# Patient Record
Sex: Female | Born: 1945 | Race: White | Hispanic: No | Smoking: Former smoker
Health system: Southern US, Community
[De-identification: ages and names within clinical notes are randomized; demographics above are authoritative.]

## PROBLEM LIST (undated history)

## (undated) DIAGNOSIS — R7303 Prediabetes: Secondary | ICD-10-CM

## (undated) DIAGNOSIS — E669 Obesity, unspecified: Secondary | ICD-10-CM

## (undated) DIAGNOSIS — I10 Essential (primary) hypertension: Secondary | ICD-10-CM

## (undated) DIAGNOSIS — Z860100 Personal history of colon polyps, unspecified: Secondary | ICD-10-CM

## (undated) DIAGNOSIS — T7840XA Allergy, unspecified, initial encounter: Secondary | ICD-10-CM

## (undated) DIAGNOSIS — M797 Fibromyalgia: Secondary | ICD-10-CM

## (undated) DIAGNOSIS — G894 Chronic pain syndrome: Secondary | ICD-10-CM

## (undated) DIAGNOSIS — E039 Hypothyroidism, unspecified: Secondary | ICD-10-CM

## (undated) DIAGNOSIS — N390 Urinary tract infection, site not specified: Secondary | ICD-10-CM

## (undated) DIAGNOSIS — M1990 Unspecified osteoarthritis, unspecified site: Secondary | ICD-10-CM

## (undated) HISTORY — DX: Obesity, unspecified: E66.9

## (undated) HISTORY — DX: Hypothyroidism, unspecified: E03.9

## (undated) HISTORY — DX: Essential (primary) hypertension: I10

## (undated) HISTORY — DX: Allergy, unspecified, initial encounter: T78.40XA

## (undated) HISTORY — DX: Personal history of colonic polyps: Z86.010

## (undated) HISTORY — DX: Unspecified osteoarthritis, unspecified site: M19.90

## (undated) HISTORY — DX: Urinary tract infection, site not specified: N39.0

## (undated) HISTORY — DX: Personal history of colon polyps, unspecified: Z86.0100

## (undated) HISTORY — DX: Chronic pain syndrome: G89.4

## (undated) HISTORY — DX: Fibromyalgia: M79.7

---
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
## 1980-03-03 HISTORY — PX: CHOLECYSTECTOMY: SHX55

## 1983-03-04 HISTORY — PX: TUBAL LIGATION: SHX77

## 1988-03-03 HISTORY — PX: ABDOMINAL HYSTERECTOMY: SHX81

## 1988-03-03 HISTORY — PX: DILATION AND CURETTAGE OF UTERUS: SHX78

## 1990-03-03 HISTORY — PX: KNEE ARTHROSCOPY: SHX127

## 1990-03-03 HISTORY — PX: FOOT SURGERY: SHX648

## 1997-10-16 ENCOUNTER — Ambulatory Visit (HOSPITAL_COMMUNITY): Admission: RE | Admit: 1997-10-16 | Discharge: 1997-10-16 | Payer: Self-pay | Admitting: Gastroenterology

## 1998-08-27 ENCOUNTER — Encounter: Payer: Self-pay | Admitting: Family Medicine

## 1998-08-27 ENCOUNTER — Ambulatory Visit (HOSPITAL_COMMUNITY): Admission: RE | Admit: 1998-08-27 | Discharge: 1998-08-27 | Payer: Self-pay | Admitting: Family Medicine

## 1999-06-19 ENCOUNTER — Encounter: Payer: Self-pay | Admitting: Sports Medicine

## 1999-06-19 ENCOUNTER — Encounter: Admission: RE | Admit: 1999-06-19 | Discharge: 1999-06-19 | Payer: Self-pay | Admitting: Sports Medicine

## 2000-02-06 ENCOUNTER — Inpatient Hospital Stay: Admission: EM | Admit: 2000-02-06 | Discharge: 2000-02-09 | Payer: Self-pay | Admitting: Family Medicine

## 2000-08-17 ENCOUNTER — Encounter: Admission: RE | Admit: 2000-08-17 | Discharge: 2000-08-17 | Payer: Self-pay | Admitting: Infectious Diseases

## 2000-09-23 ENCOUNTER — Encounter: Admission: RE | Admit: 2000-09-23 | Discharge: 2000-09-23 | Payer: Self-pay | Admitting: Infectious Diseases

## 2002-07-07 ENCOUNTER — Ambulatory Visit (HOSPITAL_COMMUNITY): Admission: RE | Admit: 2002-07-07 | Discharge: 2002-07-07 | Payer: Self-pay | Admitting: Cardiology

## 2006-01-08 ENCOUNTER — Ambulatory Visit (HOSPITAL_COMMUNITY): Admission: RE | Admit: 2006-01-08 | Discharge: 2006-01-08 | Payer: Self-pay | Admitting: Gastroenterology

## 2006-02-06 ENCOUNTER — Ambulatory Visit (HOSPITAL_BASED_OUTPATIENT_CLINIC_OR_DEPARTMENT_OTHER): Admission: RE | Admit: 2006-02-06 | Discharge: 2006-02-06 | Payer: Self-pay | Admitting: Urology

## 2009-01-15 ENCOUNTER — Encounter: Admission: RE | Admit: 2009-01-15 | Discharge: 2009-01-15 | Payer: Self-pay | Admitting: Endocrinology

## 2009-03-04 ENCOUNTER — Ambulatory Visit: Payer: Self-pay | Admitting: Family Medicine

## 2009-03-04 LAB — CONVERTED CEMR LAB
Bilirubin Urine: NEGATIVE
Blood in Urine, dipstick: NEGATIVE
Glucose, Urine, Semiquant: NEGATIVE
Ketones, urine, test strip: NEGATIVE
Nitrite: NEGATIVE
Protein, U semiquant: NEGATIVE
Specific Gravity, Urine: 1.015
Urobilinogen, UA: 0.2
pH: 5.5

## 2009-04-30 ENCOUNTER — Encounter: Payer: Self-pay | Admitting: Internal Medicine

## 2009-05-15 ENCOUNTER — Ambulatory Visit: Payer: Self-pay | Admitting: Internal Medicine

## 2009-05-15 DIAGNOSIS — Z860100 Personal history of colon polyps, unspecified: Secondary | ICD-10-CM | POA: Insufficient documentation

## 2009-05-15 DIAGNOSIS — E039 Hypothyroidism, unspecified: Secondary | ICD-10-CM | POA: Insufficient documentation

## 2009-05-15 DIAGNOSIS — J309 Allergic rhinitis, unspecified: Secondary | ICD-10-CM | POA: Insufficient documentation

## 2009-05-15 DIAGNOSIS — I10 Essential (primary) hypertension: Secondary | ICD-10-CM | POA: Insufficient documentation

## 2009-05-15 DIAGNOSIS — R300 Dysuria: Secondary | ICD-10-CM | POA: Insufficient documentation

## 2009-05-15 LAB — CONVERTED CEMR LAB
Bilirubin Urine: NEGATIVE
Glucose, Urine, Semiquant: NEGATIVE
Ketones, urine, test strip: NEGATIVE
Nitrite: NEGATIVE
Specific Gravity, Urine: 1.025
Urobilinogen, UA: 0.2
pH: 5

## 2009-05-28 ENCOUNTER — Telehealth: Payer: Self-pay | Admitting: Internal Medicine

## 2010-03-24 ENCOUNTER — Encounter: Payer: Self-pay | Admitting: Family Medicine

## 2010-04-04 NOTE — Assessment & Plan Note (Signed)
Summary: Frequent, burning urination x 8 dys rm2   Vital Signs:  Patient Profile:   65 Years Old Female CC:      Frequent, burning  urination x 8 dys  Height:     67 inches Weight:      251 pounds O2 Sat:      100 % O2 treatment:    Room Air Temp:     97 degrees F oral Pulse rate:   76 / minute Pulse rhythm:   regular Resp:     16 per minute BP sitting:   144 / 87  (right arm) Cuff size:   regular  Vitals Entered By: Areta Haber CMA (March 04, 2009 3:56 PM)                  Prior Medication List:  No prior medications documented  Current Allergies: No known allergies History of Present Illness Chief Complaint: Frequent, burning  urination x 8 dys  History of Present Illness: Was seen by her PCP just before Christmas. She reports no significant improvement. Only w. taking Azo does the symptoms get better at all. She has used cipro before but usually needs a full 7-10 days of treatment when shee does that.  Current Problems: URINARY TRACT INFECTION (ICD-599.0)   Current Meds OXYCONTIN 40 MG XR12H-TAB (OXYCODONE HCL) 2 tab by mouth once daily ARMOUR THYROID 90 MG TABS (THYROID) 1 tab by mouth once daily MICARDIS 20 MG TABS (TELMISARTAN) 1 tab by mouth once daily CIPRO 500 MG TABS (CIPROFLOXACIN HCL) 1 by mouth 2 times daily PYRIDIUM 100 MG TABS (PHENAZOPYRIDINE HCL) 1 po 3x aday  REVIEW OF SYSTEMS Constitutional Symptoms      Denies fever, chills, night sweats, weight loss, weight gain, and fatigue.  Eyes       Denies change in vision, eye pain, eye discharge, glasses, contact lenses, and eye surgery. Ear/Nose/Throat/Mouth       Denies hearing loss/aids, change in hearing, ear pain, ear discharge, dizziness, frequent runny nose, frequent nose bleeds, sinus problems, sore throat, hoarseness, and tooth pain or bleeding.  Respiratory       Denies dry cough, productive cough, wheezing, shortness of breath, asthma, bronchitis, and emphysema/COPD.  Cardiovascular       Denies murmurs, chest pain, and tires easily with exhertion.    Gastrointestinal       Denies stomach pain, nausea/vomiting, diarrhea, constipation, blood in bowel movements, and indigestion. Genitourniary       Complains of painful urination.      Denies kidney stones and loss of urinary control.      Comments: Frequent x 8 dys Neurological       Denies paralysis, seizures, and fainting/blackouts. Musculoskeletal       Denies muscle pain, joint pain, joint stiffness, decreased range of motion, redness, swelling, muscle weakness, and gout.  Skin       Denies bruising, unusual mles/lumps or sores, and hair/skin or nail changes.  Psych       Denies mood changes, temper/anger issues, anxiety/stress, speech problems, depression, and sleep problems. Other Comments: Pt has not followed up with PCP.   Past History:  Family History: Last updated: 03/04/2009 Family History Hypertension Family History Thyroid disease  Social History: Last updated: 03/04/2009 Married Never Smoked Alcohol use-no Drug use-no Regular exercise-no  Risk Factors: Exercise: no (03/04/2009)  Risk Factors: Smoking Status: never (03/04/2009)  Past Medical History: Fibromyalgis Arthritis  Past Surgical History: Caesarean section Cholecystectomy Tonsillectomy Foot,  knee  Appendectomy Hysterectomy  Family History: Reviewed history and no changes required. Family History Hypertension Family History Thyroid disease  Social History: Reviewed history and no changes required. Married Never Smoked Alcohol use-no Drug use-no Regular exercise-no Smoking Status:  never Drug Use:  no Does Patient Exercise:  no Physical Exam General appearance: well developed, well nourished, no acute distress Abdomen: just mild tenderness over the bladder Skin: no obvious rashes or lesions MSE: oriented to time, place, and person Assessment New Problems: URINARY TRACT INFECTION  (ICD-599.0)  UTI  Plan New Medications/Changes: PYRIDIUM 100 MG TABS (PHENAZOPYRIDINE HCL) 1 po 3x aday  #15 x 0, 03/04/2009, Hassan Rowan MD CIPRO 500 MG TABS (CIPROFLOXACIN HCL) 1 by mouth 2 times daily  #14 x 0, 03/04/2009, Hassan Rowan MD  New Orders: New Patient Level III [phone] UA Dipstick w/o Micro (manual) [81002] T-Culture, Urine [[id number]] Planning Comments:   AS BELOW  Follow Up: Follow up in 2-3 days if no improvement, Follow up on an as needed basis, Follow up with Primary Physician  The patient and/or caregiver has been counseled thoroughly with regard to medications prescribed including dosage, schedule, interactions, rationale for use, and possible side effects and they verbalize understanding.  Diagnoses and expected course of recovery discussed and will return if not improved as expected or if the condition worsens. Patient and/or caregiver verbalized understanding.  Prescriptions: PYRIDIUM 100 MG TABS (PHENAZOPYRIDINE HCL) 1 po 3x aday  #15 x 0   Entered and Authorized by:   Hassan Rowan MD   Signed by:   Hassan Rowan MD on 03/04/2009   Method used:   Print then Give to Patient   RxID:   [id number] CIPRO 500 MG TABS (CIPROFLOXACIN HCL) 1 by mouth 2 times daily  #14 x 0   Entered and Authorized by:   Hassan Rowan MD   Signed by:   Hassan Rowan MD on 03/04/2009   Method used:   Print then Give to Patient   RxID:   [id number]   Patient Instructions: 1)  Please schedule a follow-up appointment as needed. 2)  Please schedule an appointment with your primary doctor in :7-10 DAYS 3)  Take your antibiotic as prescribed until ALL of it is gone, but stop if you develop a rash or swelling and contact our office as soon as possible.  Laboratory Results   Urine Tests  Date/Time Received: March 04, 2009 4:37 PM  Date/Time Reported: March 04, 2009 4:37 PM   Routine Urinalysis   Color: yellow Appearance: Clear Glucose: negative   (Normal Range:  Negative) Bilirubin: negative   (Normal Range: Negative) Ketone: negative   (Normal Range: Negative) Spec. Gravity: 1.015   (Normal Range: 1.003-1.035) Blood: negative   (Normal Range: Negative) pH: 5.5   (Normal Range: 5.0-8.0) Protein: negative   (Normal Range: Negative) Urobilinogen: 0.2   (Normal Range: 0-1) Nitrite: negative   (Normal Range: Negative) Leukocyte Esterace: small   (Normal Range: Negative)

## 2010-04-04 NOTE — Assessment & Plan Note (Signed)
Summary: to be est/njr   Vital Signs:  Patient profile:   65 year old female Height:      66.5 inches Weight:      250 pounds BMI:     39.89 Temp:     97.9 degrees F oral BP sitting:   110 / 78  (left arm) Cuff size:   large  Vitals Entered By: Duard Brady LPN (May 15, 2009 9:38 AM) CC: new pt to establish-c/o bruning with urination   Hx UTI   . last pap 2010 Norn , last mammo 2009 [facility].  Is Patient Diabetic? No   CC:  new pt to establish-c/o bruning with urination   Hx UTI   . last pap 2010 Norn  and last mammo 2009 [facility]. Marland Kitchen  History of Present Illness: 65 year old patient who is seen today to establish with our practice.  She is followed by pain management for fibromyalgia and osteoarthritis.  She has a history of treated hypertension, hypothyroidism, obesity, osteoporosis.  Preventive Screening-Counseling & Management  Alcohol-Tobacco     Smoking Status: never  Allergies (verified): No Known Drug Allergies  Past History:  Past Medical History: Fibromyalgis/chronic pain syndrome ([place]) Arthritis ([place]) Colonic polyps, hx of Hypertension obesity chronic UTI -urology Allergic rhinitis Hypothyroidism  Past Surgical History: Caesarean section Cholecystectomy 83 Tonsillectomy 60 Foot,  knee 89 Appendectomy 66 Hysterectomy 89 colonoscopy 2007  Family History: Reviewed history from 03/04/2009 and no changes required. Family History Hypertension Family History Thyroid disease  mother died age 53, pancreatic cancer father died age 39 hematologic malignancy  Two sisters unremarkable health except for arthritis  Social History: Reviewed history from 03/04/2009 and no changes required. Married Never Smoked-DC tobacco, age 65 Alcohol use-no Drug use-no Regular exercise-no 6 children, 21 grandchildren  Review of Systems       The patient complains of weight gain and difficulty walking.  The patient denies anorexia, fever, weight loss, vision loss,  decreased hearing, hoarseness, chest pain, syncope, dyspnea on exertion, peripheral edema, prolonged cough, headaches, hemoptysis, abdominal pain, melena, hematochezia, severe indigestion/heartburn, hematuria, incontinence, genital sores, muscle weakness, suspicious skin lesions, transient blindness, depression, unusual weight change, abnormal bleeding, enlarged lymph nodes, angioedema, and breast masses.    Physical Exam  General:  overweight-appearing.  140/80 Head:  Normocephalic and atraumatic without obvious abnormalities. No apparent alopecia or balding. Eyes:  No corneal or conjunctival inflammation noted. EOMI. Perrla. Funduscopic exam benign, without hemorrhages, exudates or papilledema. Vision grossly normal. Ears:  External ear exam shows no significant lesions or deformities.  Otoscopic examination reveals clear canals, tympanic membranes are intact bilaterally without bulging, retraction, inflammation or discharge. Hearing is grossly normal bilaterally. Nose:  External nasal examination shows no deformity or inflammation. Nasal mucosa are pink and moist without lesions or exudates. Mouth:  Oral mucosa and oropharynx without lesions or exudates.  Teeth in good repair. Neck:  No deformities, masses, or tenderness noted. Chest Brandis:  No deformities, masses, or tenderness noted. Lungs:  Normal respiratory effort, chest expands symmetrically. Lungs are clear to auscultation, no crackles or wheezes. Heart:  Normal rate and regular rhythm. S1 and S2 normal without gallop, murmur, click, rub or other extra sounds. Abdomen:  Bowel sounds positive,abdomen soft and non-tender without masses, organomegaly or hernias noted. Msk:  No deformity or scoliosis noted of thoracic or lumbar spine.   Pulses:  R and L carotid,radial,femoral,dorsalis pedis and posterior tibial pulses are full and equal bilaterally Extremities:  No clubbing, cyanosis, edema, or deformity noted with normal  full range of motion  of all joints.   Neurologic:  alert & oriented X3, strength normal in all extremities, sensation intact to pinprick, and gait normal.   Skin:  Intact without suspicious lesions or rashes Cervical Nodes:  No lymphadenopathy noted Axillary Nodes:  No palpable lymphadenopathy Inguinal Nodes:  No significant adenopathy Psych:  Cognition and judgment appear intact. Alert and cooperative with normal attention span and concentration. No apparent delusions, illusions, hallucinations   Impression & Recommendations:  Problem # 1:  HYPOTHYROIDISM (ICD-244.9)  Her updated medication list for this problem includes:    Armour Thyroid 90 Mg Tabs (Thyroid) .Marland Kitchen... 1 tab by mouth once daily  Problem # 2:  HYPERTENSION (ICD-401.9)  The following medications were removed from the medication list:    Micardis 20 Mg Tabs (Telmisartan) .Marland Kitchen... 1 tab by mouth once daily Her updated medication list for this problem includes:    Micardis 40 Mg Tabs (Telmisartan) .Marland Kitchen... 1/2 daily  Problem # 3:  COLONIC POLYPS, HX OF (ICD-V12.72)  Problem # 4:  DYSURIA (ICD-788.1)  Orders: UA Dipstick w/o Micro (manual) (16109)  Her updated medication list for this problem includes:    Ciprofloxacin Hcl 500 Mg Tabs (Ciprofloxacin hcl) ..... One twice daily  Complete Medication List: 1)  Oxycontin 40 Mg Xr12h-tab (Oxycodone hcl) .... 2 tab by mouth once daily 2)  Armour Thyroid 90 Mg Tabs (Thyroid) .Marland Kitchen.. 1 tab by mouth once daily 3)  Pyridium 100 Mg Tabs (Phenazopyridine hcl) .Marland Kitchen.. 1 po 3x aday 4)  Ciprofloxacin Hcl 500 Mg Tabs (Ciprofloxacin hcl) .... One twice daily 5)  Micardis 40 Mg Tabs (Telmisartan) .... 1/2 daily  Patient Instructions: 1)  Limit your Sodium (Salt). 2)  It is important that you exercise regularly at least 20 minutes 5 times a week. If you develop chest pain, have severe difficulty breathing, or feel very tired , stop exercising immediately and seek medical attention. 3)  You need to lose weight.  Consider a lower calorie diet and regular exercise.  4)  Check your Blood Pressure regularly. If it is above: 150/90 you should make an appointment. 5)  Please schedule a follow-up appointment in 4 months. Prescriptions: MICARDIS 40 MG TABS (TELMISARTAN) 1/2 daily  #90 x 6   Entered and Authorized by:   Gordy Savers  MD   Signed by:   Gordy Savers  MD on 05/15/2009   Method used:   Print then Give to Patient   RxID:   [id number] PYRIDIUM 100 MG TABS (PHENAZOPYRIDINE HCL) 1 po 3x aday  #15 x 0   Entered and Authorized by:   Gordy Savers  MD   Signed by:   Gordy Savers  MD on 05/15/2009   Method used:   Print then Give to Patient   RxID:   [id number] ARMOUR THYROID 90 MG TABS (THYROID) 1 tab by mouth once daily  #90 x 6   Entered and Authorized by:   Gordy Savers  MD   Signed by:   Gordy Savers  MD on 05/15/2009   Method used:   Print then Give to Patient   RxID:   [id number] MICARDIS 40 MG TABS (TELMISARTAN) 1/2 daily  #90 x 6   Entered and Authorized by:   Gordy Savers  MD   Signed by:   Gordy Savers  MD on 05/15/2009   Method used:   [facility] to        [facility]  [address] [phone]* (retail)       [address] [place], [place]  [address]       Ph: [phone] or [phone]       Fax: [phone]   RxID:   [id number] CIPROFLOXACIN HCL 500 MG TABS (CIPROFLOXACIN HCL) one twice daily  #14 x 0   Entered and Authorized by:   Gordy Savers  MD   Signed by:   Gordy Savers  MD on 05/15/2009   Method used:   Print then Give to Patient   RxID:   [id number] PYRIDIUM 100 MG TABS (PHENAZOPYRIDINE HCL) 1 po 3x aday  #15 x 0   Entered and Authorized by:   Gordy Savers  MD   Signed by:   Gordy Savers  MD on 05/15/2009   Method used:   [facility]ically to        [facility] [phone]* (retail)       [address] [place], [place]  [address]       Ph: [phone] or  [phone]       Fax: [phone]   RxID:   [id number] ARMOUR THYROID 90 MG TABS (THYROID) 1 tab by mouth once daily  #90 x 6   Entered and Authorized by:   Gordy Savers  MD   Signed by:   Gordy Savers  MD on 05/15/2009   Method used:   [facility]ically to        [facility] [phone]* (retail)       [address] [place], [place]  [address]       Ph: [phone] or [phone]       Fax: [phone]   RxID:   [id number]   Laboratory Results   Urine Tests  Date/Time Received: May 15, 2009 9:44 AM Date/Time Reported: May 15, 2009 9:44 AM  Routine Urinalysis   Color: yellow Appearance: Cloudy Glucose: negative   (Normal Range: Negative) Bilirubin: negative   (Normal Range: Negative) Ketone: negative   (Normal Range: Negative) Spec. Gravity: 1.025   (Normal Range: 1.003-1.035) Blood: moderate   (Normal Range: Negative) pH: 5.0   (Normal Range: 5.0-8.0) Protein: trace   (Normal Range: Negative) Urobilinogen: 0.2   (Normal Range: 0-1) Nitrite: negative   (Normal Range: Negative) Leukocyte Esterace: moderate   (Normal Range: Negative)

## 2010-04-04 NOTE — Progress Notes (Signed)
Summary:  uti - cipro rx'd       Additional Follow-up for Phone Call Additional follow up Details #2::    called pt at home # - still with c/o uti , states she has appt with urology on 4/14 , in the past has had to be on abx tx for 2 weeks . I will infom Dr.kwaitkowski and see if he wants to call out something or does she need to be seen . Pt said she could not come in for an appt. KIK Follow-up by: Duard Brady LPN,  May 28, 2009 11:36 AM  Additional Follow-up for Phone Call Additional follow up Details #3:: Details for Additional Follow-up Action Taken: spoke with dr. Amador Cunas - ok'd refill on cipro - [facility] [place] - pt not at home - spoke with husband to let know rx filled. KIK Additional Follow-up by: Duard Brady LPN,  May 28, 2009 2:45 PM  Prescriptions: CIPROFLOXACIN HCL 500 MG TABS (CIPROFLOXACIN HCL) one twice daily  #14 x 1   Entered by:   Duard Brady LPN   Authorized by:   Gordy Savers  MD   Signed by:   Duard Brady LPN on 29/56/2130   Method used:   [facility] to        [facility] [phone]* (retail)       [address] [place], [place]  [address]       Ph: [phone] or [phone]       Fax: [phone]   RxID:   [id number]    [facility] Triage Call Report Triage Record Num: [id number] Operator: Coralee [place] Patient Name: Jajaira Ruis Call Date & Time: 05/25/2009 7:06:33PM Patient Phone: PCP: Patient Gender: Female PCP Fax : Patient DOB: 1945/10/15 [facility] Name: [facility] Reason for Call: Pt calling tonight about completing Cipro for UTI. About 2 days after finishing the antibiotic the symptoms returned. Pt is unable to supply any dates for time frame of being off Cipro. She is requesting to be put back on Cipro. UC is available. Pt advised to be seen at [facility]. Pt sts"You should have told me that you were'nt going to call in Cipro before I wasted my time talking to you". RN apologized  for her inconvenience and pt disconnected the call before further advise could be given. Protocol(s) Used: Urinary Symptoms - Female Recommended Outcome per Protocol: See Provider within 24 hours Reason for Outcome: Urinary tract symptoms Care Advice: Increase intake of fluids. Try to drink 8 oz. (.2 liter) every hour when awake, including unsweetened cranberry juice, unless on restricted fluids for other medical reasons. Take sips of fluid or eat ice chips if nauseated or vomiting.  ~ Systemic Inflammatory Response Syndrome (SIRS): Watch for signs of a generalized, whole body infection. Occurs within days of a localized infection, especially of the urinary, GI, respiratory or nervous systems; or after a traumatic injury or invasive procedure. - Call EMS 911 if symptoms have worsened, such as increasing confusion or unusual drowsiness; cold and clammy skin; no urine output; rapid respiration (>30/min.) or slow respiration (<10/min.); struggling to breathe. - Go to the ED immediately for early symptoms of rapid pulse >90/min. or rapid breathing >20/min. at rest; chills; oral temperature >100.4 F (38 C) or <96.8 F (36 C) when associated with conditions noted.  ~  ~ SYMPTOM / CONDITION MANAGEMENT 05/25/2009 7:18:05PM Page 1 of 1 [id number]

## 2010-07-19 NOTE — H&P (Signed)
NAME:  Sally [name], Sally [name] NO.:  [id number]   MEDICAL RECORD NO.:  [id number]                   PATIENT TYPE:  OIB   LOCATION:                                       FACILITY:  [facility]   PHYSICIAN:  Peter M. [place]azil[place], M.D.               DATE OF BIRTH:  21-Mar-1945   DATE OF ADMISSION:  07/07/2002  DATE OF DISCHARGE:                                HISTORY & PHYSICAL   HISTORY OF PRESENT ILLNESS:  Sally [name] is a 65 year old white female with  multiple medical problems.  Was recently evaluated for symptoms of chest  pain.  She states the pain has occurred off and on over the past year,  associated with symptoms of fluttering in her chest and it seems to be much  worse when she is under periods of stress.  She does have a history of  hypertension.  She recently was evaluated with stress Cardiolite study and  this was significantly abnormal.  The patient had a markedly reduced  exercise tolerance, developing severe dyspnea early in Stage I of exercise.  She also developed increasing ventricular ectopy with short bursts of  nonsustained ventricular tachycardia.  She had 1 mm of ST segment depression  inferolaterally.  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  She  had normal ejection fraction, 64%.  Because of her symptoms of chest pain,  remarkably abnormal Cardiolite study, she is now admitted for cardiac  catheterization.   PAST MEDICAL AND SURGICAL HISTORY:  Significant for fibromyalgia,  osteoarthritis, history of rheumatic fever, history of hypertension,  hypothyroidism, obesity.  History of C-section x2, has had six previous  childbirths.  She is status post hysterectomy, cholecystectomy, appendectomy  and history of previous T&A.   ALLERGIES:  No known allergies.   CURRENT MEDICATIONS:  Vicodin 10/500 mg six tablets daily, aspirin 81 mg per  day, multivitamin a day,  Altace 10 mg per day, Synthroid 0.175 mg daily.   SOCIAL HISTORY:  Patient runs a daycare.  She is married.  She has six  children.  She denies tobacco or alcohol use.   FAMILY HISTORY:  Father is 13 and alive and well.  Mother died at age 39  with pancreatic cancer, also had a history of congestive heart failure.  One  sister has a history of arrhythmias.  One sister has hypertension.   REVIEW OF SYSTEMS:  Remarkable for chronic arthralgias, arthritis.  Denies  any bowel or bladder complaints at this time.  No history of TIA or stroke.  No history of edema, orthopnea, PND.  Other review of systems are negative.   PHYSICAL EXAMINATION:  GENERAL:  Patient is an obese white female in no  distress.  Weight 262 pounds.  VITAL SIGNS:  Blood pressure 140/100, pulse 74 and regular.  HEENT:  Pupils  equal, round and reactive to light and accommodation.  Extraocular movements are full.  Oropharynx is clear.  NECK:  Supple without JVD or adenopathy, thyromegaly or bruits.  LUNGS:  Clear to auscultation and percussion.  CARDIAC:  Regular rate and rhythm without murmurs, rubs, gallops or clicks.  ABDOMEN:  Soft, nontender.  There is obesity without hepatosplenomegaly,  masses or bruits.  EXTREMITIES:  Without edema.  Pulses are 2+ and symmetric.  NEUROLOGIC:  Nonfocal.   LABORATORY DATA:  Chest x-ray shows no active disease.  ECG shows normal  sinus rhythm with frequent PVC's, nonspecific ST-T wave abnormality.   IMPRESSIONS:  1. Angina pectoris.  2. Significantly abnormal stress Cardiolite study.  3. Hypertension.  4. Obesity.  5. Hypothyroidism.  6. History of fibromyalgia.   PLAN:  The patient will be admitted for cardiac catheterization with further  therapy pending these results.                                                  Peter M. [place], M.D.    PMJ/MEDQ  D:  07/04/2002  T:  07/05/2002  Job:  [id number]   cc:   Miguel Aschoff, M.D.  [address]   [place]  [place]  [address]  Fax: [phone]

## 2010-07-19 NOTE — Op Note (Signed)
Sally [name], Sally [name]                  ACCOUNT NO.:  [id number]   MEDICAL RECORD NO.:  [id number]            PATIENT TYPE:   LOCATION:                                 FACILITY:   PHYSICIAN:  Mark C. Vernie Ammons, M.D.       DATE OF BIRTH:   DATE OF PROCEDURE:  02/06/2006  DATE OF DISCHARGE:                               OPERATIVE REPORT   PREOP DIAGNOSIS:  Rule out IC.   POSTOP DIAGNOSIS:  Rule out IC.   PROCEDURE:  Cystoscopy with hydrodistention and urethral dilatation.   SURGEON:  Mark C. Vernie Ammons, M.D.   ANESTHESIA:  General.   BLOOD LOSS:  None.   DRAINS:  None.   SPECIMENS:  None.   COMPLICATIONS:  None.   INDICATIONS:  The patient has a history of UTIs and underwent prior  cystoscopy with significant relief of her voiding symptoms about 3-1/2  years ago.  She noted that the procedure helped tremendously.  She  currently now has dysuria and nocturia with the dysuria being worsened  by ascitic beverages; and associated frequency, urgency, and slowly  urinary stream.  She is brought to the OR, today, for cystoscopy,  hydrodistention, and urethral dilatation after understanding the risks,  complications, and alternatives.   DESCRIPTION OF OPERATION:  After informed consent, the patient was  brought to the major OR, placed on the table and administered general  anesthesia; and moved into the dorsal lithotomy position.  Her genitalia  were sterilely prepped and draped.  Initially a 22-French cystoscope  with a 12-degree lens was inserted into the bladder; and the bladder was  fully inspected.  It was noted to be free of any tumor, stones, or  inflammatory lesions.  Ureteral orifices normal configuration and  position with clear efflux.   Hydrodistention was then undertaken at 80 cm water pressure.  I then  drained the bladder and noted the volume to be 600 mL.  The terminal  effluent was clear.  Reinspection revealed no petechial hemorrhages or  glomerulations.  I, therefore,  drained the bladder; and performed  urethral dilatation with female sounds to 26-French without difficulty.  She received 30 mg of Toradol IV; and was awakened and taken to recovery  room in stable satisfactory condition.  She tolerated the procedure well with no intraoperative complications.  She will be given a prescription for 28 Tylox should she have any  significant pain, and 38 Pyridium Plus.  I have instructed her to return  to the office; if she were continues to remain symptomatic; otherwise  she will return as needed.      Mark C. Vernie Ammons, M.D.  Electronically Signed     MCO/MEDQ  D:  02/06/2006  T:  02/06/2006  Job:  [id number]

## 2010-07-19 NOTE — Op Note (Signed)
NAME:  Sally [name], Sally [name]                  ACCOUNT NO.:  [id number]   MEDICAL RECORD NO.:  [id number]          PATIENT TYPE:  AMB   LOCATION:  ENDO                         FACILITY:  [facility]   PHYSICIAN:  John C. Madilyn Fireman, M.D.    DATE OF BIRTH:  October 10, 1945   DATE OF PROCEDURE:  01/08/2006  DATE OF DISCHARGE:                                 OPERATIVE REPORT   PROCEDURE:  Colonoscopy.   INDICATIONS FOR PROCEDURE:  History of colon polyps   PROCEDURE:  The patient was placed in the left lateral decubitus position  and placed on the pulse monitor with continuous low-flow oxygen delivered by  nasal cannula.  Sedation was provided by the anesthesia department and was  based on propofol, fentanyl and Versed and is documented elsewhere.  The  Olympus video colonoscope was inserted into the rectum and advanced to the  cecum, confirmed by intubation of terminal ileum was visualization of  ileocecal valve and appendiceal orifice.  Prep was excellent.  The colon was  very tortuous, especially in the more proximal areas.  The cecum, ascending,  transverse, descending, sigmoid and rectum otherwise all appeared normal  with no masses, polyps, diverticula or other mucosal abnormalities.  Scope  was then withdrawn and the patient returned to the recovery room in stable  condition.  She tolerated the procedure well.  There were no immediate  complications.   IMPRESSION:  1. Normal study.   PLAN:  Repeat study in 5 years will likely need propofol sedation again.           ______________________________  Everardo All. Madilyn Fireman, M.D.     JCH/MEDQ  D:  01/08/2006  T:  01/08/2006  Job:  [id number]   cc:   Miguel Aschoff, M.D.  Fax: [phone]

## 2010-07-19 NOTE — Discharge Summary (Signed)
[facility]  Patient:    FAVOR, KREH                         MRN: [id number] Adm. Date:  [id number] Disc. Date: [id number] Attending:  Anastasio Auerbach CC:         Miguel Aschoff, M.D.                           Discharge Summary  DATE OF BIRTH:                [birth year]  DISCHARGE DIAGNOSES:          1. Viral gastroenteritis.                                  a. Severe vomiting and diarrhea, resolved.                               2. Dehydration.                               3. Hypokalemia secondary to dehydration.                               4. Gastroesophageal reflux disease.                                  a. ?Aggravated by prednisone.                               5. Rheumatic fever diagnosed January 21, 2000.                                  a. Criteria:  Arthritis and fever with                                     elevated ASO titers and symptomatic strep                                     throat prior.                                  b. Monthly intramuscular penicillin given                                     November 20.                                  c. On prednisone taper.  6. Status post total abdominal hysterectomy.                               7. Status post tonsillectomy.                               8. Status post appendectomy.                               9. Cesarean section x 2.  ALLERGIES:                    DARVOCET, TYLENOL WITH CODEINE, NONSTEROIDALS (stomach pain).  DISCHARGE MEDICATIONS:        1. (New) Protonix 40 mg q.d.                               2. Continue prednisone taper as Dr. Tenny Craw had                                  prescribed (20 mg per day now).                               3. Multivitamin daily.                               4. Vicodin 5/500 one two to q.4h. p.r.n. pain                                  (maximum eight per day).  I gave her 30 with   no refills.                               5. Ultram 50 mg one to two q.6h. p.r.n. pain.  CONDITION ON DISCHARGE:       Stable.  Ambulating without difficulty.  DISPOSITION:                  Home with family.  RECOMMENDED ACTIVITY:         As tolerated.  No strenuous.  No driving while on narcotic pain medications.  RECOMMENDED DIET:             Low fat, lactose-free x 5 days.  SPECIAL INSTRUCTIONS:         The patient was instructed to resume clear liquid diet if she became nauseated again and contact Dr. Tenny Craw if she had vomiting or more diarrhea.  FOLLOW-UP:                    The patient is to keep her appointment with Dr. Tenny Craw on Tuesday, December 11.  A potassium should be rechecked at that visit and 2-D echo done here should be followed up.  A copy can be faxed from the hospital by calling [phone].  She should also have her right first finger checked.  On discharge she had some mild redness at the edge of the nail, possibly ingrown.  Recommended  soaks and topical Bacitracin.  CONSULTANTS:                  None.  PROCEDURE:                    EKG:  Normal sinus rhythm with occasional premature ventricular complex, PR interval 160 (normal), nonspecific ST changes.  HOSPITAL COURSE:              #1- VIRAL GASTROENTERITIS:  Mrs. Carboni is a 65 year old Caucasian female recently diagnosed with rheumatic fever.  She presents with a one day history of severe vomiting and diarrhea and was found to be orthostatic in the outpatient clinic and was referred for admission. Because of recent antibiotic use, a C-difficile was ordered which was negative.  She was hydrated aggressively and over the next 72 hours her diarrhea resolved as did her nausea.  She was discharged ambulating and tolerating a lactose-free bland diet.                                #2 - DEHYDRATION AND HYPOKALEMIA:  She was aggressively hydrated and potassium was replaced orally.  She tolerated this and on the day  prior to discharge her potassium was 3.5 with a BUN of 7 and a creatinine of 0.8.                                #3 - GASTROESOPHAGEAL REFLUX DISEASE:  Mrs. Turpen had symptoms of heartburn here.  I did add Protonix and I would continue this while she is on the prednisone.  Stool guaiac was not done but her hemoglobin was normal at 13.9 with an MCV of 85.                                #4 - RHEUMATIC FEVER:  Diagnosed on November 20 based on post strep arthralgia and fevers.  This admission she did have a sedimentation rate which was 26 and an ASO titer which was elevated at 734. She received intramuscular penicillin on November 20 and has been on a prednisone taper.  Dr. Tenny Craw is managing this.  EKG done here showed a normal PR interval with no significant changes from previous.  A 2-D echo was done. She has no murmur.  The results should be available by her follow-up appointment on Tuesday.  Dr. Tenny Craw can contact the [facility] echo laboratory to have these faxed. DD:  02/09/00 TD:  02/09/00 Job: [id number]

## 2010-07-19 NOTE — Cardiovascular Report (Signed)
   [name], [name]                            ACCOUNT NO.:  [id number]   MEDICAL RECORD NO.:  [id number]                   PATIENT TYPE:  OIB   LOCATION:  2899                                 FACILITY:  [facility]   PHYSICIAN:  Peter M. Swaziland, M.D.               DATE OF BIRTH:  [birth year]   DATE OF PROCEDURE:  07/07/2002  DATE OF DISCHARGE:  07/07/2002                              CARDIAC CATHETERIZATION   PROCEDURE:  Cardiac catheterization.   CARDIOLOGIST:  Peter M. Swaziland, M.D.   INDICATIONS:  The patient is a 65 year old white female who presents with  symptoms of chest pain.  Stress Cardiolite study has suggested both anterior  and inferior Pfannenstiel ischemia.   ACCESS:  Via the right femoral artery using the standard Seldinger  technique.   EQUIPMENT:  6 French 4 cm right and left Judkins catheter, 6 French pigtail  catheter, 6 French arterial sheath.   MEDICATIONS:  Local anesthesia with 1% Xylocaine, Versed 3 mg IV, fentanyl  25 mg IV.   CONTRAST:  Omnipaque 140 mL.   HEMODYNAMIC DATA:  Aortic pressure is 149/85 with a mean of 115.  Left  ventricular is 149 with and EDP of 11 mmHg.   ANGIOGRAPHIC DATA:  The left coronary artery arises and distributes  normally.   The left main coronary artery is normal.   The left anterior descending artery and its branches are normal.   The left circumflex coronary artery is normal.   The right coronary artery is a dominant vessel and is normal.   LEFT VENTRICULAR ANGIOGRAPHY:  The left ventricular angiography is performed  in the RAO view.  This demonstrates normal left ventricular size and  contractility with normal systolic function.  The ejection fraction is  estimated at 65%.  There is no mitral regurgitation or prolapse.  Aortic valve appears normal.    FINAL INTERPRETATION:  1. Normal coronary anatomy.  2. Normal left ventricular function.                                               Peter M. Swaziland, M.D.    PMJ/MEDQ  D:  07/07/2002  T:  07/08/2002  Job:  [id number]   cc:   C. Duane Lope, M.D.  [address]  [place]  [place] [address]  Fax: [phone]

## 2010-07-19 NOTE — H&P (Signed)
[facility]  Patient:    Sally [name], Sally [name]                         MRN: [id number] Adm. Date:  [id number] Attending:  Daisy Floro                         History and Physical  CHIEF COMPLAINT:  Vomiting.  HISTORY OF PRESENT ILLNESS:  Patient had Strep diagnosed and developed rheumatic fever.  She has been on prednisone for the past couple of weeks and started vomiting last night.  It was severe, could not stand up and vomiting came in spells where she would go for 30 minutes straight.  It occurred again today.  She had almost nothing down and by this evening, she was brought in for evaluation at the office, found to be orthostatic and recommended for admission.  Phenergan suppositories helped partially at home.  PAST MEDICAL HISTORY:  TAH.  Recently diagnosed with rheumatic fever, as mentioned above.  She got a shot of Bicillin and has been on prednisone, trying to reduce the severe joint inflammation she had.  Status post tonsillectomy, appendectomy and C-section x 2.  MEDICATIONS:  Prednisone taper, down to 20 mg a day now; it was a 15-day taper today.  Vicodin or Ultram p.r.n. for pain.  Multivitamin.  ALLERGIES:  DARVOCET-N, TYLENOL WITH CODEINE, NSAIDS that cause stomach pain. She thinks she can take Vioxx and the PREDNISONE gave her some side-effects of central nervous system activation when she first took it.  SOCIAL HISTORY:  Married 33 years.  Lives with her husband, son and daughter. No tobacco.  No alcohol.  No drugs.  She has six children ranging from 16 up to 25 years of age.  FAMILY HISTORY:  Mother died of uncertain causes but had what sounds like metastatic pancreatic cancer with PVTs, strokes, etc, caused by that prior to her demise.  Father has severe anemia that sounds like hemolytic anemia.  He gets transfusions for it now.  REVIEW OF SYSTEMS:  Patient has been fatigued for about six months.  She has had borderline blood  pressure.  She had a Pap about two years ago and is due for a mammogram.  Tetanus booster in 1996.  She says she hurts all over and says it is worse than usual, "times 10,000."  That was while she was sick but the pain has significantly resolved on the prednisone and she has had very little in the way of the large joint aching that had been present.  She never had any small joint problem with it.  Any old injury she had, sacroiliac or shoulder, really flared up with the inflammation.  She has a history of bladder infections but has not had one for about a year now.  She developed a rash on her lower abdomen after being on several different antibiotics that got better with OTC Lamisil.  Bowels had been fine until last night, she had slight diarrhea; digestive was normal until last night as well.  She developed a fever of 103 degrees today and had been running up to 101 or so during the early stages of the rheumatic fever but that had resolved too on the prednisone.  She developed pain in her low back today after the vomiting and her joints had been better and the fever also, as mentioned, until last night.  PHYSICAL EXAMINATION  VITAL SIGNS:  At our office today, the blood pressure was 90/60, pulse of 88, lying down.  Temperature was 97.8.  In seated position, her blood pressure dropped to 80/60, pulse went up to 100 and on standing, it went down to 76/60, pulse again of 100.  Time line is included on the chart of the treatment course.  The prednisone started, I believe, on January 27, 2000.  On exam here at [facility], temperature is 99.4, pulse 74, respirations 18, blood pressure 145/60.  O2 is 95% saturated.  GENERAL:  She is in no acute distress lying down, fairly pale, hurts to move.  HEENT:  [place]/AT.  TMs and OP are clear.  PERRL with sharp disks.  NECK:  No thyromegaly, adenopathy or mass.  Carotids 2+.  HEART:  RRR without murmur, rub or gallop.  LUNGS:  CTA.  ABDOMEN:   Diffusely tender, mainly in the periumbilical region.  No hepatosplenomegaly or mass.  EXTREMITIES:  No clubbing, cyanosis, or edema.  DP and radial pulses 2+.  BACK:  The back is tender over the low back musculature, both sides.  GU:  Deferred.  RECTAL:  Deferred.  BREASTS:  Deferred.  LABORATORY AND X-RAY FINDINGS:  Labs are pending from the hospitalization.  Recent ECG and chest x-ray were normal but no echocardiogram has been done.  IMPRESSION AND PLAN 1. Nausea, vomiting and orthostasis:  We will give her intravenous fluids,    clear liquids and observe progress.  Check a CMET. 2. Recent rheumatic fever became asymptomatic on prednisone.  Would like to    stop it, by patient request, and start her on Vioxx 25 mg one a day.  Check    echocardiogram tomorrow.  Recheck sedimentation rate, antistreptolysin-O    titer and complete blood count.  For the back pain which is probably just    due to the vomiting, we will use Demerol intravenously for current pain    control and possibly transition to something orally soon. DD:  02/06/00 TD:  02/07/00 Job: [id number]

## 2010-07-24 ENCOUNTER — Ambulatory Visit: Payer: Federal, State, Local not specified - PPO | Attending: Sports Medicine | Admitting: Physical Therapy

## 2010-07-24 DIAGNOSIS — M25519 Pain in unspecified shoulder: Secondary | ICD-10-CM | POA: Insufficient documentation

## 2010-07-24 DIAGNOSIS — IMO0001 Reserved for inherently not codable concepts without codable children: Secondary | ICD-10-CM | POA: Insufficient documentation

## 2010-07-24 DIAGNOSIS — M6281 Muscle weakness (generalized): Secondary | ICD-10-CM | POA: Insufficient documentation

## 2010-07-31 ENCOUNTER — Encounter: Payer: BC Managed Care – PPO | Admitting: Physical Therapy

## 2010-08-02 ENCOUNTER — Encounter: Payer: BC Managed Care – PPO | Admitting: Physical Therapy

## 2010-08-14 ENCOUNTER — Encounter: Payer: Self-pay | Admitting: Family Medicine

## 2010-08-15 ENCOUNTER — Ambulatory Visit: Payer: Federal, State, Local not specified - PPO | Attending: Sports Medicine | Admitting: Physical Therapy

## 2010-08-15 ENCOUNTER — Encounter: Payer: Self-pay | Admitting: Family Medicine

## 2010-08-15 ENCOUNTER — Ambulatory Visit (INDEPENDENT_AMBULATORY_CARE_PROVIDER_SITE_OTHER): Payer: Federal, State, Local not specified - PPO | Admitting: Family Medicine

## 2010-08-15 ENCOUNTER — Telehealth: Payer: Self-pay | Admitting: Family Medicine

## 2010-08-15 DIAGNOSIS — I781 Nevus, non-neoplastic: Secondary | ICD-10-CM

## 2010-08-15 DIAGNOSIS — G8929 Other chronic pain: Secondary | ICD-10-CM

## 2010-08-15 DIAGNOSIS — K146 Glossodynia: Secondary | ICD-10-CM

## 2010-08-15 DIAGNOSIS — M797 Fibromyalgia: Secondary | ICD-10-CM

## 2010-08-15 DIAGNOSIS — M25519 Pain in unspecified shoulder: Secondary | ICD-10-CM | POA: Insufficient documentation

## 2010-08-15 DIAGNOSIS — L723 Sebaceous cyst: Secondary | ICD-10-CM

## 2010-08-15 DIAGNOSIS — IMO0001 Reserved for inherently not codable concepts without codable children: Secondary | ICD-10-CM | POA: Insufficient documentation

## 2010-08-15 DIAGNOSIS — M6281 Muscle weakness (generalized): Secondary | ICD-10-CM | POA: Insufficient documentation

## 2010-08-15 MED ORDER — MAGIC MOUTHWASH: 5.0000 mL | Freq: Four times a day (QID) | ORAL | Status: DC

## 2010-08-15 MED ORDER — TELMISARTAN 40 MG PO TABS: 40.0000 mg | ORAL_TABLET | Freq: Every day | ORAL | Status: DC

## 2010-08-15 NOTE — Telephone Encounter (Signed)
Please call [facility] and let them know they can use whatever formula they have on hand. The Magic mouthwash as listed in the computer system is the only one that I could choose in the electronic system but they have arrested. Whatever they typically use is fine.

## 2010-08-15 NOTE — Progress Notes (Signed)
Subjective:    Patient ID: Sally [name], female    DOB: [birth year], 65 y.o.   MRN: [id number]  HPI No prior hx of skin cancer. Both parent had hx of skin cancer but she is not sure what kind.  She has some spot on her skin she would like to know.  Has one lesion on her left shoulder and on her back . Lesion on her back ithches all the itme.  Also lesion on right lower leg for 10 days.    Chest the complains of feeling like her tongue is burning. She was having symptoms of constipation which was the third shot and bought a low bowel stimulant. After taking it for couple days she exited the tongue burning improved but did not resolve. She has not noticed any rash but says she feels it looks irritated. She has not tried any other treatments.   Review of Systems  Constitutional: Negative for fever, diaphoresis and unexpected weight change.  HENT: Negative for hearing loss, rhinorrhea and tinnitus.   Eyes: Negative for visual disturbance.  Respiratory: Negative for cough and wheezing.   Cardiovascular: Negative for chest pain and palpitations.  Gastrointestinal: Negative for nausea, vomiting, diarrhea and blood in stool.  Genitourinary: Negative for vaginal bleeding, vaginal discharge and difficulty urinating.  Musculoskeletal: Positive for myalgias. Negative for arthralgias.  Skin: Positive for rash.  Neurological: Negative for headaches.  Hematological: Negative for adenopathy. Does not bruise/bleed easily.  Psychiatric/Behavioral: Negative for sleep disturbance and dysphoric mood. The patient is not nervous/anxious.    BP 152/95  Pulse 75    Allergies  Allergen Reactions  . Codeine Nausea Only    Past Medical History  Diagnosis Date  . Fibromyalgia   . Chronic pain syndrome   . Arthritis   . Hx of colonic polyps   . Hypertension   . Obesity   . Chronic UTI   . Allergy   . Hypothyroidism     Past Surgical History  Procedure Date  . Cesarean section     x 2   .  Cholecystectomy 1982  . Tonsillectomy 1959  . Foot surgery 1992  . Knee surgery 1992  . Appendectomy 1965  . Abdominal hysterectomy 1990    History   Social History  . Marital Status: Married    Spouse Name: Art    Number of Children: N/A  . Years of Education: N/A   Occupational History  . Sub Teacher     [employer]   Social History Main Topics  . Smoking status: Never Smoker   . Smokeless tobacco: Not on file  . Alcohol Use: No  . Drug Use: No  . Sexually Active:      married, doesn't regularly exercise, 6 children, 21 grandchildren.   Other Topics Concern  . Not on file   Social History Narrative  . No narrative on file    Family History  Problem Relation Age of Onset  . Hypertension      Family history  . Thyroid disease      family history  . Cancer Mother 11    pancratic  . Cancer Father 22    hematologic malignancy   . Arthritis Sister   . Arthritis Sister     Current outpatient prescriptions:oxyCODONE (OXYCONTIN) 40 MG 12 hr tablet, Take 40 mg by mouth every 12 (twelve) hours.  , Disp: , Rfl: ;  thyroid (ARMOUR) 90 MG tablet, Take 90 mg by mouth daily.  , Disp: ,  Rfl: ;  Alum & Mag Hydroxide-Simeth (MAGIC MOUTHWASH) SOLN, Take 5 mLs by mouth 4 (four) times daily., Disp: 200 mL, Rfl: 0 telmisartan (MICARDIS) 40 MG tablet, Take 1 tablet (40 mg total) by mouth daily. Take  (1/2) pill of 40 mg daily., Disp: 30 tablet, Rfl: 1;  DISCONTD: ciprofloxacin (CIPRO) 500 MG/5ML (10%) suspension, Take 500 mg by mouth 2 (two) times daily.  , Disp: , Rfl: ;  DISCONTD: phenazopyridine (PYRIDIUM) 100 MG tablet, Take 100 mg by mouth 3 (three) times daily as needed.  , Disp: , Rfl:  DISCONTD: telmisartan (MICARDIS) 40 MG tablet, Take by mouth daily. Take  (1/2) pill of 40 mg daily. , Disp: , Rfl:      Objective:   Physical Exam  Constitutional: She is oriented to person, place, and time. She appears well-developed and well-nourished.  HENT:  Head: Normocephalic and  atraumatic.  Nose: Nose normal.  Cardiovascular: Normal rate, regular rhythm and normal heart sounds.   Pulmonary/Chest: Effort normal and breath sounds normal.  Neurological: She is alert and oriented to person, place, and time.  Psychiatric: She has a normal mood and affect.      erythematous   macule with scaling on right lower shin 2.0 x 1.3. Looks like a small burn.  Cherry angioma on her mid back.   Sebaceious cyst on her left upper shoulder.     Assessment & Plan:   Tongue burning - her exam is normal. I do not see any evidence of rash. We will try Magic mouthwash and see if this helps her symptoms. Consider this also could be a type of neuropathy if it persists.  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 Next  Sedation cysts-I gave her reassurance. It does not appear to be inflamed or infected.  Lesion on her right lower leg appears to be consistent with a burn. I asked her to follow for the next couple weeks if it does not continue to improve and we can consider a biopsy of the area.

## 2010-08-15 NOTE — Telephone Encounter (Signed)
Pharmacy [facility]) called, and they need clarification on the Magic Mouthwash order that was sent earlier today.  It is regarding the formula.  Please advise them at [phone]. Plan:  Routed to Dr. Marlyne Beards, LPN [facility]

## 2010-08-16 NOTE — Telephone Encounter (Signed)
Called the pharmacy at [facility] and left mess on doctor line that the pharmacist may use whatever magic mouthwash formula they have on hand currently. Jarvis Newcomer, LPN [facility]

## 2010-08-26 ENCOUNTER — Encounter: Payer: Federal, State, Local not specified - PPO | Admitting: Physical Therapy

## 2011-03-05 ENCOUNTER — Other Ambulatory Visit: Payer: Self-pay | Admitting: Family Medicine

## 2011-03-07 ENCOUNTER — Emergency Department
Admission: EM | Admit: 2011-03-07 | Discharge: 2011-03-07 | Disposition: A | Discharge status: Home / Self Care | Payer: Federal, State, Local not specified - PPO | Source: Home / Self Care

## 2011-03-07 DIAGNOSIS — R300 Dysuria: Secondary | ICD-10-CM

## 2011-03-07 DIAGNOSIS — N390 Urinary tract infection, site not specified: Secondary | ICD-10-CM

## 2011-03-07 LAB — POCT URINALYSIS DIPSTICK
Nitrite, UA: POSITIVE
Protein, UA: NEGATIVE
Urobilinogen, UA: 1 (ref 0–1)
pH, UA: 5.5 (ref 5–8)

## 2011-03-07 MED ORDER — PHENAZOPYRIDINE HCL 200 MG PO TABS: 200.0000 mg | ORAL_TABLET | Freq: Three times a day (TID) | ORAL | Status: AC

## 2011-03-07 MED ORDER — NITROFURANTOIN MONOHYD MACRO 100 MG PO CAPS: 100.0000 mg | ORAL_CAPSULE | Freq: Two times a day (BID) | ORAL | Status: AC

## 2011-03-07 MED ORDER — CIPROFLOXACIN HCL 500 MG PO TABS: 500.0000 mg | ORAL_TABLET | Freq: Two times a day (BID) | ORAL | Status: DC

## 2011-03-07 NOTE — ED Provider Notes (Addendum)
History     CSN: [id number]  Arrival date & time 03/07/11  1837   None     No chief complaint on file.   (Consider location/radiation/quality/duration/timing/severity/associated sxs/prior treatment) HPI Sally [name] is a 66 y.o. female who presents today with UTI symptoms for 1 days.  + dysuria + frequency + urgency No hematuria No vaginal discharge No fever/chills + lower abdominal pain + mild nausea No back pain No fatigue    Past Medical History  Diagnosis Date  . Fibromyalgia   . Chronic pain syndrome   . Arthritis   . Hx of colonic polyps   . Hypertension   . Obesity   . Chronic UTI   . Allergy   . Hypothyroidism     Past Surgical History  Procedure Date  . Cesarean section     x 2   . Cholecystectomy 1982  . Tonsillectomy 1959  . Foot surgery 1992  . Knee surgery 1992  . Appendectomy 1965  . Abdominal hysterectomy 1990    Family History  Problem Relation Age of Onset  . Hypertension      Family history  . Thyroid disease      family history  . Cancer Mother 36    pancratic  . Cancer Father 2    hematologic malignancy   . Arthritis Sister   . Arthritis Sister     History  Substance Use Topics  . Smoking status: Never Smoker   . Smokeless tobacco: Not on file  . Alcohol Use: No    OB History    Grav Para Term Preterm Abortions TAB SAB Ect Mult Living                  Review of Systems  Allergies  Codeine  Home Medications   Current Outpatient Rx  Name Route Sig Dispense Refill  . MAGIC MOUTHWASH Oral Take 5 mLs by mouth 4 (four) times daily. 200 mL 0    Swish for as long as possible and then spit.  Marland Kitchen MICARDIS 40 MG PO TABS  TAKE 1 TABLET DAILY AS DIRECTED 30 each 0  . OXYCODONE HCL ER 40 MG PO TB12 Oral Take 40 mg by mouth every 12 (twelve) hours.      . THYROID 90 MG PO TABS Oral Take 90 mg by mouth daily.        There were no vitals taken for this visit.  Physical Exam  Nursing note and vitals reviewed. Constitutional:  She is oriented to person, place, and time. She appears well-developed and well-nourished.  HENT:  Head: Normocephalic and atraumatic.  Eyes: No scleral icterus.  Neck: Neck supple.  Cardiovascular: Regular rhythm and normal heart sounds.   Pulmonary/Chest: Effort normal and breath sounds normal. No respiratory distress.  Abdominal: Soft. Normal appearance and bowel sounds are normal. She exhibits no mass. There is no rebound, no guarding and no CVA tenderness.  Neurological: She is alert and oriented to person, place, and time.  Skin: Skin is warm and dry.  Psychiatric: She has a normal mood and affect. Her speech is normal.    ED Course  Procedures (including critical care time)  Labs Reviewed - No data to display No results found.   No diagnosis found.    MDM  1) Take the prescribed antibiotic as directed.  Given the options patient has decided that she would like to take Macrobid rather than Bactrim or Cipro. Denies. Azo was also given per the patient's request. 2)  A urinalysis was done in clinic.  A urine culture is pending. 3) Follow up with your PCP or urologist if not improving or if worsening symptoms.     Lily Kocher, MD 03/07/11 1853  Lily Kocher, MD 03/07/11 1901

## 2011-03-07 NOTE — ED Notes (Signed)
States painful urination started this morning

## 2011-03-09 ENCOUNTER — Telehealth: Payer: Self-pay | Admitting: Family Medicine

## 2011-03-10 LAB — URINE CULTURE: Colony Count: 60000

## 2011-03-11 ENCOUNTER — Telehealth: Payer: Self-pay | Admitting: *Deleted

## 2011-04-22 ENCOUNTER — Ambulatory Visit: Payer: Federal, State, Local not specified - PPO | Attending: Family Medicine | Admitting: Physical Therapy

## 2011-04-22 DIAGNOSIS — IMO0001 Reserved for inherently not codable concepts without codable children: Secondary | ICD-10-CM | POA: Insufficient documentation

## 2011-04-22 DIAGNOSIS — M6281 Muscle weakness (generalized): Secondary | ICD-10-CM | POA: Insufficient documentation

## 2011-04-22 DIAGNOSIS — M25519 Pain in unspecified shoulder: Secondary | ICD-10-CM | POA: Insufficient documentation

## 2011-05-17 ENCOUNTER — Emergency Department
Admit: 2011-05-17 | Discharge: 2011-05-17 | Disposition: A | Discharge status: Home / Self Care | Payer: Federal, State, Local not specified - PPO

## 2011-05-17 ENCOUNTER — Emergency Department (INDEPENDENT_AMBULATORY_CARE_PROVIDER_SITE_OTHER)
Admission: EM | Admit: 2011-05-17 | Discharge: 2011-05-17 | Disposition: A | Discharge status: Home / Self Care | Payer: Federal, State, Local not specified - PPO | Source: Home / Self Care | Attending: Family Medicine | Admitting: Family Medicine

## 2011-05-17 DIAGNOSIS — M79622 Pain in left upper arm: Secondary | ICD-10-CM

## 2011-05-17 DIAGNOSIS — M79609 Pain in unspecified limb: Secondary | ICD-10-CM

## 2011-05-17 LAB — POCT CBC W AUTO DIFF (K'VILLE URGENT CARE)

## 2011-05-17 LAB — SEDIMENTATION RATE: Sed Rate: 12 mm/hr (ref 0–22)

## 2011-05-17 NOTE — Discharge Instructions (Signed)
Apply heating pad several times daily.  Begin range of motion exercises.  Followup with orthopedist.

## 2011-05-17 NOTE — ED Notes (Signed)
Patient complains of upper left arm pain- throbbing and can be severe at times x 1 month. Swelling in upper left arm x 1 week. She states it is hot to touch. She has tried ice and heat. Heat has helped some with the pain.

## 2011-05-17 NOTE — ED Provider Notes (Signed)
History     CSN: [id number]  Arrival date & time 05/17/11  1624   First MD Initiated Contact with Patient 05/17/11 1658      Chief Complaint  Patient presents with  . Arm Pain    upper left arm pain     HPI Comments: Patient complains of pain, swelling, and warmth in her left upper arm for one week.  She has chronic left shoulder pain, and has been told by her orthopedist that she needs a left shoulder replacement.  She recalls no injury to her left upper arm, and has had no recent change in physical activities.  The history is provided by the patient.    Past Medical History  Diagnosis Date  . Fibromyalgia   . Chronic pain syndrome   . Arthritis   . Hx of colonic polyps   . Hypertension   . Obesity   . Chronic UTI   . Allergy   . Hypothyroidism     Past Surgical History  Procedure Date  . Cesarean section     x 2   . Cholecystectomy 1982  . Tonsillectomy 1959  . Foot surgery 1992  . Knee surgery 1992  . Appendectomy 1965  . Abdominal hysterectomy 1990    Family History  Problem Relation Age of Onset  . Hypertension      Family history  . Thyroid disease      family history  . Cancer Mother 86    pancratic  . Cancer Father 39    hematologic malignancy   . Arthritis Sister   . Arthritis Sister     History  Substance Use Topics  . Smoking status: Never Smoker   . Smokeless tobacco: Not on file  . Alcohol Use: No    OB History    Grav Para Term Preterm Abortions TAB SAB Ect Mult Living                  Review of Systems  Constitutional: Negative.   Musculoskeletal: Positive for joint swelling.  All other systems reviewed and are negative.    Allergies  Codeine  Home Medications   Current Outpatient Rx  Name Route Sig Dispense Refill  . VITAMIN B 12 PO Oral Take by mouth.    . TENUATE PO Oral Take by mouth.    Marland Kitchen MICARDIS 40 MG PO TABS  TAKE 1 TABLET DAILY AS DIRECTED 30 each 0  . OXYCODONE HCL ER 40 MG PO TB12 Oral Take 40 mg by  mouth every 12 (twelve) hours.      . THYROID 90 MG PO TABS Oral Take 90 mg by mouth daily.      Marland Kitchen VITAMIN C (CALCIUM ASCORBATE) PO Oral Take by mouth.    Marland Kitchen VITAMIN D (CHOLECALCIFEROL) PO Oral Take by mouth.    Marland Kitchen MAGIC MOUTHWASH Oral Take 5 mLs by mouth 4 (four) times daily. 200 mL 0    Swish for as long as possible and then spit.    BP 155/92  Pulse 82  Temp(Src) 98.3 F (36.8 C) (Oral)  Resp 16  Ht 5\' 7"  (1.702 m)  Wt 230 lb (104.327 kg)  BMI 36.02 kg/m2  SpO2 98%  Physical Exam  Nursing note and vitals reviewed. Constitutional: She appears well-developed and well-nourished. No distress.       Patient is obese (BMI 36.1)  Musculoskeletal:       Left upper arm: She exhibits tenderness, bony tenderness and swelling. She exhibits no  edema, no deformity and no laceration.       Arms:      There is tenderness over the left mid-humerus, and tenderness over biceps, especially with resisted flexion of the biceps muscle.  Also has tenderness at insertion of biceps tendons proximally.  Elbow has full range of motion.  Distal Neurovascular function is intact.  Upper arm reveals no erythema, warmth or swelling.    ED Course  Procedures none   Labs Reviewed  POCT CBC W AUTO DIFF ([facility])  WBC 9.0; LY 41.1; MO 10.0; GR 48.9; Hgb 12.4; Platelets 348   SEDIMENTATION RATE pending   Narrative:    Performed at:  [facility]                [address]                [place], [place] [address]   Dg Humerus Left  05/17/2011  *RADIOLOGY REPORT*  Clinical Data: Left upper arm pain.  LEFT HUMERUS - 2+ VIEW  Comparison: None.  Findings: Moderate degenerative changes are evident that the shoulder and elbow joints.  No acute bone or soft tissue abnormality is present in the left upper arm.  The shoulder and elbow joints are located.  IMPRESSION:  1.  Degenerative changes of the shoulder elbow. 2.  No acute abnormality.  Original Report Authenticated By: Jamesetta Orleans.  MATTERN, M.D.     1. Left upper arm pain       MDM  Sed rate pending.  No clear etiology for patient's pain.  Recommend applying heating pad several times daily.   Followup with orthopedist.        Lattie Haw, MD 05/17/11 2229

## 2011-05-19 ENCOUNTER — Telehealth: Payer: Self-pay | Admitting: *Deleted

## 2011-07-21 ENCOUNTER — Emergency Department (INDEPENDENT_AMBULATORY_CARE_PROVIDER_SITE_OTHER)
Admission: EM | Admit: 2011-07-21 | Discharge: 2011-07-21 | Disposition: A | Discharge status: Home / Self Care | Payer: Federal, State, Local not specified - PPO | Source: Home / Self Care | Attending: Family Medicine | Admitting: Family Medicine

## 2011-07-21 ENCOUNTER — Encounter: Payer: Self-pay | Admitting: Emergency Medicine

## 2011-07-21 DIAGNOSIS — J069 Acute upper respiratory infection, unspecified: Secondary | ICD-10-CM

## 2011-07-21 DIAGNOSIS — R300 Dysuria: Secondary | ICD-10-CM

## 2011-07-21 LAB — POCT URINALYSIS DIP (MANUAL ENTRY)
Glucose, UA: NEGATIVE
Leukocytes, UA: NEGATIVE
Nitrite, UA: NEGATIVE
Urobilinogen, UA: 1 (ref 0–1)

## 2011-07-21 MED ORDER — BENZONATATE 200 MG PO CAPS: 200.0000 mg | ORAL_CAPSULE | Freq: Every day | ORAL | Status: AC

## 2011-07-21 MED ORDER — AMOXICILLIN 875 MG PO TABS: 875.0000 mg | ORAL_TABLET | Freq: Two times a day (BID) | ORAL | Status: AC

## 2011-07-21 NOTE — ED Provider Notes (Signed)
History     CSN: [id number]  Arrival date & time 07/21/11  1639   First MD Initiated Contact with Patient 07/21/11 1737      Chief Complaint  Patient presents with  . Cough  . Fever      HPI Comments: Patient complains of two day history of gradually progressive URI symptoms beginning with a mild sore throat, nasal congestion, and a cough.  Complains of fatigue and initial myalgias.  Cough is now worse at night and generally non-productive during the day.  There has been no pleuritic pain or shortness of breath but she occasionally wheezes.  Prior to onset of symptoms, she had a mild left earache for one week. She also complains of mild, vague dysuria that persists.  Three weeks ago she had a negative urinalysis and urine culture, and her symptoms are essentially unchanged.  No abdominal or pelvic pain.  The history is provided by the patient.    Past Medical History  Diagnosis Date  . Fibromyalgia   . Chronic pain syndrome   . Arthritis   . Hx of colonic polyps   . Hypertension   . Obesity   . Chronic UTI   . Allergy   . Hypothyroidism     Past Surgical History  Procedure Date  . Cesarean section     x 2   . Cholecystectomy 1982  . Tonsillectomy 1959  . Foot surgery 1992  . Knee surgery 1992  . Appendectomy 1965  . Abdominal hysterectomy 1990    Family History  Problem Relation Age of Onset  . Hypertension      Family history  . Thyroid disease      family history  . Cancer Mother 63    pancratic  . Cancer Father 94    hematologic malignancy   . Arthritis Sister   . Arthritis Sister     History  Substance Use Topics  . Smoking status: Never Smoker   . Smokeless tobacco: Not on file  . Alcohol Use: No    OB History    Grav Para Term Preterm Abortions TAB SAB Ect Mult Living                  Review of Systems + sore throat + cough No pleuritic pain + wheezing + nasal congestion ? post-nasal drainage No sinus pain/pressure No itchy/red  eyes + left earache No hemoptysis No SOB No fever, + chills + nausea No vomiting No abdominal pain No diarrhea ? Mild dysuria No skin rashes + fatigue + myalgias No headache   Allergies  Codeine  Home Medications   Current Outpatient Rx  Name Route Sig Dispense Refill  . MAGIC MOUTHWASH Oral Take 5 mLs by mouth 4 (four) times daily. 200 mL 0    Swish for as long as possible and then spit.  Marland Kitchen AMOXICILLIN 875 MG PO TABS Oral Take 1 tablet (875 mg total) by mouth 2 (two) times daily. (Rx void after 07/29/11) 14 tablet 0  . BENZONATATE 200 MG PO CAPS Oral Take 1 capsule (200 mg total) by mouth at bedtime. Take as needed for cough 12 capsule 0  . VITAMIN B 12 PO Oral Take by mouth.    . TENUATE PO Oral Take by mouth.    Marland Kitchen MICARDIS 40 MG PO TABS  TAKE 1 TABLET DAILY AS DIRECTED 30 each 0  . OXYCODONE HCL ER 40 MG PO TB12 Oral Take 40 mg by mouth every 12 (twelve)  hours.      . THYROID 90 MG PO TABS Oral Take 90 mg by mouth daily.      Marland Kitchen VITAMIN C (CALCIUM ASCORBATE) PO Oral Take by mouth.    Marland Kitchen VITAMIN D (CHOLECALCIFEROL) PO Oral Take by mouth.      BP 150/90  Pulse 80  Temp(Src) 99.8 F (37.7 C) (Oral)  Resp 16  Ht 5\' 7"  (1.702 m)  Wt 235 lb (106.595 kg)  BMI 36.81 kg/m2  SpO2 95%  Physical Exam Nursing notes and Vital Signs reviewed. Appearance:  Patient appears stated age, and in no acute distress.  Patient is obese (BMI 36.9) Eyes:  Pupils are equal, round, and reactive to light and accomodation.  Extraocular movement is intact.  Conjunctivae are not inflamed  Ears:  Canals normal.  Tympanic membranes normal.  Nose:  Mildly congested turbinates.  No sinus tenderness.   Pharynx:  Normal Neck:  Supple.  Slightly tender shotty posterior nodes are palpated bilaterally  Lungs:  Clear to auscultation.  Breath sounds are equal.  Heart:  Regular rate and rhythm without murmurs, rubs, or gallops.  Abdomen:  Nontender without masses or hepatosplenomegaly.  Bowel sounds are  present.  No CVA or flank tenderness.  Extremities:  No edema.  No calf tenderness Skin:  No rash present.   ED Course  Procedures  none   Labs Reviewed  POCT URINALYSIS DIP (MANUAL ENTRY) trace blood, otherwise negative      1. Acute upper respiratory infections of unspecified site   2. Dysuria without evidence UTI      MDM   There is no evidence of bacterial infection today.   Treat symptomatically for now: Prescription written for Benzonatate [facility]) to take at bedtime for night-time cough.  Take plain Mucinex (guaifenesin) twice daily for cough and congestion.  Increase fluid intake, rest. May use Afrin nasal spray (or generic oxymetazoline) twice daily for about 5 days.  Also recommend using saline nasal spray several times daily and saline nasal irrigation (AYR is a common brand) Stop all antihistamines for now, and other non-prescription cough/cold preparations. Begin Amoxicillin if not improving about 5 days or if persistent fever develops (Given a prescription to hold, with an expiration date)  Follow-up with family doctor if not improving 7 to 10 days.        Lattie Haw, MD 07/22/11 1114

## 2011-07-21 NOTE — ED Notes (Signed)
Cough, fever and weakness x 3 days. No Flu vaccination this season. Refuses Flu test. Has taken OTCs today.

## 2011-07-21 NOTE — Discharge Instructions (Signed)
Take plain Mucinex (guaifenesin) twice daily for cough and congestion.  Increase fluid intake, rest. May use Afrin nasal spray (or generic oxymetazoline) twice daily for about 5 days.  Also recommend using saline nasal spray several times daily and saline nasal irrigation (AYR is a common brand) Stop all antihistamines for now, and other non-prescription cough/cold preparations. Begin Amoxicillin if not improving about 5 days or if persistent fever develops. Follow-up with family doctor if not improving 7 to 10 days.

## 2011-07-22 ENCOUNTER — Telehealth: Payer: Self-pay | Admitting: *Deleted

## 2011-07-22 NOTE — ED Notes (Signed)
Pt called states that benzonatate is not helping. Per standing order changed to Tussinex Pennkinetic 5ml po qhs prn # 60 ml/ 0RF to [facility].  pharmacy. Pt is allergic to codeine but states she has taken hydrocodone in the past.

## 2011-08-04 DIAGNOSIS — R05 Cough: Secondary | ICD-10-CM | POA: Diagnosis not present

## 2011-08-25 DIAGNOSIS — M2550 Pain in unspecified joint: Secondary | ICD-10-CM | POA: Diagnosis not present

## 2011-08-28 DIAGNOSIS — M47817 Spondylosis without myelopathy or radiculopathy, lumbosacral region: Secondary | ICD-10-CM | POA: Diagnosis not present

## 2011-08-28 DIAGNOSIS — M1990 Unspecified osteoarthritis, unspecified site: Secondary | ICD-10-CM | POA: Diagnosis not present

## 2011-08-28 DIAGNOSIS — M25519 Pain in unspecified shoulder: Secondary | ICD-10-CM | POA: Diagnosis not present

## 2011-08-28 DIAGNOSIS — G894 Chronic pain syndrome: Secondary | ICD-10-CM | POA: Diagnosis not present

## 2011-09-11 DIAGNOSIS — M25519 Pain in unspecified shoulder: Secondary | ICD-10-CM | POA: Diagnosis not present

## 2011-09-29 DIAGNOSIS — I10 Essential (primary) hypertension: Secondary | ICD-10-CM | POA: Diagnosis not present

## 2011-09-29 DIAGNOSIS — R079 Chest pain, unspecified: Secondary | ICD-10-CM | POA: Diagnosis not present

## 2011-10-03 DIAGNOSIS — E559 Vitamin D deficiency, unspecified: Secondary | ICD-10-CM | POA: Diagnosis not present

## 2011-10-03 DIAGNOSIS — R300 Dysuria: Secondary | ICD-10-CM | POA: Diagnosis not present

## 2011-10-03 DIAGNOSIS — R5383 Other fatigue: Secondary | ICD-10-CM | POA: Diagnosis not present

## 2011-10-03 DIAGNOSIS — I10 Essential (primary) hypertension: Secondary | ICD-10-CM | POA: Diagnosis not present

## 2011-10-03 DIAGNOSIS — R5381 Other malaise: Secondary | ICD-10-CM | POA: Diagnosis not present

## 2011-10-13 DIAGNOSIS — M7150 Other bursitis, not elsewhere classified, unspecified site: Secondary | ICD-10-CM | POA: Diagnosis not present

## 2011-10-13 DIAGNOSIS — M2040 Other hammer toe(s) (acquired), unspecified foot: Secondary | ICD-10-CM | POA: Diagnosis not present

## 2011-10-15 DIAGNOSIS — M79609 Pain in unspecified limb: Secondary | ICD-10-CM | POA: Diagnosis not present

## 2011-10-15 DIAGNOSIS — R404 Transient alteration of awareness: Secondary | ICD-10-CM | POA: Diagnosis not present

## 2011-10-15 DIAGNOSIS — I10 Essential (primary) hypertension: Secondary | ICD-10-CM | POA: Diagnosis not present

## 2011-10-20 ENCOUNTER — Other Ambulatory Visit: Payer: Self-pay | Admitting: Family Medicine

## 2011-10-20 ENCOUNTER — Ambulatory Visit: Payer: Medicare Other | Attending: Sports Medicine | Admitting: Physical Therapy

## 2011-10-20 DIAGNOSIS — M6281 Muscle weakness (generalized): Secondary | ICD-10-CM | POA: Insufficient documentation

## 2011-10-20 DIAGNOSIS — IMO0001 Reserved for inherently not codable concepts without codable children: Secondary | ICD-10-CM | POA: Insufficient documentation

## 2011-10-20 DIAGNOSIS — M25619 Stiffness of unspecified shoulder, not elsewhere classified: Secondary | ICD-10-CM | POA: Diagnosis not present

## 2011-10-20 DIAGNOSIS — M25519 Pain in unspecified shoulder: Secondary | ICD-10-CM | POA: Insufficient documentation

## 2011-10-23 ENCOUNTER — Ambulatory Visit: Payer: Medicare Other | Admitting: Physical Therapy

## 2011-10-23 DIAGNOSIS — M47817 Spondylosis without myelopathy or radiculopathy, lumbosacral region: Secondary | ICD-10-CM | POA: Diagnosis not present

## 2011-10-23 DIAGNOSIS — M25519 Pain in unspecified shoulder: Secondary | ICD-10-CM | POA: Diagnosis not present

## 2011-10-23 DIAGNOSIS — G894 Chronic pain syndrome: Secondary | ICD-10-CM | POA: Diagnosis not present

## 2011-10-23 DIAGNOSIS — M1990 Unspecified osteoarthritis, unspecified site: Secondary | ICD-10-CM | POA: Diagnosis not present

## 2011-10-28 ENCOUNTER — Encounter: Payer: Federal, State, Local not specified - PPO | Admitting: Physical Therapy

## 2011-10-29 ENCOUNTER — Encounter: Payer: Federal, State, Local not specified - PPO | Admitting: Physical Therapy

## 2011-10-30 ENCOUNTER — Encounter: Payer: Federal, State, Local not specified - PPO | Admitting: Physical Therapy

## 2011-10-30 DIAGNOSIS — R5383 Other fatigue: Secondary | ICD-10-CM | POA: Diagnosis not present

## 2011-10-30 DIAGNOSIS — R05 Cough: Secondary | ICD-10-CM | POA: Diagnosis not present

## 2011-10-30 DIAGNOSIS — R5381 Other malaise: Secondary | ICD-10-CM | POA: Diagnosis not present

## 2011-10-30 DIAGNOSIS — I10 Essential (primary) hypertension: Secondary | ICD-10-CM | POA: Diagnosis not present

## 2011-10-30 DIAGNOSIS — R079 Chest pain, unspecified: Secondary | ICD-10-CM | POA: Diagnosis not present

## 2011-10-31 ENCOUNTER — Encounter: Payer: Federal, State, Local not specified - PPO | Admitting: Physical Therapy

## 2011-11-04 ENCOUNTER — Ambulatory Visit: Payer: Medicare Other | Attending: Sports Medicine | Admitting: Physical Therapy

## 2011-11-04 DIAGNOSIS — IMO0001 Reserved for inherently not codable concepts without codable children: Secondary | ICD-10-CM | POA: Insufficient documentation

## 2011-11-04 DIAGNOSIS — M6281 Muscle weakness (generalized): Secondary | ICD-10-CM | POA: Diagnosis not present

## 2011-11-04 DIAGNOSIS — M25619 Stiffness of unspecified shoulder, not elsewhere classified: Secondary | ICD-10-CM | POA: Insufficient documentation

## 2011-11-04 DIAGNOSIS — M25519 Pain in unspecified shoulder: Secondary | ICD-10-CM | POA: Diagnosis not present

## 2011-11-06 ENCOUNTER — Ambulatory Visit: Payer: Medicare Other | Admitting: Physical Therapy

## 2011-11-07 DIAGNOSIS — M2040 Other hammer toe(s) (acquired), unspecified foot: Secondary | ICD-10-CM | POA: Diagnosis not present

## 2011-11-07 DIAGNOSIS — M779 Enthesopathy, unspecified: Secondary | ICD-10-CM | POA: Diagnosis not present

## 2011-11-07 DIAGNOSIS — L4050 Arthropathic psoriasis, unspecified: Secondary | ICD-10-CM | POA: Diagnosis not present

## 2011-11-07 DIAGNOSIS — M19019 Primary osteoarthritis, unspecified shoulder: Secondary | ICD-10-CM | POA: Diagnosis not present

## 2011-11-07 DIAGNOSIS — M19079 Primary osteoarthritis, unspecified ankle and foot: Secondary | ICD-10-CM | POA: Diagnosis not present

## 2011-11-10 ENCOUNTER — Ambulatory Visit: Payer: Medicare Other | Admitting: Physical Therapy

## 2011-11-10 DIAGNOSIS — I10 Essential (primary) hypertension: Secondary | ICD-10-CM | POA: Diagnosis not present

## 2011-11-10 DIAGNOSIS — J019 Acute sinusitis, unspecified: Secondary | ICD-10-CM | POA: Diagnosis not present

## 2011-11-13 ENCOUNTER — Ambulatory Visit: Payer: Medicare Other | Admitting: Physical Therapy

## 2011-11-17 DIAGNOSIS — M216X9 Other acquired deformities of unspecified foot: Secondary | ICD-10-CM | POA: Diagnosis not present

## 2011-11-20 DIAGNOSIS — E669 Obesity, unspecified: Secondary | ICD-10-CM | POA: Diagnosis not present

## 2011-12-10 DIAGNOSIS — M25579 Pain in unspecified ankle and joints of unspecified foot: Secondary | ICD-10-CM | POA: Diagnosis not present

## 2011-12-15 DIAGNOSIS — M47817 Spondylosis without myelopathy or radiculopathy, lumbosacral region: Secondary | ICD-10-CM | POA: Diagnosis not present

## 2011-12-15 DIAGNOSIS — M1990 Unspecified osteoarthritis, unspecified site: Secondary | ICD-10-CM | POA: Diagnosis not present

## 2011-12-15 DIAGNOSIS — G894 Chronic pain syndrome: Secondary | ICD-10-CM | POA: Diagnosis not present

## 2011-12-16 DIAGNOSIS — E669 Obesity, unspecified: Secondary | ICD-10-CM | POA: Diagnosis not present

## 2011-12-25 DIAGNOSIS — E039 Hypothyroidism, unspecified: Secondary | ICD-10-CM | POA: Diagnosis not present

## 2012-01-14 DIAGNOSIS — Q438 Other specified congenital malformations of intestine: Secondary | ICD-10-CM | POA: Diagnosis not present

## 2012-01-14 DIAGNOSIS — Z09 Encounter for follow-up examination after completed treatment for conditions other than malignant neoplasm: Secondary | ICD-10-CM | POA: Diagnosis not present

## 2012-01-14 DIAGNOSIS — Z86010 Personal history of colonic polyps: Secondary | ICD-10-CM | POA: Diagnosis not present

## 2012-01-16 DIAGNOSIS — R300 Dysuria: Secondary | ICD-10-CM | POA: Diagnosis not present

## 2012-01-27 DIAGNOSIS — N390 Urinary tract infection, site not specified: Secondary | ICD-10-CM | POA: Diagnosis not present

## 2012-02-04 DIAGNOSIS — M25519 Pain in unspecified shoulder: Secondary | ICD-10-CM | POA: Diagnosis not present

## 2012-02-06 DIAGNOSIS — M1990 Unspecified osteoarthritis, unspecified site: Secondary | ICD-10-CM | POA: Diagnosis not present

## 2012-02-06 DIAGNOSIS — R5383 Other fatigue: Secondary | ICD-10-CM | POA: Diagnosis not present

## 2012-02-06 DIAGNOSIS — IMO0001 Reserved for inherently not codable concepts without codable children: Secondary | ICD-10-CM | POA: Diagnosis not present

## 2012-02-06 DIAGNOSIS — M2550 Pain in unspecified joint: Secondary | ICD-10-CM | POA: Diagnosis not present

## 2012-02-06 DIAGNOSIS — R5381 Other malaise: Secondary | ICD-10-CM | POA: Diagnosis not present

## 2012-02-09 DIAGNOSIS — M1990 Unspecified osteoarthritis, unspecified site: Secondary | ICD-10-CM | POA: Diagnosis not present

## 2012-02-09 DIAGNOSIS — G894 Chronic pain syndrome: Secondary | ICD-10-CM | POA: Diagnosis not present

## 2012-02-09 DIAGNOSIS — M25519 Pain in unspecified shoulder: Secondary | ICD-10-CM | POA: Diagnosis not present

## 2012-02-09 DIAGNOSIS — M47817 Spondylosis without myelopathy or radiculopathy, lumbosacral region: Secondary | ICD-10-CM | POA: Diagnosis not present

## 2012-02-16 DIAGNOSIS — Z20828 Contact with and (suspected) exposure to other viral communicable diseases: Secondary | ICD-10-CM | POA: Diagnosis not present

## 2012-02-16 DIAGNOSIS — IMO0001 Reserved for inherently not codable concepts without codable children: Secondary | ICD-10-CM | POA: Diagnosis not present

## 2012-02-16 DIAGNOSIS — D729 Disorder of white blood cells, unspecified: Secondary | ICD-10-CM | POA: Diagnosis not present

## 2012-03-08 DIAGNOSIS — L680 Hirsutism: Secondary | ICD-10-CM | POA: Diagnosis not present

## 2012-03-08 DIAGNOSIS — J029 Acute pharyngitis, unspecified: Secondary | ICD-10-CM | POA: Diagnosis not present

## 2012-03-08 DIAGNOSIS — J329 Chronic sinusitis, unspecified: Secondary | ICD-10-CM | POA: Diagnosis not present

## 2012-03-08 DIAGNOSIS — M2550 Pain in unspecified joint: Secondary | ICD-10-CM | POA: Diagnosis not present

## 2012-03-29 DIAGNOSIS — R5383 Other fatigue: Secondary | ICD-10-CM | POA: Diagnosis not present

## 2012-03-29 DIAGNOSIS — R5381 Other malaise: Secondary | ICD-10-CM | POA: Diagnosis not present

## 2012-04-13 DIAGNOSIS — E669 Obesity, unspecified: Secondary | ICD-10-CM | POA: Diagnosis not present

## 2012-04-13 DIAGNOSIS — Z6841 Body Mass Index (BMI) 40.0 and over, adult: Secondary | ICD-10-CM | POA: Diagnosis not present

## 2012-04-13 DIAGNOSIS — Z713 Dietary counseling and surveillance: Secondary | ICD-10-CM | POA: Diagnosis not present

## 2012-04-20 DIAGNOSIS — M2550 Pain in unspecified joint: Secondary | ICD-10-CM | POA: Diagnosis not present

## 2012-04-20 DIAGNOSIS — Z131 Encounter for screening for diabetes mellitus: Secondary | ICD-10-CM | POA: Diagnosis not present

## 2012-04-20 DIAGNOSIS — I10 Essential (primary) hypertension: Secondary | ICD-10-CM | POA: Diagnosis not present

## 2012-04-20 DIAGNOSIS — IMO0001 Reserved for inherently not codable concepts without codable children: Secondary | ICD-10-CM | POA: Diagnosis not present

## 2012-04-27 DIAGNOSIS — J029 Acute pharyngitis, unspecified: Secondary | ICD-10-CM | POA: Diagnosis not present

## 2012-04-27 DIAGNOSIS — I10 Essential (primary) hypertension: Secondary | ICD-10-CM | POA: Diagnosis not present

## 2012-04-27 DIAGNOSIS — J069 Acute upper respiratory infection, unspecified: Secondary | ICD-10-CM | POA: Diagnosis not present

## 2012-05-26 DIAGNOSIS — E039 Hypothyroidism, unspecified: Secondary | ICD-10-CM | POA: Diagnosis not present

## 2012-05-28 DIAGNOSIS — G894 Chronic pain syndrome: Secondary | ICD-10-CM | POA: Diagnosis not present

## 2012-05-28 DIAGNOSIS — M47817 Spondylosis without myelopathy or radiculopathy, lumbosacral region: Secondary | ICD-10-CM | POA: Diagnosis not present

## 2012-05-28 DIAGNOSIS — M25519 Pain in unspecified shoulder: Secondary | ICD-10-CM | POA: Diagnosis not present

## 2012-05-28 DIAGNOSIS — M1990 Unspecified osteoarthritis, unspecified site: Secondary | ICD-10-CM | POA: Diagnosis not present

## 2012-06-25 DIAGNOSIS — N390 Urinary tract infection, site not specified: Secondary | ICD-10-CM | POA: Diagnosis not present

## 2012-06-25 DIAGNOSIS — R5381 Other malaise: Secondary | ICD-10-CM | POA: Diagnosis not present

## 2012-06-25 DIAGNOSIS — E559 Vitamin D deficiency, unspecified: Secondary | ICD-10-CM | POA: Diagnosis not present

## 2012-06-25 DIAGNOSIS — E039 Hypothyroidism, unspecified: Secondary | ICD-10-CM | POA: Diagnosis not present

## 2012-06-25 DIAGNOSIS — R0602 Shortness of breath: Secondary | ICD-10-CM | POA: Diagnosis not present

## 2012-06-25 DIAGNOSIS — R5383 Other fatigue: Secondary | ICD-10-CM | POA: Diagnosis not present

## 2012-06-25 DIAGNOSIS — E782 Mixed hyperlipidemia: Secondary | ICD-10-CM | POA: Diagnosis not present

## 2012-07-06 DIAGNOSIS — N3010 Interstitial cystitis (chronic) without hematuria: Secondary | ICD-10-CM | POA: Diagnosis not present

## 2012-07-06 DIAGNOSIS — N390 Urinary tract infection, site not specified: Secondary | ICD-10-CM | POA: Diagnosis not present

## 2012-07-23 DIAGNOSIS — M47817 Spondylosis without myelopathy or radiculopathy, lumbosacral region: Secondary | ICD-10-CM | POA: Diagnosis not present

## 2012-07-23 DIAGNOSIS — M1990 Unspecified osteoarthritis, unspecified site: Secondary | ICD-10-CM | POA: Diagnosis not present

## 2012-07-23 DIAGNOSIS — M25519 Pain in unspecified shoulder: Secondary | ICD-10-CM | POA: Diagnosis not present

## 2012-07-23 DIAGNOSIS — G894 Chronic pain syndrome: Secondary | ICD-10-CM | POA: Diagnosis not present

## 2012-08-05 DIAGNOSIS — L821 Other seborrheic keratosis: Secondary | ICD-10-CM | POA: Diagnosis not present

## 2012-08-10 DIAGNOSIS — M19019 Primary osteoarthritis, unspecified shoulder: Secondary | ICD-10-CM | POA: Diagnosis not present

## 2012-08-16 DIAGNOSIS — M67919 Unspecified disorder of synovium and tendon, unspecified shoulder: Secondary | ICD-10-CM | POA: Diagnosis not present

## 2012-08-16 DIAGNOSIS — M47817 Spondylosis without myelopathy or radiculopathy, lumbosacral region: Secondary | ICD-10-CM | POA: Diagnosis not present

## 2012-08-16 DIAGNOSIS — Z79899 Other long term (current) drug therapy: Secondary | ICD-10-CM | POA: Diagnosis not present

## 2012-08-16 DIAGNOSIS — M1990 Unspecified osteoarthritis, unspecified site: Secondary | ICD-10-CM | POA: Diagnosis not present

## 2012-08-16 DIAGNOSIS — M719 Bursopathy, unspecified: Secondary | ICD-10-CM | POA: Diagnosis not present

## 2012-08-19 DIAGNOSIS — R635 Abnormal weight gain: Secondary | ICD-10-CM | POA: Diagnosis not present

## 2012-08-19 DIAGNOSIS — Z79899 Other long term (current) drug therapy: Secondary | ICD-10-CM | POA: Diagnosis not present

## 2012-08-20 DIAGNOSIS — M19019 Primary osteoarthritis, unspecified shoulder: Secondary | ICD-10-CM | POA: Diagnosis not present

## 2012-09-14 DIAGNOSIS — E669 Obesity, unspecified: Secondary | ICD-10-CM | POA: Diagnosis not present

## 2012-09-14 DIAGNOSIS — R599 Enlarged lymph nodes, unspecified: Secondary | ICD-10-CM | POA: Diagnosis not present

## 2012-09-17 DIAGNOSIS — Z79899 Other long term (current) drug therapy: Secondary | ICD-10-CM | POA: Diagnosis not present

## 2012-09-17 DIAGNOSIS — M25519 Pain in unspecified shoulder: Secondary | ICD-10-CM | POA: Diagnosis not present

## 2012-09-17 DIAGNOSIS — G894 Chronic pain syndrome: Secondary | ICD-10-CM | POA: Diagnosis not present

## 2012-09-17 DIAGNOSIS — M47817 Spondylosis without myelopathy or radiculopathy, lumbosacral region: Secondary | ICD-10-CM | POA: Diagnosis not present

## 2012-09-17 DIAGNOSIS — M1990 Unspecified osteoarthritis, unspecified site: Secondary | ICD-10-CM | POA: Diagnosis not present

## 2012-09-21 DIAGNOSIS — E559 Vitamin D deficiency, unspecified: Secondary | ICD-10-CM | POA: Diagnosis not present

## 2012-09-21 DIAGNOSIS — E039 Hypothyroidism, unspecified: Secondary | ICD-10-CM | POA: Diagnosis not present

## 2012-09-21 DIAGNOSIS — E782 Mixed hyperlipidemia: Secondary | ICD-10-CM | POA: Diagnosis not present

## 2012-09-21 DIAGNOSIS — Z79899 Other long term (current) drug therapy: Secondary | ICD-10-CM | POA: Diagnosis not present

## 2012-10-01 DIAGNOSIS — M19019 Primary osteoarthritis, unspecified shoulder: Secondary | ICD-10-CM | POA: Diagnosis not present

## 2012-10-13 ENCOUNTER — Ambulatory Visit: Payer: Medicare Other | Admitting: Physical Therapy

## 2012-10-14 DIAGNOSIS — G894 Chronic pain syndrome: Secondary | ICD-10-CM | POA: Diagnosis not present

## 2012-10-14 DIAGNOSIS — M25519 Pain in unspecified shoulder: Secondary | ICD-10-CM | POA: Diagnosis not present

## 2012-10-14 DIAGNOSIS — Z79899 Other long term (current) drug therapy: Secondary | ICD-10-CM | POA: Diagnosis not present

## 2012-10-14 DIAGNOSIS — M1990 Unspecified osteoarthritis, unspecified site: Secondary | ICD-10-CM | POA: Diagnosis not present

## 2012-10-26 DIAGNOSIS — Z79899 Other long term (current) drug therapy: Secondary | ICD-10-CM | POA: Diagnosis not present

## 2012-10-27 ENCOUNTER — Ambulatory Visit: Payer: Federal, State, Local not specified - PPO | Admitting: Physical Therapy

## 2012-10-27 DIAGNOSIS — M25519 Pain in unspecified shoulder: Secondary | ICD-10-CM

## 2012-10-27 DIAGNOSIS — M19019 Primary osteoarthritis, unspecified shoulder: Secondary | ICD-10-CM

## 2012-10-27 DIAGNOSIS — M6281 Muscle weakness (generalized): Secondary | ICD-10-CM

## 2012-10-27 DIAGNOSIS — M25619 Stiffness of unspecified shoulder, not elsewhere classified: Secondary | ICD-10-CM

## 2012-10-28 ENCOUNTER — Encounter: Payer: Federal, State, Local not specified - PPO | Admitting: Physical Therapy

## 2012-10-28 ENCOUNTER — Ambulatory Visit: Payer: Medicare Other | Admitting: Physical Therapy

## 2012-10-28 DIAGNOSIS — M25619 Stiffness of unspecified shoulder, not elsewhere classified: Secondary | ICD-10-CM

## 2012-10-28 DIAGNOSIS — M25519 Pain in unspecified shoulder: Secondary | ICD-10-CM

## 2012-10-28 DIAGNOSIS — M6281 Muscle weakness (generalized): Secondary | ICD-10-CM

## 2012-10-28 DIAGNOSIS — M19019 Primary osteoarthritis, unspecified shoulder: Secondary | ICD-10-CM

## 2012-10-29 DIAGNOSIS — M19019 Primary osteoarthritis, unspecified shoulder: Secondary | ICD-10-CM | POA: Diagnosis not present

## 2012-11-02 ENCOUNTER — Encounter: Payer: Medicare Other | Admitting: Physical Therapy

## 2012-11-02 DIAGNOSIS — M25519 Pain in unspecified shoulder: Secondary | ICD-10-CM

## 2012-11-02 DIAGNOSIS — I10 Essential (primary) hypertension: Secondary | ICD-10-CM | POA: Diagnosis not present

## 2012-11-02 DIAGNOSIS — M6281 Muscle weakness (generalized): Secondary | ICD-10-CM

## 2012-11-02 DIAGNOSIS — M159 Polyosteoarthritis, unspecified: Secondary | ICD-10-CM | POA: Diagnosis not present

## 2012-11-02 DIAGNOSIS — M25619 Stiffness of unspecified shoulder, not elsewhere classified: Secondary | ICD-10-CM

## 2012-11-02 DIAGNOSIS — M19019 Primary osteoarthritis, unspecified shoulder: Secondary | ICD-10-CM

## 2012-11-04 DIAGNOSIS — L680 Hirsutism: Secondary | ICD-10-CM | POA: Diagnosis not present

## 2012-11-04 DIAGNOSIS — E039 Hypothyroidism, unspecified: Secondary | ICD-10-CM | POA: Diagnosis not present

## 2012-11-05 ENCOUNTER — Other Ambulatory Visit: Payer: Self-pay | Admitting: Orthopedic Surgery

## 2012-11-05 ENCOUNTER — Encounter: Payer: Medicare Other | Admitting: Physical Therapy

## 2012-11-05 DIAGNOSIS — M25519 Pain in unspecified shoulder: Secondary | ICD-10-CM

## 2012-11-05 DIAGNOSIS — M6281 Muscle weakness (generalized): Secondary | ICD-10-CM

## 2012-11-05 DIAGNOSIS — M19019 Primary osteoarthritis, unspecified shoulder: Secondary | ICD-10-CM

## 2012-11-05 DIAGNOSIS — M25619 Stiffness of unspecified shoulder, not elsewhere classified: Secondary | ICD-10-CM

## 2012-11-09 ENCOUNTER — Encounter: Payer: Federal, State, Local not specified - PPO | Admitting: Physical Therapy

## 2012-11-09 DIAGNOSIS — M6281 Muscle weakness (generalized): Secondary | ICD-10-CM

## 2012-11-09 DIAGNOSIS — M25519 Pain in unspecified shoulder: Secondary | ICD-10-CM

## 2012-11-09 DIAGNOSIS — M19019 Primary osteoarthritis, unspecified shoulder: Secondary | ICD-10-CM

## 2012-11-09 DIAGNOSIS — M25619 Stiffness of unspecified shoulder, not elsewhere classified: Secondary | ICD-10-CM

## 2012-11-11 DIAGNOSIS — I10 Essential (primary) hypertension: Secondary | ICD-10-CM | POA: Diagnosis not present

## 2012-11-11 DIAGNOSIS — Z0389 Encounter for observation for other suspected diseases and conditions ruled out: Secondary | ICD-10-CM | POA: Diagnosis not present

## 2012-11-11 DIAGNOSIS — Z01818 Encounter for other preprocedural examination: Secondary | ICD-10-CM | POA: Diagnosis not present

## 2012-11-11 DIAGNOSIS — M25519 Pain in unspecified shoulder: Secondary | ICD-10-CM | POA: Diagnosis not present

## 2012-11-12 ENCOUNTER — Encounter: Payer: Medicare Other | Admitting: Physical Therapy

## 2012-11-12 DIAGNOSIS — M159 Polyosteoarthritis, unspecified: Secondary | ICD-10-CM | POA: Diagnosis not present

## 2012-11-12 DIAGNOSIS — G894 Chronic pain syndrome: Secondary | ICD-10-CM | POA: Diagnosis not present

## 2012-11-16 ENCOUNTER — Encounter: Payer: Federal, State, Local not specified - PPO | Admitting: Physical Therapy

## 2012-11-16 DIAGNOSIS — M19019 Primary osteoarthritis, unspecified shoulder: Secondary | ICD-10-CM

## 2012-11-16 DIAGNOSIS — M6281 Muscle weakness (generalized): Secondary | ICD-10-CM

## 2012-11-16 DIAGNOSIS — M25619 Stiffness of unspecified shoulder, not elsewhere classified: Secondary | ICD-10-CM

## 2012-11-16 DIAGNOSIS — M25519 Pain in unspecified shoulder: Secondary | ICD-10-CM

## 2012-11-17 ENCOUNTER — Encounter (HOSPITAL_COMMUNITY): Payer: Self-pay | Admitting: Pharmacy Technician

## 2012-11-18 DIAGNOSIS — R82998 Other abnormal findings in urine: Secondary | ICD-10-CM | POA: Diagnosis not present

## 2012-11-19 ENCOUNTER — Encounter (HOSPITAL_COMMUNITY)
Admission: RE | Admit: 2012-11-19 | Discharge: 2012-11-19 | Disposition: A | Discharge status: Home / Self Care | Payer: Medicare Other | Source: Ambulatory Visit | Attending: Orthopedic Surgery | Admitting: Orthopedic Surgery

## 2012-11-19 ENCOUNTER — Encounter: Payer: Medicare Other | Admitting: Physical Therapy

## 2012-11-19 ENCOUNTER — Encounter (HOSPITAL_COMMUNITY): Payer: Self-pay

## 2012-11-19 ENCOUNTER — Ambulatory Visit (HOSPITAL_COMMUNITY)
Admission: RE | Admit: 2012-11-19 | Discharge: 2012-11-19 | Disposition: A | Discharge status: Home / Self Care | Payer: Medicare Other | Source: Ambulatory Visit | Attending: Orthopedic Surgery | Admitting: Orthopedic Surgery

## 2012-11-19 DIAGNOSIS — Z01818 Encounter for other preprocedural examination: Secondary | ICD-10-CM | POA: Insufficient documentation

## 2012-11-19 DIAGNOSIS — Z01811 Encounter for preprocedural respiratory examination: Secondary | ICD-10-CM | POA: Insufficient documentation

## 2012-11-19 DIAGNOSIS — M19019 Primary osteoarthritis, unspecified shoulder: Secondary | ICD-10-CM | POA: Diagnosis not present

## 2012-11-19 DIAGNOSIS — Z01812 Encounter for preprocedural laboratory examination: Secondary | ICD-10-CM | POA: Insufficient documentation

## 2012-11-19 LAB — CBC WITH DIFFERENTIAL/PLATELET
Basophils Absolute: 0 10*3/uL (ref 0.0–0.1)
Eosinophils Absolute: 0.1 10*3/uL (ref 0.0–0.7)
Eosinophils Relative: 1 % (ref 0–5)
Lymphocytes Relative: 33 % (ref 12–46)
MCH: 29.3 pg (ref 26.0–34.0)
MCV: 89.1 fL (ref 78.0–100.0)
Platelets: 354 10*3/uL (ref 150–400)
RDW: 13.1 % (ref 11.5–15.5)
WBC: 10.3 10*3/uL (ref 4.0–10.5)

## 2012-11-19 LAB — URINALYSIS, ROUTINE W REFLEX MICROSCOPIC
Protein, ur: NEGATIVE mg/dL
Urobilinogen, UA: 0.2 mg/dL (ref 0.0–1.0)

## 2012-11-19 LAB — COMPREHENSIVE METABOLIC PANEL
ALT: 18 U/L (ref 0–35)
AST: 24 U/L (ref 0–37)
Albumin: 4.3 g/dL (ref 3.5–5.2)
Calcium: 9.6 mg/dL (ref 8.4–10.5)
Sodium: 136 mEq/L (ref 135–145)
Total Protein: 7.8 g/dL (ref 6.0–8.3)

## 2012-11-19 LAB — TYPE AND SCREEN
ABO/RH(D): A POS
Antibody Screen: NEGATIVE

## 2012-11-19 LAB — URINE MICROSCOPIC-ADD ON

## 2012-11-19 NOTE — Progress Notes (Signed)
EKG,OV and any cardiac testing requested from Dr. Norris Cross office.

## 2012-11-19 NOTE — Pre-Procedure Instructions (Addendum)
Sally [name]  11/19/2012   Your procedure is scheduled on:  11-25-2012    Thursday   Report to [facility] at 9:00 AM   Call this number if you have problems the morning of surgery: [phone]   Remember:   Do not eat food or drink liquids after midnight.   Take these medicines the morning of surgery with A SIP OF WATER: pain medication as needed,Armour   Do not wear jewelry, make-up or nail polish.  Do not wear lotions, powders, or perfumes.  Do not shave 48 hours prior to surgery.   Do not bring valuables to the hospital.  [facility] is not responsible for any belongings or valuables.  Contacts, dentures or bridgework may not be worn into surgery.   Leave suitcase in the car. After surgery it may be brought to your room.   For patients admitted to the hospital, checkout time is 11:00 AM the day of discharge.   Patients discharged the day of surgery will not be allowed to drive home.    Special Instructions: Shower using CHG 2 nights before surgery and the night before surgery.  If you shower the day of surgery use CHG.  Use special wash - you have one bottle of CHG for all showers.  You should use approximately 1/3 of the bottle for each shower.   Please read over the following fact sheets that you were given: Pain Booklet, Coughing and Deep Breathing, Blood Transfusion Information and Surgical Site Infection Prevention

## 2012-11-22 DIAGNOSIS — Z79899 Other long term (current) drug therapy: Secondary | ICD-10-CM | POA: Diagnosis not present

## 2012-11-24 MED ORDER — CEFAZOLIN SODIUM-DEXTROSE 2-3 GM-% IV SOLR
2.0000 g | INTRAVENOUS | Status: AC
  Administered 2012-11-25: 2 g via INTRAVENOUS
  Filled 2012-11-24: qty 50

## 2012-11-25 ENCOUNTER — Encounter (HOSPITAL_COMMUNITY): Payer: Self-pay | Admitting: *Deleted

## 2012-11-25 ENCOUNTER — Inpatient Hospital Stay (HOSPITAL_COMMUNITY): Payer: Medicare Other | Admitting: Certified Registered"

## 2012-11-25 ENCOUNTER — Inpatient Hospital Stay (HOSPITAL_COMMUNITY)
Admission: RE | Admit: 2012-11-25 | Discharge: 2012-11-27 | DRG: 483 | Disposition: A | Discharge status: Home / Self Care | Payer: Medicare Other | Source: Ambulatory Visit | Attending: Orthopedic Surgery | Admitting: Orthopedic Surgery

## 2012-11-25 ENCOUNTER — Inpatient Hospital Stay (HOSPITAL_COMMUNITY): Payer: Medicare Other

## 2012-11-25 ENCOUNTER — Encounter (HOSPITAL_COMMUNITY)
Admission: RE | Disposition: A | Discharge status: Home / Self Care | Payer: Self-pay | Source: Ambulatory Visit | Attending: Orthopedic Surgery

## 2012-11-25 ENCOUNTER — Encounter (HOSPITAL_COMMUNITY): Payer: Self-pay | Admitting: Certified Registered"

## 2012-11-25 DIAGNOSIS — I10 Essential (primary) hypertension: Secondary | ICD-10-CM | POA: Diagnosis not present

## 2012-11-25 DIAGNOSIS — Z8261 Family history of arthritis: Secondary | ICD-10-CM

## 2012-11-25 DIAGNOSIS — M19019 Primary osteoarthritis, unspecified shoulder: Principal | ICD-10-CM | POA: Diagnosis present

## 2012-11-25 DIAGNOSIS — Z96619 Presence of unspecified artificial shoulder joint: Secondary | ICD-10-CM | POA: Diagnosis not present

## 2012-11-25 DIAGNOSIS — Z860100 Personal history of colon polyps, unspecified: Secondary | ICD-10-CM

## 2012-11-25 DIAGNOSIS — G894 Chronic pain syndrome: Secondary | ICD-10-CM | POA: Diagnosis present

## 2012-11-25 DIAGNOSIS — Z9089 Acquired absence of other organs: Secondary | ICD-10-CM | POA: Diagnosis not present

## 2012-11-25 DIAGNOSIS — N390 Urinary tract infection, site not specified: Secondary | ICD-10-CM | POA: Diagnosis not present

## 2012-11-25 DIAGNOSIS — M25519 Pain in unspecified shoulder: Secondary | ICD-10-CM | POA: Diagnosis not present

## 2012-11-25 DIAGNOSIS — Z87891 Personal history of nicotine dependence: Secondary | ICD-10-CM | POA: Diagnosis not present

## 2012-11-25 DIAGNOSIS — E039 Hypothyroidism, unspecified: Secondary | ICD-10-CM | POA: Diagnosis present

## 2012-11-25 DIAGNOSIS — G8929 Other chronic pain: Secondary | ICD-10-CM | POA: Diagnosis not present

## 2012-11-25 DIAGNOSIS — G8918 Other acute postprocedural pain: Secondary | ICD-10-CM | POA: Diagnosis not present

## 2012-11-25 DIAGNOSIS — E669 Obesity, unspecified: Secondary | ICD-10-CM | POA: Diagnosis present

## 2012-11-25 DIAGNOSIS — Z8249 Family history of ischemic heart disease and other diseases of the circulatory system: Secondary | ICD-10-CM

## 2012-11-25 DIAGNOSIS — Z800 Family history of malignant neoplasm of digestive organs: Secondary | ICD-10-CM | POA: Diagnosis not present

## 2012-11-25 DIAGNOSIS — Z471 Aftercare following joint replacement surgery: Secondary | ICD-10-CM | POA: Diagnosis not present

## 2012-11-25 DIAGNOSIS — IMO0001 Reserved for inherently not codable concepts without codable children: Secondary | ICD-10-CM | POA: Diagnosis not present

## 2012-11-25 DIAGNOSIS — Z96612 Presence of left artificial shoulder joint: Secondary | ICD-10-CM

## 2012-11-25 HISTORY — PX: TOTAL SHOULDER ARTHROPLASTY: SHX126

## 2012-11-25 SURGERY — ARTHROPLASTY, SHOULDER, TOTAL
Anesthesia: General | Site: Shoulder | Laterality: Left | Wound class: Clean

## 2012-11-25 MED ORDER — SODIUM CHLORIDE 0.9 % IR SOLN
Status: DC | PRN
  Administered 2012-11-25: 3000 mL

## 2012-11-25 MED ORDER — LIDOCAINE HCL (CARDIAC) 20 MG/ML IV SOLN
INTRAVENOUS | Status: DC | PRN
  Administered 2012-11-25: 80 mg via INTRAVENOUS

## 2012-11-25 MED ORDER — ONDANSETRON HCL 4 MG/2ML IJ SOLN: 4.0000 mg | Freq: Once | INTRAMUSCULAR | Status: DC | PRN

## 2012-11-25 MED ORDER — THYROID 120 MG PO TABS
120.0000 mg | ORAL_TABLET | Freq: Every day | ORAL | Status: DC
  Administered 2012-11-26 – 2012-11-27 (×2): 120 mg via ORAL
  Filled 2012-11-25 (×3): qty 1

## 2012-11-25 MED ORDER — ZOLPIDEM TARTRATE 5 MG PO TABS: 5.0000 mg | ORAL_TABLET | Freq: Every evening | ORAL | Status: DC | PRN

## 2012-11-25 MED ORDER — HYDROCODONE-ACETAMINOPHEN 5-325 MG PO TABS: 1.0000 | ORAL_TABLET | ORAL | Status: DC | PRN

## 2012-11-25 MED ORDER — OXYCODONE-ACETAMINOPHEN 5-325 MG PO TABS
ORAL_TABLET | ORAL | Status: AC
  Filled 2012-11-25: qty 1

## 2012-11-25 MED ORDER — MIDAZOLAM HCL 2 MG/2ML IJ SOLN: 1.0000 mg | INTRAMUSCULAR | Status: DC | PRN

## 2012-11-25 MED ORDER — NEOSTIGMINE METHYLSULFATE 1 MG/ML IJ SOLN
INTRAMUSCULAR | Status: DC | PRN
  Administered 2012-11-25: 3 mg via INTRAVENOUS

## 2012-11-25 MED ORDER — OXYCODONE-ACETAMINOPHEN 5-325 MG PO TABS
ORAL_TABLET | ORAL | Status: AC
  Filled 2012-11-25: qty 2

## 2012-11-25 MED ORDER — POLYETHYLENE GLYCOL 3350 17 G PO PACK: 17.0000 g | PACK | Freq: Every day | ORAL | Status: DC | PRN

## 2012-11-25 MED ORDER — SODIUM CHLORIDE 0.9 % IV SOLN: INTRAVENOUS | Status: DC

## 2012-11-25 MED ORDER — CEFAZOLIN SODIUM-DEXTROSE 2-3 GM-% IV SOLR
2.0000 g | Freq: Four times a day (QID) | INTRAVENOUS | Status: AC
  Administered 2012-11-25 – 2012-11-26 (×3): 2 g via INTRAVENOUS
  Filled 2012-11-25 (×3): qty 50

## 2012-11-25 MED ORDER — PROPOFOL 10 MG/ML IV BOLUS
INTRAVENOUS | Status: DC | PRN
  Administered 2012-11-25: 200 mg via INTRAVENOUS
  Administered 2012-11-25: 30 mg via INTRAVENOUS

## 2012-11-25 MED ORDER — HEMOSTATIC AGENTS (NO CHARGE) OPTIME
TOPICAL | Status: DC | PRN
  Administered 2012-11-25: 1 via TOPICAL

## 2012-11-25 MED ORDER — OXYCODONE-ACETAMINOPHEN 5-325 MG PO TABS
1.0000 | ORAL_TABLET | ORAL | Status: DC | PRN
  Administered 2012-11-25 – 2012-11-27 (×9): 2 via ORAL
  Filled 2012-11-25 (×8): qty 2

## 2012-11-25 MED ORDER — FENTANYL CITRATE 0.05 MG/ML IJ SOLN
50.0000 ug | INTRAMUSCULAR | Status: DC | PRN
  Administered 2012-11-25: 100 ug via INTRAVENOUS

## 2012-11-25 MED ORDER — LACTATED RINGERS IV SOLN
INTRAVENOUS | Status: DC
  Administered 2012-11-25 (×2): via INTRAVENOUS

## 2012-11-25 MED ORDER — ACETAMINOPHEN 650 MG RE SUPP: 650.0000 mg | Freq: Four times a day (QID) | RECTAL | Status: DC | PRN

## 2012-11-25 MED ORDER — PROMETHAZINE HCL 25 MG/ML IJ SOLN
INTRAMUSCULAR | Status: AC
  Filled 2012-11-25: qty 1

## 2012-11-25 MED ORDER — DIPHENHYDRAMINE HCL 12.5 MG/5ML PO ELIX: 12.5000 mg | ORAL_SOLUTION | ORAL | Status: DC | PRN

## 2012-11-25 MED ORDER — SIMETHICONE 80 MG PO CHEW
80.0000 mg | CHEWABLE_TABLET | Freq: Three times a day (TID) | ORAL | Status: DC
  Administered 2012-11-26 – 2012-11-27 (×4): 80 mg via ORAL
  Filled 2012-11-25 (×7): qty 1

## 2012-11-25 MED ORDER — MENTHOL 3 MG MT LOZG: 1.0000 | LOZENGE | OROMUCOSAL | Status: DC | PRN

## 2012-11-25 MED ORDER — 0.9 % SODIUM CHLORIDE (POUR BTL) OPTIME
TOPICAL | Status: DC | PRN
  Administered 2012-11-25: 1000 mL

## 2012-11-25 MED ORDER — HYDROMORPHONE HCL PF 1 MG/ML IJ SOLN
INTRAMUSCULAR | Status: AC
  Filled 2012-11-25: qty 1

## 2012-11-25 MED ORDER — ACETAMINOPHEN 325 MG PO TABS: 650.0000 mg | ORAL_TABLET | Freq: Four times a day (QID) | ORAL | Status: DC | PRN

## 2012-11-25 MED ORDER — FLEET ENEMA 7-19 GM/118ML RE ENEM: 1.0000 | ENEMA | Freq: Once | RECTAL | Status: AC | PRN

## 2012-11-25 MED ORDER — GLYCOPYRROLATE 0.2 MG/ML IJ SOLN
INTRAMUSCULAR | Status: DC | PRN
  Administered 2012-11-25: 0.4 mg via INTRAVENOUS

## 2012-11-25 MED ORDER — OXYCODONE HCL 5 MG PO TABS
5.0000 mg | ORAL_TABLET | ORAL | Status: DC | PRN
  Administered 2012-11-25 – 2012-11-26 (×5): 10 mg via ORAL
  Filled 2012-11-25 (×6): qty 2

## 2012-11-25 MED ORDER — FENTANYL CITRATE 0.05 MG/ML IJ SOLN
INTRAMUSCULAR | Status: AC
  Administered 2012-11-25: 100 ug via INTRAVENOUS
  Filled 2012-11-25: qty 2

## 2012-11-25 MED ORDER — POVIDONE-IODINE 7.5 % EX SOLN
Freq: Once | CUTANEOUS | Status: DC
  Filled 2012-11-25: qty 118

## 2012-11-25 MED ORDER — FENTANYL CITRATE 0.05 MG/ML IJ SOLN
INTRAMUSCULAR | Status: DC | PRN
  Administered 2012-11-25: 100 ug via INTRAVENOUS
  Administered 2012-11-25 (×2): 50 ug via INTRAVENOUS
  Administered 2012-11-25 (×3): 100 ug via INTRAVENOUS

## 2012-11-25 MED ORDER — MORPHINE SULFATE 2 MG/ML IJ SOLN
1.0000 mg | INTRAMUSCULAR | Status: DC | PRN
  Administered 2012-11-25 – 2012-11-26 (×4): 2 mg via INTRAVENOUS
  Filled 2012-11-25 (×4): qty 1

## 2012-11-25 MED ORDER — BISACODYL 5 MG PO TBEC: 5.0000 mg | DELAYED_RELEASE_TABLET | Freq: Every day | ORAL | Status: DC | PRN

## 2012-11-25 MED ORDER — IRBESARTAN 150 MG PO TABS
150.0000 mg | ORAL_TABLET | Freq: Every day | ORAL | Status: DC
  Administered 2012-11-27: 150 mg via ORAL
  Filled 2012-11-25 (×2): qty 1

## 2012-11-25 MED ORDER — ASPIRIN EC 325 MG PO TBEC
325.0000 mg | DELAYED_RELEASE_TABLET | Freq: Two times a day (BID) | ORAL | Status: DC
  Administered 2012-11-25 – 2012-11-27 (×4): 325 mg via ORAL
  Filled 2012-11-25 (×6): qty 1

## 2012-11-25 MED ORDER — ROCURONIUM BROMIDE 100 MG/10ML IV SOLN
INTRAVENOUS | Status: DC | PRN
  Administered 2012-11-25: 50 mg via INTRAVENOUS

## 2012-11-25 MED ORDER — DOCUSATE SODIUM 100 MG PO CAPS
100.0000 mg | ORAL_CAPSULE | Freq: Two times a day (BID) | ORAL | Status: DC
  Administered 2012-11-26 – 2012-11-27 (×3): 100 mg via ORAL
  Filled 2012-11-25 (×5): qty 1

## 2012-11-25 MED ORDER — ALUMINUM HYDROXIDE GEL 320 MG/5ML PO SUSP
15.0000 mL | ORAL | Status: DC | PRN
  Filled 2012-11-25: qty 30

## 2012-11-25 MED ORDER — OXYCODONE HCL ER 20 MG PO T12A
20.0000 mg | EXTENDED_RELEASE_TABLET | Freq: Two times a day (BID) | ORAL | Status: DC
  Administered 2012-11-25 – 2012-11-27 (×4): 20 mg via ORAL
  Filled 2012-11-25 (×4): qty 2

## 2012-11-25 MED ORDER — ONDANSETRON HCL 4 MG/2ML IJ SOLN
INTRAMUSCULAR | Status: DC | PRN
  Administered 2012-11-25: 4 mg via INTRAVENOUS

## 2012-11-25 MED ORDER — MIDAZOLAM HCL 5 MG/5ML IJ SOLN
INTRAMUSCULAR | Status: DC | PRN
  Administered 2012-11-25: 2 mg via INTRAVENOUS

## 2012-11-25 MED ORDER — PHENOL 1.4 % MT LIQD: 1.0000 | OROMUCOSAL | Status: DC | PRN

## 2012-11-25 MED ORDER — PROMETHAZINE HCL 25 MG/ML IJ SOLN
25.0000 mg | Freq: Four times a day (QID) | INTRAMUSCULAR | Status: DC | PRN
  Administered 2012-11-25: 0.625 mg via INTRAVENOUS

## 2012-11-25 MED ORDER — HYDROMORPHONE HCL PF 1 MG/ML IJ SOLN
0.2500 mg | INTRAMUSCULAR | Status: DC | PRN
  Administered 2012-11-25 (×4): 0.5 mg via INTRAVENOUS

## 2012-11-25 SURGICAL SUPPLY — 71 items
ASSEMBLY NECK TAPER FIXED 135 (Orthopedic Implant) ×2 IMPLANT
BIT DRILL 5/64X5 DISP (BIT) IMPLANT
BLADE SAW SAG 73X25 THK (BLADE) ×1
BLADE SAW SGTL 73X25 THK (BLADE) ×1 IMPLANT
BLADE SURG 15 STRL LF DISP TIS (BLADE) ×1 IMPLANT
BLADE SURG 15 STRL SS (BLADE) ×1
BOWL SMART MIX CTS (DISPOSABLE) IMPLANT
CEMENT BONE DEPUY (Cement) ×2 IMPLANT
CHLORAPREP W/TINT 26ML (MISCELLANEOUS) ×2 IMPLANT
CLOTH BEACON ORANGE TIMEOUT ST (SAFETY) IMPLANT
CLSR STERI-STRIP ANTIMIC 1/2X4 (GAUZE/BANDAGES/DRESSINGS) ×2 IMPLANT
COVER SURGICAL LIGHT HANDLE (MISCELLANEOUS) ×2 IMPLANT
DRAPE INCISE IOBAN 66X45 STRL (DRAPES) ×4 IMPLANT
DRAPE SURG 17X23 STRL (DRAPES) ×2 IMPLANT
DRAPE U-SHAPE 47X51 STRL (DRAPES) ×2 IMPLANT
DRSG ADAPTIC 3X8 NADH LF (GAUZE/BANDAGES/DRESSINGS) IMPLANT
DRSG MEPILEX BORDER 4X8 (GAUZE/BANDAGES/DRESSINGS) ×2 IMPLANT
DRSG PAD ABDOMINAL 8X10 ST (GAUZE/BANDAGES/DRESSINGS) IMPLANT
ELECT BLADE 4.0 EZ CLEAN MEGAD (MISCELLANEOUS) ×2
ELECT REM PT RETURN 9FT ADLT (ELECTROSURGICAL) ×2
ELECTRODE BLDE 4.0 EZ CLN MEGD (MISCELLANEOUS) ×1 IMPLANT
ELECTRODE REM PT RTRN 9FT ADLT (ELECTROSURGICAL) ×1 IMPLANT
EVACUATOR 1/8 PVC DRAIN (DRAIN) IMPLANT
GLENOID ANCHOR PEG CROSSLK 44 (Orthopedic Implant) ×2 IMPLANT
GLOVE BIO SURGEON STRL SZ7 (GLOVE) ×2 IMPLANT
GLOVE BIO SURGEON STRL SZ7.5 (GLOVE) IMPLANT
GLOVE BIOGEL PI IND STRL 7.0 (GLOVE) ×2 IMPLANT
GLOVE BIOGEL PI IND STRL 8 (GLOVE) IMPLANT
GLOVE BIOGEL PI INDICATOR 7.0 (GLOVE) ×2
GLOVE BIOGEL PI INDICATOR 8 (GLOVE)
GLOVE ECLIPSE 6.5 STRL STRAW (GLOVE) ×2 IMPLANT
GOWN PREVENTION PLUS LG XLONG (DISPOSABLE) ×2 IMPLANT
GOWN STRL NON-REIN LRG LVL3 (GOWN DISPOSABLE) ×2 IMPLANT
GOWN STRL REIN XL XLG (GOWN DISPOSABLE) ×6 IMPLANT
HANDPIECE INTERPULSE COAX TIP (DISPOSABLE) ×1
HEAD HUMERAL ECC 44X18MM SHLDR (Head) ×2 IMPLANT
HEMOSTAT SURGICEL 2X14 (HEMOSTASIS) ×2 IMPLANT
HOOD PEEL AWAY FACE SHEILD DIS (HOOD) ×4 IMPLANT
KIT BASIN OR (CUSTOM PROCEDURE TRAY) ×2 IMPLANT
KIT ROOM TURNOVER OR (KITS) ×2 IMPLANT
MANIFOLD NEPTUNE II (INSTRUMENTS) ×2 IMPLANT
NEEDLE MAYO TROCAR (NEEDLE) ×2 IMPLANT
NS IRRIG 1000ML POUR BTL (IV SOLUTION) ×2 IMPLANT
PACK SHOULDER (CUSTOM PROCEDURE TRAY) ×2 IMPLANT
PAD ARMBOARD 7.5X6 YLW CONV (MISCELLANEOUS) ×4 IMPLANT
PIN METAGLENE 2.5 (PIN) ×2 IMPLANT
RETRIEVER SUT HEWSON (MISCELLANEOUS) ×2 IMPLANT
SET HNDPC FAN SPRY TIP SCT (DISPOSABLE) ×1 IMPLANT
SLING ARM IMMOBILIZER LRG (SOFTGOODS) ×2 IMPLANT
SLING ARM IMMOBILIZER MED (SOFTGOODS) IMPLANT
SMARTMIX MINI TOWER (MISCELLANEOUS) ×2
SPONGE GAUZE 4X4 12PLY (GAUZE/BANDAGES/DRESSINGS) ×2 IMPLANT
SPONGE LAP 18X18 X RAY DECT (DISPOSABLE) ×4 IMPLANT
SPONGE LAP 4X18 X RAY DECT (DISPOSABLE) ×6 IMPLANT
STEM AP PC10 (Stem) ×2 IMPLANT
STRIP CLOSURE SKIN 1/2X4 (GAUZE/BANDAGES/DRESSINGS) ×2 IMPLANT
SUCTION FRAZIER TIP 10 FR DISP (SUCTIONS) ×2 IMPLANT
SUPPORT WRAP ARM LG (MISCELLANEOUS) ×2 IMPLANT
SUT ETHIBOND 2 OS 4 DA (SUTURE) IMPLANT
SUT ETHIBOND NAB CT1 #1 30IN (SUTURE) ×6 IMPLANT
SUT FIBERWIRE #2 38 T-5 BLUE (SUTURE) ×6
SUT MNCRL AB 4-0 PS2 18 (SUTURE) ×2 IMPLANT
SUT VIC AB 2-0 CT1 27 (SUTURE) ×2
SUT VIC AB 2-0 CT1 TAPERPNT 27 (SUTURE) ×2 IMPLANT
SUTURE FIBERWR #2 38 T-5 BLUE (SUTURE) ×3 IMPLANT
SYR CONTROL 10ML LL (SYRINGE) IMPLANT
TOWEL OR 17X24 6PK STRL BLUE (TOWEL DISPOSABLE) ×2 IMPLANT
TOWEL OR 17X26 10 PK STRL BLUE (TOWEL DISPOSABLE) ×2 IMPLANT
TOWER SMARTMIX MINI (MISCELLANEOUS) ×1 IMPLANT
TRAY FOLEY CATH 16FRSI W/METER (SET/KITS/TRAYS/PACK) IMPLANT
WATER STERILE IRR 1000ML POUR (IV SOLUTION) ×2 IMPLANT

## 2012-11-25 NOTE — Progress Notes (Signed)
UR COMPLETED  

## 2012-11-25 NOTE — Transfer of Care (Signed)
Immediate Anesthesia Transfer of Care Note  Patient: Sally [name]  Procedure(s) Performed: Procedure(s) with comments: TOTAL SHOULDER ARTHROPLASTY (Left) - LEFT TOTAL SHOULDER ARTHROPLASTY  Patient Location: PACU  Anesthesia Type:General and Regional  Level of Consciousness: awake, alert  and oriented  Airway & Oxygen Therapy: Patient Spontanous Breathing and Patient connected to nasal cannula oxygen  Post-op Assessment: Report given to PACU RN and Post -op Vital signs reviewed and stable  Post vital signs: Reviewed and stable  Complications: No apparent anesthesia complications

## 2012-11-25 NOTE — Anesthesia Postprocedure Evaluation (Signed)
Anesthesia Post Note  Patient: Sally [name]  Procedure(s) Performed: Procedure(s) (LRB): TOTAL SHOULDER ARTHROPLASTY (Left)  Anesthesia type: general  Patient location: PACU  Post pain: Pain level controlled  Post assessment: Patient's Cardiovascular Status Stable  Last Vitals:  Filed Vitals:   11/25/12 1525  BP: 140/75  Pulse: 76  Temp: 36.9 C  Resp: 14    Post vital signs: Reviewed and stable  Level of consciousness: sedated  Complications: No apparent anesthesia complications

## 2012-11-25 NOTE — Anesthesia Preprocedure Evaluation (Signed)
Anesthesia Evaluation  Patient identified by MRN, date of birth, ID band Patient awake    Reviewed: Allergy & Precautions, H&P , NPO status , Patient's Chart, lab work & pertinent test results  Airway       Dental   Pulmonary          Cardiovascular hypertension,     Neuro/Psych  Neuromuscular disease    GI/Hepatic   Endo/Other  Hypothyroidism   Renal/GU      Musculoskeletal  (+) Fibromyalgia -  Abdominal   Peds  Hematology   Anesthesia Other Findings   Reproductive/Obstetrics                           Anesthesia Physical Anesthesia Plan  ASA: II  Anesthesia Plan: General   Post-op Pain Management:    Induction: Intravenous  Airway Management Planned: Oral ETT  Additional Equipment:   Intra-op Plan:   Post-operative Plan: Extubation in OR  Informed Consent: I have reviewed the patients History and Physical, chart, labs and discussed the procedure including the risks, benefits and alternatives for the proposed anesthesia with the patient or authorized representative who has indicated his/her understanding and acceptance.     Plan Discussed with: CRNA, Anesthesiologist and Surgeon  Anesthesia Plan Comments:         Anesthesia Quick Evaluation

## 2012-11-25 NOTE — H&P (Signed)
Sally [name] is an 67 y.o. female.   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  Failed activity modification, multiple injections, NSAIDs and narcotic pain management.  Past Medical History  Diagnosis Date  . Fibromyalgia   . Chronic pain syndrome   . Arthritis   . Hx of colonic polyps   . Hypertension   . Obesity   . Chronic UTI   . Allergy   . Hypothyroidism     Past Surgical History  Procedure Laterality Date  . Cesarean section      x 2   . Cholecystectomy  1982  . Tonsillectomy  1959  . Foot surgery  1992  . Knee surgery  1992  . Appendectomy  1965  . Abdominal hysterectomy  1990    Family History  Problem Relation Age of Onset  . Hypertension      Family history  . Thyroid disease      family history  . Cancer Mother 16    pancratic  . Cancer Father 34    hematologic malignancy   . Arthritis Sister   . Arthritis Sister    Social History:  reports that she quit smoking about 42 years ago. Her smoking use included Cigarettes. She has a 7 pack-year smoking history. She does not have any smokeless tobacco history on file. She reports that she does not drink alcohol or use illicit drugs.  Allergies:  Allergies  Allergen Reactions  . Codeine Nausea Only  . Doxycycline Nausea Only    Medications Prior to Admission  Medication Sig Dispense Refill  . Ascorbic Acid (VITAMIN C PO) Take 1 tablet by mouth daily.      . Aspirin Buf,CaCarb-MgCarb-MgO, (BUFFERIN PO) Take 2 tablets by mouth daily.      . B Complex Vitamins (VITAMIN-B COMPLEX PO) Take 1 tablet by mouth daily.      Marland Kitchen BIOTIN 5000 PO Take 5,000 Units by mouth daily.      . Cholecalciferol (VITAMIN D-3) 5000 UNITS TABS Take 1 capsule by mouth daily.      . Diethylpropion HCl (TENUATE PO) Take 37.5 mg by mouth 3 (three) times daily.       Marland Kitchen OVER THE COUNTER MEDICATION Take 2 tablets by mouth  daily. Tumeric      . OVER THE COUNTER MEDICATION Take 125 mg by mouth daily. DHEA      . OxyCODONE (OXYCONTIN) 20 mg T12A 12 hr tablet Take 20 mg by mouth every 12 (twelve) hours.      Marland Kitchen oxyCODONE-acetaminophen (PERCOCET) 10-325 MG per tablet Take 1.5 tablets by mouth daily.      Marland Kitchen telmisartan (MICARDIS) 40 MG tablet Take 40 mg by mouth daily.      Marland Kitchen thyroid (ARMOUR) 60 MG tablet Take 120 mg by mouth daily before breakfast.        No results found for this or any previous visit (from the past 48 hour(s)). No results found.  Review of Systems  All other systems reviewed and are negative.    Blood pressure 174/91, pulse 78, temperature 97.8 F (36.6 C), temperature source Oral, resp. rate 20, SpO2 97.00%. Physical Exam  Constitutional: She is oriented to person, place, and time. She appears well-developed and well-nourished.  HENT:  Head: Atraumatic.  Eyes: EOM are normal.  Cardiovascular: Intact distal pulses.   Respiratory: Effort normal.  Musculoskeletal:  Left shoulder: She exhibits decreased range of motion, crepitus and pain. She exhibits normal strength.  Neurological: She is alert and oriented to person, place, and time.  Skin: Skin is warm and dry.  Psychiatric: She has a normal mood and affect.     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 Risks / benefits of surgery discussed Consent on chart  NPO for OR Preop antibiotics   Sally [name] WILLIAM 11/25/2012, 9:30 AM

## 2012-11-25 NOTE — Anesthesia Procedure Notes (Addendum)
Anesthesia Regional Block:  Interscalene brachial plexus block  Pre-Anesthetic Checklist: ,, timeout performed, Correct Patient, Correct Site, Correct Laterality, Correct Procedure, Correct Position, site marked, Risks and benefits discussed,  Surgical consent,  Pre-op evaluation,  At surgeon's request and post-op pain management  Laterality: Left  Prep: Maximum Sterile Barrier Precautions used, chloraprep and alcohol swabs       Needles:  Injection technique: Single-shot  Needle Type: Stimulator Needle - 40        Needle insertion depth: 4 cm   Additional Needles:  Procedures: nerve stimulator Interscalene brachial plexus block  Nerve Stimulator or Paresthesia:  Response: 0.5 mA, 0.1 ms, 4 cm  Additional Responses:   Narrative:  Start time: 11/25/2012 9:55 AM End time: 11/25/2012 10:00 AM Injection made incrementally with aspirations every 5 mL. Anesthesiologist: Maren Beach MD  Additional Notes: Pt accepts procedure and risks. 0.5% Marcaine 20cc w/ epi w/o difficulty or discomfort. GES   Procedure Name: Intubation Date/Time: 11/25/2012 10:26 AM Performed by: Arlice Colt B Pre-anesthesia Checklist: Patient identified, Emergency Drugs available, Suction available, Patient being monitored and Timeout performed Patient Re-evaluated:Patient Re-evaluated prior to inductionOxygen Delivery Method: Circle system utilized Preoxygenation: Pre-oxygenation with 100% oxygen Intubation Type: IV induction Ventilation: Mask ventilation without difficulty Laryngoscope Size: Mac and 3 Grade View: Grade I Tube type: Oral Tube size: 7.5 mm Number of attempts: 1 Airway Equipment and Method: Stylet Placement Confirmation: ETT inserted through vocal cords under direct vision,  positive ETCO2 and breath sounds checked- equal and bilateral Secured at: 21 cm Tube secured with: Tape Dental Injury: Teeth and Oropharynx as per pre-operative assessment

## 2012-11-25 NOTE — Op Note (Signed)
Procedure(s): TOTAL SHOULDER ARTHROPLASTY Procedure Note  Sally [name] female 67 y.o. 11/25/2012  Procedure(s) and Anesthesia Type:    * TOTAL SHOULDER ARTHROPLASTY - General  Surgeon(s) and Role:    * Mable Paris, MD - Primary   Indications:  67 y.o. female  With endstage left shoulder arthritis. Pain and dysfunction interfered with quality of life and nonoperative treatment with activity modification, NSAIDS and injections failed.     Surgeon: Mable Paris   Assistants: Damita Lack PA-C [facility] was present and scrubbed throughout the procedure and was essential in positioning, retraction, exposure, and closure)  Anesthesia: General endotracheal anesthesia with preoperative interscalene block    Procedure Detail  TOTAL SHOULDER ARTHROPLASTY  Findings: DePuy size 10 proximally porous coated stem with a 44 x 18 eccentric head, 44 cemented anchor peg glenoid. Excellent stability. Bone quality poor. A lesser tuberosity osteotomy was performed and repaired at the conclusion of the procedure. Preoperative external rotation is 25, postoperative external rotation was 65.  Estimated Blood Loss:  200 mL         Drains: 1 medium hemovac  Blood Given: none          Specimens: none        Complications:  * No complications entered in OR log *         Disposition: PACU - hemodynamically stable.         Condition: stable    Procedure:   The patient was identified in the preoperative holding area where I personally marked the operative extremity after verifying with the patient and consent. She  was taken to the operating room where She was transferred to the   operative table.  The patient received an interscalene block in   the holding area by the attending anesthesiologist.  General anesthesia was induced   in the operating room without complication.  The patient did receive IV  Ancef prior to the commencement of the procedure.  The patient  was   placed in the beach-chair position with the back raised about 30   degrees.  The nonoperative extremity and head and neck were carefully   positioned and padded protecting against neurovascular compromise.  The   left upper extremity was then prepped and draped in the standard sterile   fashion.    The appropriate operative time-out was performed with   Anesthesia, the perioperative staff, as well as myself and we all agreed   that the left side was the correct operative site.  An approximately   10 cm incision was made from the tip of the coracoid to the center point of the   humerus at the level of the axilla.  Dissection was carried down sharply   through subcutaneous tissues and cephalic vein was identified and taken   laterally with the deltoid.  The pectoralis major was taken medially.  The   upper 1 cm of the pectoralis major was released from its attachment on   the humerus.  The clavipectoral fascia was incised just lateral to the   conjoined tendon.  This incision was carried up to but not into the   coracoacromial ligament.  Digital palpation was used to prove   integrity of the axillary nerve which was protected throughout the   procedure.  Musculocutaneous nerve was not palpated in the operative   field.  Conjoined tendon was then retracted gently medially and the   deltoid laterally.  Anterior circumflex humeral vessels were clamped and  coagulated.  The soft tissues overlying the biceps was incised and this   incision was carried across the transverse humeral ligament to the base   of the coracoid.  The biceps was tenodesed to the soft tissue just above   pectoralis major and the remaining portion of the biceps superiorly was   excised.  An osteotomy was performed at the lesser tuberosity and the   subscapularis was freed from the underlying capsule.  Capsule was then   released all the way down to the 6 o'clock position of the humeral head.   The humeral head was  then delivered with simultaneous adduction,   extension and external rotation.  All humeral osteophytes were removed   and the anatomic neck of the humerus was marked and cut free hand at   approximately 25 degrees retroversion within about 3 mm of the cuff   reflection posteriorly.  Bone quality was noted to be very soft. The head size was estimated to be a 44 medium   offset.  At that point, the humeral head was retracted posteriorly with   a Fukuda retractor and the anterior-inferior capsule was excised.   Remaining portion of the capsule was released at the base of the   coracoid.  The remaining biceps anchor and the entire anterior-inferior   labrum was excised.  The posterior labrum was also excised but the   posterior capsule was not released.  The guidepin was placed bicortically with +0 elevated guide.  The reamer was used to ream to concentric bone with punctate bleeding.  This gave an excellent concentric surface.  The center hole was then drilled for an anchor peg glenoid followed by the three peripheral holes and none of the holes   exited the glenoid Nishi.  I then pulse irrigated these holes and dried   them with Surgicel.  The three peripheral holes were then   pressurized cemented and the anchor peg glenoid was placed and impacted   with an excellent fit.  The glenoid was a 44 component.  The proximal humerus was then again exposed taking care not to displace the glenoid.    The humerus was then sequentially reamed going from 6 to 10 by 2 mm incriments. The 10 mm reamer was found to have appropriate cortical contact.  A   box osteotome was then used and a 10-mm broach.  The broach handle was   removed and the trial head was placed.   Calcar reamer was used.The eccentric 44 x 18 head fit best.  With the trial implantation of the component, there was   approximately 50% posterior translation with immediate snap back to the   anatomic position.  The 44 x 15 trial felt a bit loose  posteriorly. With forward elevation, there was no tendency   towards posterior subluxation.   The trial was removed and the final implant was prepared on a back table.  The trial was removed and the final implant was prepared on a back table.   Small holes were drilled on both sides of the lesser tuberosity osteotomy, through which 3 #2 FiberWires were passed. The implant was then placed through the loop of all 3 #2 FiberWires and impacted with an excellent press-fit. This achieved excellent anatomic reconstruction of the proximal humerus.  The joint was then copiously irrigated with pulse lavage.  The subscapularis and   lesser tuberosity osteotomy were then repaired using the 3 #2 fiber wires previously passed.   One #1 Ethibond  was placed at the rotator interval just above   the lesser tuberosity. After repair of the lesser tuberosity, a medium   Hemovac was placed out anterolaterally and again copious irrigation was   used.   Skin was closed with 2-0 Vicryl sutures in the deep dermal layer and 4-0 Monocryl in a subcuticular  running fashion.  Sterile dressings were then applied including Steri- Strips, 4x4s, ABDs and tape.  The patient was placed in a sling and allowed to awaken from general anesthesia and taken to the recovery room in stable  condition.      POSTOPERATIVE PLAN:  Early passive range of motion will be allowed with the goal of 40 degrees external rotation and 140 degrees forward elevation.  No internal rotation at this time.  No active motion of the arm until the lesser tuberosity heals.  The patient will likely be kept in the hospital for 1-2 days and then discharged home.

## 2012-11-26 DIAGNOSIS — Z96619 Presence of unspecified artificial shoulder joint: Secondary | ICD-10-CM | POA: Diagnosis present

## 2012-11-26 LAB — BASIC METABOLIC PANEL
BUN: 7 mg/dL (ref 6–23)
Chloride: 103 mEq/L (ref 96–112)
GFR calc Af Amer: >90 mL/min (ref >90)
GFR calc non Af Amer: 85 mL/min — ABNORMAL LOW (ref >90)
Glucose, Bld: 123 mg/dL — ABNORMAL HIGH (ref 70–99)
Potassium: 3.7 mEq/L (ref 3.5–5.1)
Sodium: 138 mEq/L (ref 135–145)

## 2012-11-26 LAB — URINALYSIS, ROUTINE W REFLEX MICROSCOPIC
Glucose, UA: NEGATIVE mg/dL
Hgb urine dipstick: NEGATIVE
Nitrite: NEGATIVE
Protein, ur: NEGATIVE mg/dL
Specific Gravity, Urine: 1.007 (ref 1.005–1.030)

## 2012-11-26 LAB — CBC
HCT: 32.6 % — ABNORMAL LOW (ref 36.0–46.0)
MCHC: 32.8 g/dL (ref 30.0–36.0)
MCV: 89.3 fL (ref 78.0–100.0)
RBC: 3.65 MIL/uL — ABNORMAL LOW (ref 3.87–5.11)
RDW: 13.5 % (ref 11.5–15.5)
WBC: 11.2 10*3/uL — ABNORMAL HIGH (ref 4.0–10.5)

## 2012-11-26 LAB — URINE MICROSCOPIC-ADD ON

## 2012-11-26 NOTE — Evaluation (Signed)
Occupational Therapy Evaluation and Discharge Patient Details Name: Sally [name] MRN: [id number] DOB: 05/11/1945 Today's Date: 11/26/2012 Time: 1478-2956 OT Time Calculation (min): 60 min  OT Assessment / Plan / Recommendation History of present illness left TOTAL SHOULDER ARTHROPLASTY   Clinical Impression   This 67 yo female s/p above presents to acute OT with all education completed. Will D/C from acute OT.    OT Assessment  Progress rehab of shoulder as ordered by MD at follow-up appointment    Follow Up Recommendations  No OT follow up       Equipment Recommendations  None recommended by OT          Precautions / Restrictions Precautions Precautions: Shoulder Shoulder Interventions: Shoulder sling/immobilizer;At all times;Off for dressing/bathing/exercises Required Braces or Orthoses: Sling Restrictions Weight Bearing Restrictions: Yes LUE Weight Bearing: Non weight bearing   Pertinent Vitals/Pain 5/10 LUE for shoulder flexion supine; repositioned and ice applied    ADL  Equipment Used:  (sling) Transfers/Ambulation Related to ADLs: S for all ADL Comments: Pt is S for UBB, max A for UBD--husband can A prn.      Acute Rehab OT Goals Patient Stated Goal: maybe home today, if not tomorrow  Visit Information  Last OT Received On: 11/26/12 Assistance Needed: +1 History of Present Illness: left TOTAL SHOULDER ARTHROPLASTY       Prior Functioning     Home Living Family/patient expects to be discharged to:: Private residence Living Arrangements: Spouse/significant other Available Help at Discharge: Family;Available 24 hours/day Type of Home: House Home Equipment: Hand held shower head Prior Function Level of Independence: Independent Communication Communication: No difficulties Dominant Hand: Right         Vision/Perception Vision - History Patient Visual Report: No change from baseline   Cognition  Cognition Arousal/Alertness:  Awake/alert Behavior During Therapy: WFL for tasks assessed/performed Overall Cognitive Status: Within Functional Limits for tasks assessed    Extremity/Trunk Assessment Upper Extremity Assessment Upper Extremity Assessment: LUE deficits/detail LUE Deficits / Details: Shoulder surgery yesterday, rest WNL LUE Coordination: decreased gross motor     Mobility Bed Mobility Bed Mobility: Rolling Right;Right Sidelying to Sit;Sit to Sidelying Right Rolling Right: 5: Supervision Right Sidelying to Sit: 5: Supervision;HOB flat Sit to Sidelying Right: 5: Supervision;HOB flat Details for Bed Mobility Assistance: VCs for sequence Transfers Transfers: Sit to Stand;Stand to Sit Sit to Stand: 5: Supervision Stand to Sit: 5: Supervision     Exercise Other Exercises Other Exercises: Pt educated on S/PROM for shoulder flexion supine--pt only able to get to about 20 degrees with A of husband to hold the weight of her arm (very bruised and swollen upper arm medially)--pt is aware her goal is 140 degrees by the time she goes back to see Dr. Ave Filter. Also educated on S/PROM shoulder external rotation supine with reacher as her dowel stick. PT able to get 40 degrees and is aware to not go beyond this. 10 reps 3x/day today and then starting tomorrow 10 reps 5x/day Donning/doffing shirt without moving shoulder: Caregiver independent with task Method for sponge bathing under operated UE: Modified independent Donning/doffing sling/immobilizer: Caregiver independent with task Correct positioning of sling/immobilizer: Independent Pendulum exercises (written home exercise program):  (N/A) ROM for elbow, wrist and digits of operated UE: Independent Sling wearing schedule (on at all times/off for ADL's): Independent Proper positioning of operated UE when showering: Independent Dressing change:  (N/A) Positioning of UE while sleeping: Caregiver independent with task      End of Session OT -  End of  Session Activity Tolerance: Patient limited by pain Patient left: in bed;with call bell/phone within reach;with family/visitor present       Evette Georges [phone] 11/26/2012, 12:55 PM

## 2012-11-26 NOTE — Progress Notes (Signed)
PATIENT ID: Sally [name]   1 Day Post-Op Procedure(s) (LRB): TOTAL SHOULDER ARTHROPLASTY (Left)  Subjective: Reports minimal pain in left shoulder this am. Reports increased pain last night that subsided around 7pm and has been improved since. No other complaints or concerns.  Objective:  Filed Vitals:   11/26/12 0535  BP: 119/63  Pulse: 86  Temp: 98.1 F (36.7 C)  Resp: 16     L shoulder dressing d/c/i, minimal swelling Wiggles fingers, distally NVI Adequate firing of deltoid  Labs:   Recent Labs  11/26/12 0630  HGB 10.7*   Recent Labs  11/26/12 0630  WBC 11.2*  RBC 3.65*  HCT 32.6*  PLT 306   Recent Labs  11/26/12 0630  NA 138  K 3.7  CL 103  CO2 24  BUN 7  CREATININE 0.78  GLUCOSE 123*  CALCIUM 8.4    Assessment and Plan: 1 day s/p left TSA Pain now well controlled Can d/c home today after OT, went over exercises this am as well Outpatient mgmt be patient's pain mgmt physician. Discussed this with her today and she understands. She does not need pain rx prior to d/c  VTE proph: ASA 325mg  BID, SCDs

## 2012-11-26 NOTE — Progress Notes (Signed)
Spoke with Jiles Harold, PA @ [phone] about Ms. Sally's concern for being d/c to home today.  [name] felt that her pain was too severe to be discharged today.  Danielle said it was ok for pt to stay another night and that Jason Coop, PA would round on the patient tomorrow.  Nsg to continue to monitor.

## 2012-11-27 NOTE — Progress Notes (Signed)
Sally [name] to be D/C'd Home per MD order.  Discussed with the patient and all questions fully answered.    Medication List         BIOTIN 5000 PO  Take 5,000 Units by mouth daily.     BUFFERIN PO  Take 2 tablets by mouth daily.     OVER THE COUNTER MEDICATION  Take 2 tablets by mouth daily. Tumeric     OVER THE COUNTER MEDICATION  Take 125 mg by mouth daily. DHEA     OxyCODONE 20 mg T12a 12 hr tablet  Commonly known as:  OXYCONTIN  Take 20 mg by mouth every 12 (twelve) hours.     oxyCODONE-acetaminophen 10-325 MG per tablet  Commonly known as:  PERCOCET  Take 1.5 tablets by mouth daily.     telmisartan 40 MG tablet  Commonly known as:  MICARDIS  Take 40 mg by mouth daily.     TENUATE PO  Take 37.5 mg by mouth 3 (three) times daily.     thyroid 60 MG tablet  Commonly known as:  ARMOUR  Take 120 mg by mouth daily before breakfast.     VITAMIN C PO  Take 1 tablet by mouth daily.     Vitamin D-3 5000 UNITS Tabs  Take 1 capsule by mouth daily.     VITAMIN-B COMPLEX PO  Take 1 tablet by mouth daily.        VVS, Skin clean, dry and intact without evidence of skin break down, no evidence of skin tears noted. IV catheter discontinued intact. Site without signs and symptoms of complications. Dressing and pressure applied.  An After Visit Summary was printed and given to the patient. Follow up appointments , new prescriptions and medication administration times given. Orthopedic care discussed and teach back. Patient escorted via WC, and D/C home via private auto.  Cindra Eves, RN 11/27/2012 12:46 PM

## 2012-11-27 NOTE — Progress Notes (Signed)
2 Days S/P L TSA per Ave Filter, doing well. Minimal pain, well controlled, has been educated on home exercises. Slept well. Ready to proceed home.  BP 112/52  Pulse 84  Temp(Src) 99.4 F (37.4 C) (Oral)  Resp 18  SpO2 100%  Pt laying comfortably in hospital bed, L sling in place, bandage C/D/I. 2+ distal radial pulses equal BIL. NVI. Full ROM L wrist and fingers.  2 Days S/P L TSA per Ave Filter  -Px well controlled   -Pt in px mngmt, no Rx given  -D/C home today  -ASA 325 BID

## 2012-11-30 ENCOUNTER — Encounter (HOSPITAL_COMMUNITY): Payer: Self-pay | Admitting: Orthopedic Surgery

## 2012-12-02 DIAGNOSIS — Z79899 Other long term (current) drug therapy: Secondary | ICD-10-CM | POA: Diagnosis not present

## 2012-12-02 DIAGNOSIS — M47817 Spondylosis without myelopathy or radiculopathy, lumbosacral region: Secondary | ICD-10-CM | POA: Diagnosis not present

## 2012-12-02 DIAGNOSIS — G894 Chronic pain syndrome: Secondary | ICD-10-CM | POA: Diagnosis not present

## 2012-12-02 DIAGNOSIS — M25519 Pain in unspecified shoulder: Secondary | ICD-10-CM | POA: Diagnosis not present

## 2012-12-06 NOTE — Discharge Summary (Signed)
Patient ID: ZSOFIA PROUT MRN: [id number] DOB/AGE: [birth year] 67 y.o.  Admit date: 11/25/2012 Discharge date: 12/06/2012  Admission Diagnoses:  Principal Problem:   Arthritis of shoulder region, left   Discharge Diagnoses:  Same  Past Medical History  Diagnosis Date  . Fibromyalgia   . Chronic pain syndrome   . Arthritis   . Hx of colonic polyps   . Hypertension   . Obesity   . Chronic UTI   . Allergy   . Hypothyroidism     Surgeries: Procedure(s): TOTAL SHOULDER ARTHROPLASTY on 11/25/2012   Consultants:    Discharged Condition: Improved  Hospital Course: Sally [name] is an 68 y.o. female who was admitted 11/25/2012 for operative treatment ofArthritis of shoulder region, left. Patient has severe unremitting pain that affects sleep, daily activities, and work/hobbies. After pre-op clearance the patient was taken to the operating room on 11/25/2012 and underwent  Procedure(s): TOTAL SHOULDER ARTHROPLASTY.    Patient was given perioperative antibiotics:  Anti-infectives   Start     Dose/Rate Route Frequency Ordered Stop   11/25/12 1600  ceFAZolin (ANCEF) IVPB 2 g/50 mL premix     2 g 100 mL/hr over 30 Minutes Intravenous Every 6 hours 11/25/12 1525 11/26/12 0436   11/25/12 0600  ceFAZolin (ANCEF) IVPB 2 g/50 mL premix     2 g 100 mL/hr over 30 Minutes Intravenous On call to O.R. 11/24/12 1400 11/25/12 1040       Patient was given sequential compression devices, early ambulation, and ASA 325mg  BID to prevent DVT.  Patient benefited maximally from hospital stay and there were no complications. She was kept for two nights postoperatively for pain control.     Recent vital signs: No data found.    Recent laboratory studies: No results found for this basename: WBC, HGB, HCT, PLT, NA, K, CL, CO2, BUN, CREATININE, GLUCOSE, PT, INR, CALCIUM, 2,  in the last 72 hours   Discharge Medications:     Medication List         BIOTIN 5000 PO  Take 5,000 Units by mouth daily.     BUFFERIN PO  Take 2 tablets by mouth daily.     OVER THE COUNTER MEDICATION  Take 2 tablets by mouth daily. Tumeric     OVER THE COUNTER MEDICATION  Take 125 mg by mouth daily. DHEA     OxyCODONE 20 mg T12a 12 hr tablet  Commonly known as:  OXYCONTIN  Take 20 mg by mouth every 12 (twelve) hours.     oxyCODONE-acetaminophen 10-325 MG per tablet  Commonly known as:  PERCOCET  Take 1.5 tablets by mouth daily.     telmisartan 40 MG tablet  Commonly known as:  MICARDIS  Take 40 mg by mouth daily.     TENUATE PO  Take 37.5 mg by mouth 3 (three) times daily.     thyroid 60 MG tablet  Commonly known as:  ARMOUR  Take 120 mg by mouth daily before breakfast.     VITAMIN C PO  Take 1 tablet by mouth daily.     Vitamin D-3 5000 UNITS Tabs  Take 1 capsule by mouth daily.     VITAMIN-B COMPLEX PO  Take 1 tablet by mouth daily.        Diagnostic Studies: Dg Chest 2 View  11/19/2012   *RADIOLOGY REPORT*  Clinical Data: Osteoarthritis of the left shoulder.  Preoperative respiratory exam.  CHEST - 2 VIEW  Comparison: 08/04/2011  Findings: The heart  size and pulmonary vascularity are normal and the lungs are clear.  No acute osseous abnormality.  IMPRESSION: No acute disease.   Original Report Authenticated By: Francene Boyers, M.D.   Dg Shoulder 1v Left  11/25/2012   CLINICAL DATA:  Post total shoulder arthroplasty  EXAM: LEFT SHOULDER - 1 VIEW  COMPARISON:  Portable exam 1335 hr compared to left humeral radiographs of 05/17/2011  FINDINGS: Left humeral prosthesis identified in expected position.  No acute fracture, dislocation or bone destruction identified on single AP view; no orthogonal view for correlation.  Diffuse osseous demineralization noted.  IMPRESSION: Left humeral prosthesis without acute complication.   Electronically Signed   By: Ulyses Southward M.D.   On: 11/25/2012 13:51    Disposition: 01-Home or Self Care      Discharge Orders   Future Orders Complete By Expires    Call MD / Call 911  As directed    Comments:     If you experience chest pain or shortness of breath, CALL 911 and be transported to the hospital emergency room.  If you develope a fever above 101 F, pus (white drainage) or increased drainage or redness at the wound, or calf pain, call your surgeon's office.   Constipation Prevention  As directed    Comments:     Drink plenty of fluids.  Prune juice may be helpful.  You may use a stool softener, such as Colace (over the counter) 100 mg twice a day.  Use MiraLax (over the counter) for constipation as needed.   Diet - low sodium heart healthy  As directed    Driving restrictions  As directed    Comments:     No driving until cleared by physician.   Increase activity slowly as tolerated  As directed       Follow-up Information   Follow up with Mable Paris, MD. Schedule an appointment as soon as possible for a visit in 2 weeks.   Specialty:  Orthopedic Surgery   Contact information:   [address] [place] [address] [phone]        Signed: Jiles Harold 12/06/2012, 2:31 PM

## 2012-12-10 DIAGNOSIS — M19019 Primary osteoarthritis, unspecified shoulder: Secondary | ICD-10-CM | POA: Diagnosis not present

## 2012-12-30 DIAGNOSIS — M159 Polyosteoarthritis, unspecified: Secondary | ICD-10-CM | POA: Diagnosis not present

## 2012-12-30 DIAGNOSIS — M25519 Pain in unspecified shoulder: Secondary | ICD-10-CM | POA: Diagnosis not present

## 2012-12-30 DIAGNOSIS — Z79899 Other long term (current) drug therapy: Secondary | ICD-10-CM | POA: Diagnosis not present

## 2012-12-30 DIAGNOSIS — G894 Chronic pain syndrome: Secondary | ICD-10-CM | POA: Diagnosis not present

## 2013-01-12 DIAGNOSIS — Z79899 Other long term (current) drug therapy: Secondary | ICD-10-CM | POA: Diagnosis not present

## 2013-01-12 DIAGNOSIS — M19019 Primary osteoarthritis, unspecified shoulder: Secondary | ICD-10-CM | POA: Diagnosis not present

## 2013-01-12 DIAGNOSIS — R635 Abnormal weight gain: Secondary | ICD-10-CM | POA: Diagnosis not present

## 2013-01-18 ENCOUNTER — Ambulatory Visit: Payer: Medicare Other | Admitting: Physical Therapy

## 2013-01-18 DIAGNOSIS — M25619 Stiffness of unspecified shoulder, not elsewhere classified: Secondary | ICD-10-CM

## 2013-01-18 DIAGNOSIS — M6281 Muscle weakness (generalized): Secondary | ICD-10-CM

## 2013-01-18 DIAGNOSIS — M25519 Pain in unspecified shoulder: Secondary | ICD-10-CM

## 2013-01-19 ENCOUNTER — Encounter: Payer: Federal, State, Local not specified - PPO | Admitting: Physical Therapy

## 2013-01-19 DIAGNOSIS — M25519 Pain in unspecified shoulder: Secondary | ICD-10-CM

## 2013-01-19 DIAGNOSIS — M25619 Stiffness of unspecified shoulder, not elsewhere classified: Secondary | ICD-10-CM

## 2013-01-19 DIAGNOSIS — M6281 Muscle weakness (generalized): Secondary | ICD-10-CM

## 2013-01-21 ENCOUNTER — Encounter: Payer: Federal, State, Local not specified - PPO | Admitting: Physical Therapy

## 2013-01-21 DIAGNOSIS — M6281 Muscle weakness (generalized): Secondary | ICD-10-CM

## 2013-01-21 DIAGNOSIS — M25519 Pain in unspecified shoulder: Secondary | ICD-10-CM

## 2013-01-21 DIAGNOSIS — M25619 Stiffness of unspecified shoulder, not elsewhere classified: Secondary | ICD-10-CM

## 2013-01-24 DIAGNOSIS — N390 Urinary tract infection, site not specified: Secondary | ICD-10-CM | POA: Diagnosis not present

## 2013-01-25 ENCOUNTER — Encounter: Payer: Federal, State, Local not specified - PPO | Admitting: Physical Therapy

## 2013-01-25 DIAGNOSIS — M6281 Muscle weakness (generalized): Secondary | ICD-10-CM

## 2013-01-25 DIAGNOSIS — M25619 Stiffness of unspecified shoulder, not elsewhere classified: Secondary | ICD-10-CM

## 2013-01-25 DIAGNOSIS — M25519 Pain in unspecified shoulder: Secondary | ICD-10-CM

## 2013-01-31 ENCOUNTER — Encounter: Payer: Federal, State, Local not specified - PPO | Admitting: Physical Therapy

## 2013-01-31 ENCOUNTER — Encounter: Payer: Medicare Other | Admitting: Physical Therapy

## 2013-01-31 DIAGNOSIS — M25519 Pain in unspecified shoulder: Secondary | ICD-10-CM

## 2013-01-31 DIAGNOSIS — M159 Polyosteoarthritis, unspecified: Secondary | ICD-10-CM | POA: Diagnosis not present

## 2013-01-31 DIAGNOSIS — M6281 Muscle weakness (generalized): Secondary | ICD-10-CM

## 2013-01-31 DIAGNOSIS — Z79899 Other long term (current) drug therapy: Secondary | ICD-10-CM | POA: Diagnosis not present

## 2013-01-31 DIAGNOSIS — M25619 Stiffness of unspecified shoulder, not elsewhere classified: Secondary | ICD-10-CM

## 2013-01-31 DIAGNOSIS — M47817 Spondylosis without myelopathy or radiculopathy, lumbosacral region: Secondary | ICD-10-CM | POA: Diagnosis not present

## 2013-01-31 DIAGNOSIS — G894 Chronic pain syndrome: Secondary | ICD-10-CM | POA: Diagnosis not present

## 2013-02-02 ENCOUNTER — Encounter: Payer: Medicare Other | Admitting: Physical Therapy

## 2013-02-03 ENCOUNTER — Encounter (INDEPENDENT_AMBULATORY_CARE_PROVIDER_SITE_OTHER): Payer: Medicare Other

## 2013-02-03 DIAGNOSIS — M25519 Pain in unspecified shoulder: Secondary | ICD-10-CM

## 2013-02-03 DIAGNOSIS — M25619 Stiffness of unspecified shoulder, not elsewhere classified: Secondary | ICD-10-CM

## 2013-02-03 DIAGNOSIS — M6281 Muscle weakness (generalized): Secondary | ICD-10-CM

## 2013-02-04 ENCOUNTER — Encounter: Payer: Medicare Other | Admitting: Physical Therapy

## 2013-02-07 ENCOUNTER — Encounter: Payer: Medicare Other | Admitting: Physical Therapy

## 2013-02-09 ENCOUNTER — Encounter: Payer: Medicare Other | Admitting: Physical Therapy

## 2013-02-21 ENCOUNTER — Encounter: Payer: Federal, State, Local not specified - PPO | Admitting: Physical Therapy

## 2013-02-22 ENCOUNTER — Encounter (INDEPENDENT_AMBULATORY_CARE_PROVIDER_SITE_OTHER): Payer: Medicare Other | Admitting: Physical Therapy

## 2013-02-22 DIAGNOSIS — M25519 Pain in unspecified shoulder: Secondary | ICD-10-CM

## 2013-02-22 DIAGNOSIS — M6281 Muscle weakness (generalized): Secondary | ICD-10-CM

## 2013-02-22 DIAGNOSIS — M25619 Stiffness of unspecified shoulder, not elsewhere classified: Secondary | ICD-10-CM

## 2013-02-28 ENCOUNTER — Encounter (INDEPENDENT_AMBULATORY_CARE_PROVIDER_SITE_OTHER): Payer: Medicare Other | Admitting: Physical Therapy

## 2013-02-28 DIAGNOSIS — M6281 Muscle weakness (generalized): Secondary | ICD-10-CM

## 2013-02-28 DIAGNOSIS — M25519 Pain in unspecified shoulder: Secondary | ICD-10-CM

## 2013-02-28 DIAGNOSIS — M25619 Stiffness of unspecified shoulder, not elsewhere classified: Secondary | ICD-10-CM

## 2013-03-01 DIAGNOSIS — M47817 Spondylosis without myelopathy or radiculopathy, lumbosacral region: Secondary | ICD-10-CM | POA: Diagnosis not present

## 2013-03-01 DIAGNOSIS — M25519 Pain in unspecified shoulder: Secondary | ICD-10-CM | POA: Diagnosis not present

## 2013-03-01 DIAGNOSIS — Z79899 Other long term (current) drug therapy: Secondary | ICD-10-CM | POA: Diagnosis not present

## 2013-03-01 DIAGNOSIS — G894 Chronic pain syndrome: Secondary | ICD-10-CM | POA: Diagnosis not present

## 2013-03-04 ENCOUNTER — Encounter: Payer: Medicare Other | Admitting: Physical Therapy

## 2013-03-09 DIAGNOSIS — Z79899 Other long term (current) drug therapy: Secondary | ICD-10-CM | POA: Diagnosis not present

## 2013-03-09 DIAGNOSIS — R635 Abnormal weight gain: Secondary | ICD-10-CM | POA: Diagnosis not present

## 2013-03-09 DIAGNOSIS — M19019 Primary osteoarthritis, unspecified shoulder: Secondary | ICD-10-CM | POA: Diagnosis not present

## 2013-03-28 DIAGNOSIS — Z124 Encounter for screening for malignant neoplasm of cervix: Secondary | ICD-10-CM | POA: Diagnosis not present

## 2013-03-28 DIAGNOSIS — H40019 Open angle with borderline findings, low risk, unspecified eye: Secondary | ICD-10-CM | POA: Diagnosis not present

## 2013-03-29 DIAGNOSIS — M47817 Spondylosis without myelopathy or radiculopathy, lumbosacral region: Secondary | ICD-10-CM | POA: Diagnosis not present

## 2013-03-29 DIAGNOSIS — M25519 Pain in unspecified shoulder: Secondary | ICD-10-CM | POA: Diagnosis not present

## 2013-03-29 DIAGNOSIS — G894 Chronic pain syndrome: Secondary | ICD-10-CM | POA: Diagnosis not present

## 2013-03-29 DIAGNOSIS — Z79899 Other long term (current) drug therapy: Secondary | ICD-10-CM | POA: Diagnosis not present

## 2013-03-30 ENCOUNTER — Encounter (INDEPENDENT_AMBULATORY_CARE_PROVIDER_SITE_OTHER): Payer: Medicare Other | Admitting: Physical Therapy

## 2013-03-30 DIAGNOSIS — M25519 Pain in unspecified shoulder: Secondary | ICD-10-CM

## 2013-03-30 DIAGNOSIS — M6281 Muscle weakness (generalized): Secondary | ICD-10-CM

## 2013-03-30 DIAGNOSIS — M25619 Stiffness of unspecified shoulder, not elsewhere classified: Secondary | ICD-10-CM

## 2013-03-31 ENCOUNTER — Encounter (INDEPENDENT_AMBULATORY_CARE_PROVIDER_SITE_OTHER): Payer: Medicare Other | Admitting: Physical Therapy

## 2013-03-31 DIAGNOSIS — M25619 Stiffness of unspecified shoulder, not elsewhere classified: Secondary | ICD-10-CM

## 2013-03-31 DIAGNOSIS — M6281 Muscle weakness (generalized): Secondary | ICD-10-CM

## 2013-03-31 DIAGNOSIS — M25519 Pain in unspecified shoulder: Secondary | ICD-10-CM

## 2013-04-03 DIAGNOSIS — J019 Acute sinusitis, unspecified: Secondary | ICD-10-CM | POA: Diagnosis not present

## 2013-04-03 DIAGNOSIS — H6690 Otitis media, unspecified, unspecified ear: Secondary | ICD-10-CM | POA: Diagnosis not present

## 2013-04-04 ENCOUNTER — Encounter: Payer: Medicare Other | Admitting: Physical Therapy

## 2013-04-06 ENCOUNTER — Encounter (INDEPENDENT_AMBULATORY_CARE_PROVIDER_SITE_OTHER): Payer: Medicare Other | Admitting: Physical Therapy

## 2013-04-06 DIAGNOSIS — M25619 Stiffness of unspecified shoulder, not elsewhere classified: Secondary | ICD-10-CM

## 2013-04-06 DIAGNOSIS — M25519 Pain in unspecified shoulder: Secondary | ICD-10-CM

## 2013-04-06 DIAGNOSIS — M6281 Muscle weakness (generalized): Secondary | ICD-10-CM

## 2013-04-12 ENCOUNTER — Encounter: Payer: Self-pay | Admitting: Obstetrics & Gynecology

## 2013-04-12 ENCOUNTER — Ambulatory Visit (INDEPENDENT_AMBULATORY_CARE_PROVIDER_SITE_OTHER): Payer: Medicare Other | Admitting: Obstetrics & Gynecology

## 2013-04-12 VITALS — BP 141/83 | HR 89 | Resp 16 | Ht 67.0 in

## 2013-04-12 DIAGNOSIS — N952 Postmenopausal atrophic vaginitis: Secondary | ICD-10-CM | POA: Diagnosis not present

## 2013-04-12 MED ORDER — ESTROGENS, CONJUGATED 0.625 MG/GM VA CREA: TOPICAL_CREAM | VAGINAL | Status: DC

## 2013-04-12 NOTE — Progress Notes (Signed)
   Subjective:    Patient ID: Sally [name], female    DOB: [birth year], 68 y.o.   MRN: [id number]  HPI  68 yo MW P6 (47-28) here because of "recurrent BV". She says that for the last 2 years she has had vaginal/vulvar itching in 1 spot, scratches so much that she has actually caused herself a "sore/scratch". Worse at night after voiding.She has tried with relief [place] moisturer. She denies dyspareunia. She does not use any estrogen products. She started using FDS vaginal spray about 2 years ago. She does not douche. She reports occasional incontinence.  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 There was a very sore area right at the posterior fourchette that prevented me from inserting a speculum. Nothing to suggest HSV or malignancy or lichen.      Assessment & Plan:  Vulvar atrophy- vaginal estrogen every night for a week, then QO week. Use A&D ointment every other day. RTC 1 month She declines a flu vaccine

## 2013-04-13 ENCOUNTER — Encounter (INDEPENDENT_AMBULATORY_CARE_PROVIDER_SITE_OTHER): Payer: Federal, State, Local not specified - PPO | Admitting: Physical Therapy

## 2013-04-13 DIAGNOSIS — M25619 Stiffness of unspecified shoulder, not elsewhere classified: Secondary | ICD-10-CM

## 2013-04-13 DIAGNOSIS — M25519 Pain in unspecified shoulder: Secondary | ICD-10-CM

## 2013-04-13 DIAGNOSIS — M6281 Muscle weakness (generalized): Secondary | ICD-10-CM

## 2013-04-14 ENCOUNTER — Encounter: Payer: Medicare Other | Admitting: Physical Therapy

## 2013-04-15 ENCOUNTER — Encounter (INDEPENDENT_AMBULATORY_CARE_PROVIDER_SITE_OTHER): Payer: Federal, State, Local not specified - PPO | Admitting: Physical Therapy

## 2013-04-15 DIAGNOSIS — M6281 Muscle weakness (generalized): Secondary | ICD-10-CM

## 2013-04-15 DIAGNOSIS — M25519 Pain in unspecified shoulder: Secondary | ICD-10-CM

## 2013-04-15 DIAGNOSIS — M25619 Stiffness of unspecified shoulder, not elsewhere classified: Secondary | ICD-10-CM

## 2013-04-18 ENCOUNTER — Encounter: Payer: Medicare Other | Admitting: Physical Therapy

## 2013-04-20 DIAGNOSIS — F329 Major depressive disorder, single episode, unspecified: Secondary | ICD-10-CM | POA: Diagnosis not present

## 2013-04-20 DIAGNOSIS — R5383 Other fatigue: Secondary | ICD-10-CM | POA: Diagnosis not present

## 2013-04-20 DIAGNOSIS — K130 Diseases of lips: Secondary | ICD-10-CM | POA: Diagnosis not present

## 2013-04-20 DIAGNOSIS — R5381 Other malaise: Secondary | ICD-10-CM | POA: Diagnosis not present

## 2013-04-27 ENCOUNTER — Encounter (INDEPENDENT_AMBULATORY_CARE_PROVIDER_SITE_OTHER): Payer: Federal, State, Local not specified - PPO | Admitting: Physical Therapy

## 2013-04-27 DIAGNOSIS — M25519 Pain in unspecified shoulder: Secondary | ICD-10-CM

## 2013-04-27 DIAGNOSIS — M6281 Muscle weakness (generalized): Secondary | ICD-10-CM

## 2013-04-27 DIAGNOSIS — G894 Chronic pain syndrome: Secondary | ICD-10-CM | POA: Diagnosis not present

## 2013-04-27 DIAGNOSIS — M25619 Stiffness of unspecified shoulder, not elsewhere classified: Secondary | ICD-10-CM

## 2013-04-27 DIAGNOSIS — Z79899 Other long term (current) drug therapy: Secondary | ICD-10-CM | POA: Diagnosis not present

## 2013-04-27 DIAGNOSIS — M159 Polyosteoarthritis, unspecified: Secondary | ICD-10-CM | POA: Diagnosis not present

## 2013-04-28 ENCOUNTER — Encounter: Payer: Medicare Other | Admitting: Physical Therapy

## 2013-05-02 ENCOUNTER — Encounter: Payer: Medicare Other | Admitting: Physical Therapy

## 2013-05-03 ENCOUNTER — Ambulatory Visit: Payer: Medicare Other | Attending: Family Medicine | Admitting: Physical Therapy

## 2013-05-03 DIAGNOSIS — H8110 Benign paroxysmal vertigo, unspecified ear: Secondary | ICD-10-CM | POA: Diagnosis not present

## 2013-05-05 ENCOUNTER — Encounter: Payer: Medicare Other | Admitting: Physical Therapy

## 2013-05-10 ENCOUNTER — Ambulatory Visit: Payer: Medicare Other | Admitting: Rehabilitative and Restorative Service Providers"

## 2013-05-12 DIAGNOSIS — H93299 Other abnormal auditory perceptions, unspecified ear: Secondary | ICD-10-CM | POA: Diagnosis not present

## 2013-05-12 DIAGNOSIS — M542 Cervicalgia: Secondary | ICD-10-CM | POA: Diagnosis not present

## 2013-05-17 DIAGNOSIS — R011 Cardiac murmur, unspecified: Secondary | ICD-10-CM | POA: Diagnosis not present

## 2013-05-17 DIAGNOSIS — R635 Abnormal weight gain: Secondary | ICD-10-CM | POA: Diagnosis not present

## 2013-05-17 DIAGNOSIS — H6980 Other specified disorders of Eustachian tube, unspecified ear: Secondary | ICD-10-CM | POA: Diagnosis not present

## 2013-05-17 DIAGNOSIS — Z79899 Other long term (current) drug therapy: Secondary | ICD-10-CM | POA: Diagnosis not present

## 2013-05-19 ENCOUNTER — Ambulatory Visit: Payer: Medicare Other | Admitting: Obstetrics & Gynecology

## 2013-05-25 DIAGNOSIS — Z79899 Other long term (current) drug therapy: Secondary | ICD-10-CM | POA: Diagnosis not present

## 2013-05-25 DIAGNOSIS — G894 Chronic pain syndrome: Secondary | ICD-10-CM | POA: Diagnosis not present

## 2013-05-25 DIAGNOSIS — M25519 Pain in unspecified shoulder: Secondary | ICD-10-CM | POA: Diagnosis not present

## 2013-05-25 DIAGNOSIS — M159 Polyosteoarthritis, unspecified: Secondary | ICD-10-CM | POA: Diagnosis not present

## 2013-05-30 DIAGNOSIS — IMO0001 Reserved for inherently not codable concepts without codable children: Secondary | ICD-10-CM | POA: Diagnosis not present

## 2013-05-30 DIAGNOSIS — M159 Polyosteoarthritis, unspecified: Secondary | ICD-10-CM | POA: Diagnosis not present

## 2013-06-06 DIAGNOSIS — D690 Allergic purpura: Secondary | ICD-10-CM | POA: Diagnosis not present

## 2013-06-06 DIAGNOSIS — L918 Other hypertrophic disorders of the skin: Secondary | ICD-10-CM | POA: Diagnosis not present

## 2013-06-06 DIAGNOSIS — B078 Other viral warts: Secondary | ICD-10-CM | POA: Diagnosis not present

## 2013-06-06 DIAGNOSIS — E559 Vitamin D deficiency, unspecified: Secondary | ICD-10-CM | POA: Diagnosis not present

## 2013-06-06 DIAGNOSIS — L908 Other atrophic disorders of skin: Secondary | ICD-10-CM | POA: Diagnosis not present

## 2013-06-06 DIAGNOSIS — E039 Hypothyroidism, unspecified: Secondary | ICD-10-CM | POA: Diagnosis not present

## 2013-06-08 DIAGNOSIS — E039 Hypothyroidism, unspecified: Secondary | ICD-10-CM | POA: Diagnosis not present

## 2013-06-15 DIAGNOSIS — M19019 Primary osteoarthritis, unspecified shoulder: Secondary | ICD-10-CM | POA: Diagnosis not present

## 2013-06-17 DIAGNOSIS — R635 Abnormal weight gain: Secondary | ICD-10-CM | POA: Diagnosis not present

## 2013-06-17 DIAGNOSIS — Z79899 Other long term (current) drug therapy: Secondary | ICD-10-CM | POA: Diagnosis not present

## 2013-06-29 DIAGNOSIS — I10 Essential (primary) hypertension: Secondary | ICD-10-CM | POA: Diagnosis not present

## 2013-06-29 DIAGNOSIS — L919 Hypertrophic disorder of the skin, unspecified: Secondary | ICD-10-CM | POA: Diagnosis not present

## 2013-06-29 DIAGNOSIS — L909 Atrophic disorder of skin, unspecified: Secondary | ICD-10-CM | POA: Diagnosis not present

## 2013-07-18 DIAGNOSIS — R5381 Other malaise: Secondary | ICD-10-CM | POA: Diagnosis not present

## 2013-07-18 DIAGNOSIS — R5383 Other fatigue: Secondary | ICD-10-CM | POA: Diagnosis not present

## 2013-07-18 DIAGNOSIS — M47817 Spondylosis without myelopathy or radiculopathy, lumbosacral region: Secondary | ICD-10-CM | POA: Diagnosis not present

## 2013-07-18 DIAGNOSIS — M1990 Unspecified osteoarthritis, unspecified site: Secondary | ICD-10-CM | POA: Diagnosis not present

## 2013-07-18 DIAGNOSIS — M25519 Pain in unspecified shoulder: Secondary | ICD-10-CM | POA: Diagnosis not present

## 2013-07-18 DIAGNOSIS — E559 Vitamin D deficiency, unspecified: Secondary | ICD-10-CM | POA: Diagnosis not present

## 2013-07-18 DIAGNOSIS — G894 Chronic pain syndrome: Secondary | ICD-10-CM | POA: Diagnosis not present

## 2013-07-18 DIAGNOSIS — Z79899 Other long term (current) drug therapy: Secondary | ICD-10-CM | POA: Diagnosis not present

## 2013-07-18 DIAGNOSIS — E782 Mixed hyperlipidemia: Secondary | ICD-10-CM | POA: Diagnosis not present

## 2013-08-09 ENCOUNTER — Ambulatory Visit (INDEPENDENT_AMBULATORY_CARE_PROVIDER_SITE_OTHER): Payer: Medicare Other | Admitting: Sports Medicine

## 2013-08-09 ENCOUNTER — Encounter: Payer: Self-pay | Admitting: Sports Medicine

## 2013-08-09 VITALS — BP 162/85 | HR 81 | Ht 66.0 in

## 2013-08-09 DIAGNOSIS — M19011 Primary osteoarthritis, right shoulder: Secondary | ICD-10-CM | POA: Insufficient documentation

## 2013-08-09 DIAGNOSIS — Z96619 Presence of unspecified artificial shoulder joint: Secondary | ICD-10-CM

## 2013-08-09 DIAGNOSIS — M19019 Primary osteoarthritis, unspecified shoulder: Secondary | ICD-10-CM | POA: Diagnosis not present

## 2013-08-09 DIAGNOSIS — M7711 Lateral epicondylitis, right elbow: Secondary | ICD-10-CM | POA: Insufficient documentation

## 2013-08-09 DIAGNOSIS — M7710 Lateral epicondylitis, unspecified elbow: Secondary | ICD-10-CM

## 2013-08-09 MED ORDER — MELOXICAM 15 MG PO TABS: ORAL_TABLET | ORAL | Status: DC

## 2013-08-09 NOTE — Assessment & Plan Note (Signed)
No response, not even temporary to glenohumeral injection by Dr. Tamera Punt. Repeat glenohumeral injection this time with ultrasound guidance. Home rehabilitation exercises, Mobic. Return in a month.

## 2013-08-09 NOTE — Assessment & Plan Note (Signed)
By Dr. Tamera Punt, doing well

## 2013-08-09 NOTE — Assessment & Plan Note (Signed)
Mobic, home rehabilitation.

## 2013-08-09 NOTE — Progress Notes (Signed)
   Subjective:    I'm seeing this patient as a consultation for:  Dr. Harrington Challenger  CC: Right shoulder pain  HPI: This is a pleasant 68 year-old female with a history of fibromyalgia and chronic pain who comes in post left total shoulder arthroplasty by Dr. Tamera Punt. She does have a history of right shoulder osteoarthritis, with pain that she localizes at the joint line. She had an unguided glenohumeral injection from an anterior approach by Dr. Tamera Punt that provided no response, not even temporary. Pain is moderate, persistent. She did rehabilitation for her left shoulder, and would like to do this herself at home.  Right elbow pain: Localized at the common extensor tendon origin, moderate, persistent, no trauma.  Past medical history, Surgical history, Family history not pertinant except as noted below, Social history, Allergies, and medications have been entered into the medical record, reviewed, and no changes needed.   Review of Systems: No headache, visual changes, nausea, vomiting, diarrhea, constipation, dizziness, abdominal pain, skin rash, fevers, chills, night sweats, weight loss, swollen lymph nodes, body aches, joint swelling, muscle aches, chest pain, shortness of breath, mood changes, visual or auditory hallucinations.   Objective:   General: Well Developed, well nourished, and in no acute distress.  Neuro/Psych: Alert and oriented x3, extra-ocular muscles intact, able to move all 4 extremities, sensation grossly intact. Skin: Warm and dry, no rashes noted.  Respiratory: Not using accessory muscles, speaking in full sentences, trachea midline.  Cardiovascular: Pulses palpable, no extremity edema. Abdomen: Does not appear distended. Right shoulder: Limited range of motion in abduction however good external rotation suggesting that this is not adhesive capsulitis. She does have significant crepitus of the shoulder is taken through a range of motion. Right Elbow: Unremarkable to  inspection. Range of motion full pronation, supination, flexion, extension. Strength is full to all of the above directions Stable to varus, valgus stress. Negative moving valgus stress test. Tender to palpation at the common extensor tendon origin. Ulnar nerve does not sublux. Negative cubital tunnel Tinel's.  Procedure: Real-time Ultrasound Guided Injection of right glenohumeral joint Device: GE Logiq E  Verbal informed consent obtained.  Time-out conducted.  Noted no overlying erythema, induration, or other signs of local infection.  Skin prepped in a sterile fashion.  Local anesthesia: Topical Ethyl chloride.  With sterile technique and under real time ultrasound guidance:  Spinal needle advanced into the glenohumeral joint, 1 cc Kenalog 40, 4 cc lidocaine injected easily. Completed without difficulty  Pain immediately resolved suggesting accurate placement of the medication.  Advised to call if fevers/chills, erythema, induration, drainage, or persistent bleeding.  Images permanently stored and available for review in the ultrasound unit.  Impression: Technically successful ultrasound guided injection.  Impression and Recommendations:   This case required medical decision making of moderate complexity.

## 2013-08-16 DIAGNOSIS — Z79899 Other long term (current) drug therapy: Secondary | ICD-10-CM | POA: Diagnosis not present

## 2013-08-16 DIAGNOSIS — G894 Chronic pain syndrome: Secondary | ICD-10-CM | POA: Diagnosis not present

## 2013-08-16 DIAGNOSIS — M47817 Spondylosis without myelopathy or radiculopathy, lumbosacral region: Secondary | ICD-10-CM | POA: Diagnosis not present

## 2013-08-16 DIAGNOSIS — M1990 Unspecified osteoarthritis, unspecified site: Secondary | ICD-10-CM | POA: Diagnosis not present

## 2013-08-30 DIAGNOSIS — G894 Chronic pain syndrome: Secondary | ICD-10-CM | POA: Diagnosis not present

## 2013-08-30 DIAGNOSIS — Z79899 Other long term (current) drug therapy: Secondary | ICD-10-CM | POA: Diagnosis not present

## 2013-08-30 DIAGNOSIS — M1990 Unspecified osteoarthritis, unspecified site: Secondary | ICD-10-CM | POA: Diagnosis not present

## 2013-09-08 DIAGNOSIS — E559 Vitamin D deficiency, unspecified: Secondary | ICD-10-CM | POA: Diagnosis not present

## 2013-09-08 DIAGNOSIS — R7309 Other abnormal glucose: Secondary | ICD-10-CM | POA: Diagnosis not present

## 2013-09-08 DIAGNOSIS — R5381 Other malaise: Secondary | ICD-10-CM | POA: Diagnosis not present

## 2013-09-08 DIAGNOSIS — E039 Hypothyroidism, unspecified: Secondary | ICD-10-CM | POA: Diagnosis not present

## 2013-09-08 DIAGNOSIS — R5383 Other fatigue: Secondary | ICD-10-CM | POA: Diagnosis not present

## 2013-09-20 DIAGNOSIS — M47817 Spondylosis without myelopathy or radiculopathy, lumbosacral region: Secondary | ICD-10-CM | POA: Diagnosis not present

## 2013-09-20 DIAGNOSIS — Z79899 Other long term (current) drug therapy: Secondary | ICD-10-CM | POA: Diagnosis not present

## 2013-09-20 DIAGNOSIS — M1990 Unspecified osteoarthritis, unspecified site: Secondary | ICD-10-CM | POA: Diagnosis not present

## 2013-09-20 DIAGNOSIS — G894 Chronic pain syndrome: Secondary | ICD-10-CM | POA: Diagnosis not present

## 2013-10-14 DIAGNOSIS — Z79899 Other long term (current) drug therapy: Secondary | ICD-10-CM | POA: Diagnosis not present

## 2013-10-14 DIAGNOSIS — R635 Abnormal weight gain: Secondary | ICD-10-CM | POA: Diagnosis not present

## 2013-10-14 DIAGNOSIS — G894 Chronic pain syndrome: Secondary | ICD-10-CM | POA: Diagnosis not present

## 2013-10-14 DIAGNOSIS — M47817 Spondylosis without myelopathy or radiculopathy, lumbosacral region: Secondary | ICD-10-CM | POA: Diagnosis not present

## 2013-10-14 DIAGNOSIS — M25519 Pain in unspecified shoulder: Secondary | ICD-10-CM | POA: Diagnosis not present

## 2013-10-18 ENCOUNTER — Ambulatory Visit: Payer: Medicare Other | Admitting: Family Medicine

## 2013-10-27 ENCOUNTER — Encounter: Payer: Self-pay | Admitting: Family Medicine

## 2013-10-27 ENCOUNTER — Telehealth: Payer: Self-pay | Admitting: Family Medicine

## 2013-10-27 ENCOUNTER — Ambulatory Visit (INDEPENDENT_AMBULATORY_CARE_PROVIDER_SITE_OTHER): Payer: Medicare Other | Admitting: Family Medicine

## 2013-10-27 VITALS — BP 148/78 | HR 93 | Ht 67.0 in | Wt 240.0 lb

## 2013-10-27 DIAGNOSIS — L80 Vitiligo: Secondary | ICD-10-CM

## 2013-10-27 DIAGNOSIS — L821 Other seborrheic keratosis: Secondary | ICD-10-CM

## 2013-10-27 DIAGNOSIS — I10 Essential (primary) hypertension: Secondary | ICD-10-CM | POA: Diagnosis not present

## 2013-10-27 DIAGNOSIS — L814 Other melanin hyperpigmentation: Secondary | ICD-10-CM

## 2013-10-27 DIAGNOSIS — R635 Abnormal weight gain: Secondary | ICD-10-CM | POA: Diagnosis not present

## 2013-10-27 DIAGNOSIS — L819 Disorder of pigmentation, unspecified: Secondary | ICD-10-CM | POA: Diagnosis not present

## 2013-10-27 NOTE — Patient Instructions (Signed)
Phentermine; Topiramate extended-release capsules What is this medicine? Phentermine; topiramate (FEN ter meen; toe PYRE a mate) is a combination of two medicines used with a reduced calorie diet and exercise to help you lose weight. This medicine is only available through certified pharmacies enrolled in a special program. Your healthcare professional will tell you where you can get your medicine. If you have additional questions, you can visit the manufacture's website at [email or web address] or contact them by phone at [phone]. This medicine may be used for other purposes; ask your health care provider or pharmacist if you have questions. COMMON BRAND NAME(S): Qsymia What should I tell my health care provider before I take this medicine? They need to know if you have any of these conditions: -agitation -diarrhea -depression or other mental illness -diabetes -glaucoma -heart disease -high or low blood pressure -history of anorexia or other eating disorder -history of substance abuse -kidney stones or kidney disease -liver disease -lung disease like asthma, obstructive pulmonary disease, emphysema -metabolic acidosis -on a ketogenic diet -scheduled for surgery or a procedure -suicidal thoughts, plans, or attempt; a previous suicide attempt by you or a family member -taken an MAOI like Carbex, Eldepryl, Marplan, Nardil, or Parnate in last 14 days -thyroid disease -an unusual or allergic reaction to phentermine, topiramate, other medicines, foods, dyes, or preservatives -pregnant or trying to get pregnant -breast-feeding How should I use this medicine? Take this medicine by mouth with a glass of water. Follow the directions on the prescription label. Do not crush or chew. This medicine is usually taken with or without food once per day in the morning. Avoid taking this medicine in the evening. It may interfere with sleep. Take your doses at regular intervals. Do not take your  medicine more often than directed. A special MedGuide will be given to you by the pharmacist with each prescription and refill. Be sure to read this information carefully each time. Talk to your pediatrician regarding the use of this medicine in children. Special care may be needed. Overdosage: If you think you've taken too much of this medicine contact a poison control center or emergency room at once. Overdosage: If you think you have taken too much of this medicine contact a poison control center or emergency room at once. NOTE: This medicine is only for you. Do not share this medicine with others. What if I miss a dose? If you miss a dose, take it as soon as you can. If it is almost time for your next dose, take only that dose. Do not take double or extra doses. What may interact with this medicine? Do not take this medicine with any of the following medications: -MAOIs like Carbex, Eldepryl, Marplan, Nardil, and ParnateThis medicine may also interact with the following medications: -acetazolamide -amitriptyline -antihistamines for allergy, cough and cold -atropine -birth control pills -carbamazepine -certain medicines for bladder problems like oxybutynin, tolterodine -certain medicines for depression, anxiety, or psychotic disturbances -certain medicines for Parkinson's disease like benztropine, trihexyphenidyl -certain medicines for stomach problems like dicyclomine, hyoscyamine -certain medicines for travel sickness like scopolamine -dichlorphenamide -digoxin -diltiazem -diuretics -hydrochlorothiazide -ipratropium -lithium -medicines for diabetes -medicines for pain, sleep, or muscle relaxation -methazolamide -phenytoin -pioglitazone -stimulant medicines for attention disorders, weight loss, or to stay awake -valproic acid -zonisamide This list may not describe all possible interactions. Give your health care provider a list of all the medicines, herbs, non-prescription  drugs, or dietary supplements you use. Also tell them if you smoke, drink alcohol, or use illegal  drugs. Some items may interact with your medicine. What should I watch for while using this medicine? Visit your doctor or health care professional for regular checks on your progress. This medicine is intended to be used in addition to a healthy diet and appropriate exercise. The best results are achieved this way. Do not increase or in any way change your dose without consulting your doctor or health care professional. Do not take this medicine within 6 hours of bedtime. It can keep you from getting to sleep. Avoid drinks that contain caffeine and try to stick to a regular bedtime every night. Do not stop taking this medicine suddenly. This increases the risk of seizures. This medicine can decrease sweating and increase your body temperature. Watch for signs of deceased sweating or fever. Avoid extreme heat, hot baths, and saunas. Be careful about exercising, especially in hot weather. Contact your health care provider right away if you notice a fever or decrease in sweating. You should drink plenty of fluids while taking this medicine. If you have had kidney stones in the past, this will help to reduce your chances of forming kidney stones. If you have stomach pain, with nausea or vomiting and yellowing of your eyes or skin, call your doctor immediately. You may get drowsy or dizzy. Do not drive, use machinery, or do anything that needs mental alertness until you know how this medicine affects you. Do not stand or sit up quickly, especially if you are an older patient. This reduces the risk of dizzy or fainting spells. Alcohol may increase dizziness and drowsiness. Avoid alcoholic drinks. This medicine may affect blood sugar levels. If you have diabetes, check with your doctor or health care professional before you change your diet or the dose of your diabetic medicine. Patients and their families should  watch out for worsening depression or thoughts of suicide. Also watch out for sudden changes in feelings such as feeling anxious, agitated, panicky, irritable, hostile, aggressive, impulsive, severely restless, overly excited and hyperactive, or not being able to sleep. If this happens, especially at the beginning of treatment or after a change in dose, call your health care professional. If you notice blurred vision, eye pain, or other eye problems, seek medical attention at once for an eye exam. This medicine may increase the chance of developing metabolic acidosis. If left untreated, this can cause kidney stones, bone disease, or slowed growth in children. Symptoms include breathing fast, fatigue, loss of appetite, irregular heartbeat, or loss of consciousness. Call your doctor immediately if you experience any of these side effects. Also, tell your doctor about any surgery you plan on having while taking this medicine since this may increase your risk for metabolic acidosis. Women who become pregnant while using this medicine should contact their physician immediately. You should also contact The Qsymia Pregnancy Surveillance Program which is a program that monitors pregnancies that occur during treatment. Contact the program by calling [phone]. What side effects may I notice from receiving this medicine? Side effects that you should report to your doctor or health care professional as soon as possible: -allergic reactions like skin rash, itching or hives, swelling of the face, lips, or tongue -blood in the urine -changes in vision -chest pain or chest tightness -confusion -depressed mood -difficulty breathing -dizziness -fast or irregular heartbeat -feeling anxious -irritable -loss of appetite -low blood pressure -pain in the lower back or side -pain, tingling, numbness in the hands or feet -pain when urinating -palpitations -redness, blistering, peeling or  loosening of the skin,  including inside the mouth -shortness of breath -suicidal thoughts or other mood changes -trouble passing urine or change in the amount of urine -trouble walking, dizziness, loss of balance or coordination -unusually weak or tired -vomiting Side effects that usually do not require medical attention (Report these to your doctor or health care professional if they continue or are bothersome.): -change in sex drive or performance -changes in vision -constipation -diarrhea -dry mouth -headache -nausea -tremors -trouble sleeping -upset stomach This list may not describe all possible side effects. Call your doctor for medical advice about side effects. You may report side effects to FDA at 1-800-FDA-1088. Where should I keep my medicine? Keep out of the reach of children. This medicine can be abused. Keep your medicine in a safe place to protect it from theft. Do not share this medicine with anyone. Selling or giving away this medicine is dangerous and against the law. Store at room temperature between 15 and 25 degrees C (59 and 77 degrees F). Throw away any unused medicine after the expiration date. NOTE: This sheet is a summary. It may not cover all possible information. If you have questions about this medicine, talk to your doctor, pharmacist, or health care provider.  2015, [employer]vier/Gold Standard. (2010-09-23 13:56:53)

## 2013-10-27 NOTE — Telephone Encounter (Signed)
Call pt: If decides she is interested in Qsymia then recommend speak with one of Dr. Harrington Challenger' partners.  I don't want to step on toes.

## 2013-10-27 NOTE — Progress Notes (Signed)
Subjective:    Patient ID: Sally [name], female    DOB: 03-08-1945, 68 y.o.   MRN: [id number]  HPI Rash - She  Has some white spot on her arms she wants me to look at today. Noticed most of them this summer. No pain or itching. Has a lesion on her right inner thigh as well. It is raised. Says has gotten a little larger.  No pain or itching.    Hypertension- Pt denies chest pain, SOB, dizziness, or heart palpitations.  Taking meds as directed w/o problems.  Denies medication side effects.    Weight gain - Want to discuss options.  She says she has been on phentermine but raises her blood pressure. It does work. She took Bontril about 20 years ago and says it worked well for her and wants to be willing to prescribe that. She denies any chest pain or shortness of breath. She's also tried Belviq in the past and did not like the way it made her feel. She says that her current PCP no longer prescribes weight loss medications but that he does have a partner that she has seen her in the past who  prescribes these medications.    Review of Systems     Objective:   Physical Exam  Constitutional: She is oriented to person, place, and time. She appears well-nourished.  HENT:  Head: Normocephalic and atraumatic.  Neurological: She is alert and oriented to person, place, and time.  Skin: Skin is warm and dry.  Hypopigmented 2-5 mm macules on her forearms. On her right inner thigh she has an approximately 7 mm raised rough appearing well-demarcated round lesion consistent with a seborrheic her keratosis versus a wart. She has multiple solar lentigo is on her legs and arms and face.  Psychiatric: She has a normal mood and affect.          Assessment & Plan:  Vitiligo/solar lentigo/seborrheic keratosis - reassured her that the hyperpigmentation on her arms is consistent with vitiligo. The lesion on her right inner thigh is consistent with a seborrheic keratosis versus a wart. We discussed potential  treatment options for this. She opted not for treatment. I also discussed her solar lentigo is explained that these are sun damaged we discussed treatment options for that as well. Recommend avoiding pain being and using high SPF sunscreen or even wearing a rash card to help control SPF.  HTN - Repeat BP better but not at goal. Recommend she follow up with her PCP to address her elevated blood pressure.  Weight gain - we discussed currently off medications on the market and keep clean Qsymia, Contrave, Belviq, and Sexenda. I recommended the Smart phone application my fitness PAL to help her keep track of calories in the get back into a walking routine. At one point she did great job in losing the most 40 pounds by walking and his 3 miles per day encouraged her to get back into this. She's not a candidate for Contrave because she is on narcotic prescription medication. I did give her a handout on Qsymia so that she can learn more about it. She might tolerate a lower dose the phentermine without raising her blood pressure but would require medical management and monitoring. I recommend that she followup with her PCP or one of his partners. It sounds like they're willing to work with her as far as medications or weight loss I recommended she be seen there. I think it makes it difficult  to see 2 different primary care providers.Won't rx Bontil bc of heart affects and lowers seizure threshold.

## 2013-10-28 NOTE — Telephone Encounter (Signed)
Left message for patient to return call.

## 2013-10-31 DIAGNOSIS — M7710 Lateral epicondylitis, unspecified elbow: Secondary | ICD-10-CM | POA: Diagnosis not present

## 2013-10-31 DIAGNOSIS — M76899 Other specified enthesopathies of unspecified lower limb, excluding foot: Secondary | ICD-10-CM | POA: Diagnosis not present

## 2013-10-31 DIAGNOSIS — M19019 Primary osteoarthritis, unspecified shoulder: Secondary | ICD-10-CM | POA: Diagnosis not present

## 2013-11-14 DIAGNOSIS — M76899 Other specified enthesopathies of unspecified lower limb, excluding foot: Secondary | ICD-10-CM | POA: Diagnosis not present

## 2013-11-14 DIAGNOSIS — M129 Arthropathy, unspecified: Secondary | ICD-10-CM | POA: Diagnosis not present

## 2013-11-14 DIAGNOSIS — M1990 Unspecified osteoarthritis, unspecified site: Secondary | ICD-10-CM | POA: Diagnosis not present

## 2013-11-14 DIAGNOSIS — H40019 Open angle with borderline findings, low risk, unspecified eye: Secondary | ICD-10-CM | POA: Diagnosis not present

## 2013-11-14 DIAGNOSIS — Z79899 Other long term (current) drug therapy: Secondary | ICD-10-CM | POA: Diagnosis not present

## 2013-11-14 DIAGNOSIS — G894 Chronic pain syndrome: Secondary | ICD-10-CM | POA: Diagnosis not present

## 2013-11-14 DIAGNOSIS — R635 Abnormal weight gain: Secondary | ICD-10-CM | POA: Diagnosis not present

## 2013-11-25 DIAGNOSIS — M19019 Primary osteoarthritis, unspecified shoulder: Secondary | ICD-10-CM | POA: Diagnosis not present

## 2013-11-28 DIAGNOSIS — M129 Arthropathy, unspecified: Secondary | ICD-10-CM | POA: Diagnosis not present

## 2013-11-28 DIAGNOSIS — K5900 Constipation, unspecified: Secondary | ICD-10-CM | POA: Diagnosis not present

## 2013-11-28 DIAGNOSIS — E559 Vitamin D deficiency, unspecified: Secondary | ICD-10-CM | POA: Diagnosis not present

## 2013-11-28 DIAGNOSIS — R635 Abnormal weight gain: Secondary | ICD-10-CM | POA: Diagnosis not present

## 2013-11-29 ENCOUNTER — Other Ambulatory Visit: Payer: Self-pay | Admitting: Orthopedic Surgery

## 2013-11-29 DIAGNOSIS — M19019 Primary osteoarthritis, unspecified shoulder: Secondary | ICD-10-CM

## 2013-12-02 ENCOUNTER — Telehealth: Payer: Self-pay | Admitting: Cardiology

## 2013-12-02 NOTE — Telephone Encounter (Signed)
Pt.notified

## 2013-12-02 NOTE — Telephone Encounter (Signed)
Faxed to [facility].  

## 2013-12-02 NOTE — Telephone Encounter (Signed)
Received at fax from [facility] and sports medicine for surgical clearance for right total shoulder replacement.  Patient has no cardiac history .  She had a nuclear stress test in 2013 that showed no ischemia and normal 2D echo.  She had the left should replaced last year and did well.  My CMA, Amy Stegall, spoke with the patient and she denies any chest pain, SOB, DOE.  Patient is low risk from a cardiac standpoint for surgery.

## 2013-12-06 DIAGNOSIS — K5901 Slow transit constipation: Secondary | ICD-10-CM | POA: Diagnosis not present

## 2013-12-07 ENCOUNTER — Ambulatory Visit
Admission: RE | Admit: 2013-12-07 | Discharge: 2013-12-07 | Disposition: A | Discharge status: Home / Self Care | Payer: Medicare Other | Source: Ambulatory Visit | Attending: Orthopedic Surgery | Admitting: Orthopedic Surgery

## 2013-12-07 DIAGNOSIS — M19011 Primary osteoarthritis, right shoulder: Secondary | ICD-10-CM | POA: Diagnosis not present

## 2013-12-07 DIAGNOSIS — M19019 Primary osteoarthritis, unspecified shoulder: Secondary | ICD-10-CM

## 2013-12-07 DIAGNOSIS — M25411 Effusion, right shoulder: Secondary | ICD-10-CM | POA: Diagnosis not present

## 2013-12-09 ENCOUNTER — Encounter (HOSPITAL_COMMUNITY): Payer: Self-pay | Admitting: Pharmacy Technician

## 2013-12-12 DIAGNOSIS — M25511 Pain in right shoulder: Secondary | ICD-10-CM | POA: Diagnosis not present

## 2013-12-12 DIAGNOSIS — M19011 Primary osteoarthritis, right shoulder: Secondary | ICD-10-CM | POA: Diagnosis not present

## 2013-12-12 DIAGNOSIS — M150 Primary generalized (osteo)arthritis: Secondary | ICD-10-CM | POA: Diagnosis not present

## 2013-12-12 DIAGNOSIS — G894 Chronic pain syndrome: Secondary | ICD-10-CM | POA: Diagnosis not present

## 2013-12-14 ENCOUNTER — Encounter (HOSPITAL_COMMUNITY): Payer: Self-pay

## 2013-12-14 ENCOUNTER — Encounter (HOSPITAL_COMMUNITY)
Admission: RE | Admit: 2013-12-14 | Discharge: 2013-12-14 | Disposition: A | Discharge status: Home / Self Care | Payer: Medicare Other | Source: Ambulatory Visit | Attending: Orthopedic Surgery | Admitting: Orthopedic Surgery

## 2013-12-14 DIAGNOSIS — M1990 Unspecified osteoarthritis, unspecified site: Secondary | ICD-10-CM | POA: Insufficient documentation

## 2013-12-14 DIAGNOSIS — Z86010 Personal history of colonic polyps: Secondary | ICD-10-CM | POA: Insufficient documentation

## 2013-12-14 DIAGNOSIS — Z87440 Personal history of urinary (tract) infections: Secondary | ICD-10-CM | POA: Insufficient documentation

## 2013-12-14 DIAGNOSIS — M19011 Primary osteoarthritis, right shoulder: Secondary | ICD-10-CM | POA: Insufficient documentation

## 2013-12-14 DIAGNOSIS — Z01812 Encounter for preprocedural laboratory examination: Secondary | ICD-10-CM | POA: Insufficient documentation

## 2013-12-14 DIAGNOSIS — E039 Hypothyroidism, unspecified: Secondary | ICD-10-CM | POA: Diagnosis not present

## 2013-12-14 DIAGNOSIS — M797 Fibromyalgia: Secondary | ICD-10-CM | POA: Diagnosis not present

## 2013-12-14 DIAGNOSIS — R7309 Other abnormal glucose: Secondary | ICD-10-CM | POA: Diagnosis not present

## 2013-12-14 DIAGNOSIS — E669 Obesity, unspecified: Secondary | ICD-10-CM | POA: Diagnosis not present

## 2013-12-14 DIAGNOSIS — Z0000 Encounter for general adult medical examination without abnormal findings: Secondary | ICD-10-CM | POA: Diagnosis not present

## 2013-12-14 DIAGNOSIS — Z01818 Encounter for other preprocedural examination: Secondary | ICD-10-CM | POA: Diagnosis not present

## 2013-12-14 DIAGNOSIS — Z79899 Other long term (current) drug therapy: Secondary | ICD-10-CM | POA: Diagnosis not present

## 2013-12-14 DIAGNOSIS — Z01810 Encounter for preprocedural cardiovascular examination: Secondary | ICD-10-CM | POA: Diagnosis not present

## 2013-12-14 DIAGNOSIS — I10 Essential (primary) hypertension: Secondary | ICD-10-CM | POA: Insufficient documentation

## 2013-12-14 HISTORY — DX: Prediabetes: R73.03

## 2013-12-14 LAB — TYPE AND SCREEN
ABO/RH(D): A POS
ANTIBODY SCREEN: NEGATIVE

## 2013-12-14 LAB — CBC WITH DIFFERENTIAL/PLATELET
BASOS PCT: 0 % (ref 0–1)
Basophils Absolute: 0 10*3/uL (ref 0.0–0.1)
Eosinophils Absolute: 0.1 10*3/uL (ref 0.0–0.7)
Eosinophils Relative: 1 % (ref 0–5)
HEMATOCRIT: 40.5 % (ref 36.0–46.0)
HEMOGLOBIN: 13.3 g/dL (ref 12.0–15.0)
Lymphocytes Relative: 38 % (ref 12–46)
Lymphs Abs: 3.6 10*3/uL (ref 0.7–4.0)
MCH: 29 pg (ref 26.0–34.0)
MCHC: 32.8 g/dL (ref 30.0–36.0)
MCV: 88.2 fL (ref 78.0–100.0)
MONO ABS: 0.6 10*3/uL (ref 0.1–1.0)
MONOS PCT: 7 % (ref 3–12)
Neutro Abs: 5.2 10*3/uL (ref 1.7–7.7)
Neutrophils Relative %: 54 % (ref 43–77)
Platelets: 367 10*3/uL (ref 150–400)
RBC: 4.59 MIL/uL (ref 3.87–5.11)
RDW: 12.7 % (ref 11.5–15.5)
WBC: 9.6 10*3/uL (ref 4.0–10.5)

## 2013-12-14 LAB — COMPREHENSIVE METABOLIC PANEL
ALBUMIN: 4 g/dL (ref 3.5–5.2)
ALT: 17 U/L (ref 0–35)
ANION GAP: 15 (ref 5–15)
AST: 19 U/L (ref 0–37)
Alkaline Phosphatase: 82 U/L (ref 39–117)
BUN: 9 mg/dL (ref 6–23)
CO2: 27 mEq/L (ref 19–32)
CREATININE: 0.81 mg/dL (ref 0.50–1.10)
Calcium: 9.2 mg/dL (ref 8.4–10.5)
Chloride: 98 mEq/L (ref 96–112)
GFR calc Af Amer: 85 mL/min — ABNORMAL LOW (ref >90)
GFR calc non Af Amer: 73 mL/min — ABNORMAL LOW (ref >90)
Glucose, Bld: 72 mg/dL (ref 70–99)
Potassium: 3.9 mEq/L (ref 3.7–5.3)
Sodium: 140 mEq/L (ref 137–147)
TOTAL PROTEIN: 7.8 g/dL (ref 6.0–8.3)
Total Bilirubin: 0.2 mg/dL — ABNORMAL LOW (ref 0.3–1.2)

## 2013-12-14 LAB — PROTIME-INR
INR: 1.04 (ref 0.00–1.49)
PROTHROMBIN TIME: 13.7 s (ref 11.6–15.2)

## 2013-12-14 LAB — APTT: aPTT: 36 seconds (ref 24–37)

## 2013-12-14 NOTE — Pre-Procedure Instructions (Signed)
Sally [name]  12/14/2013   Your procedure is scheduled on: Thursday, December 22, 2013  Report to [facility] Admitting at 5:30 AM.  Call this number if you have problems the morning of surgery: [phone]   Remember:   Do not eat food or drink liquids after midnight Wednesday, December 21, 2013   Take these medicines the morning of surgery with A SIP OF WATER: OxyCODONE,  thyroid (ARMOUR) Stop taking Aspirin, vitamins, and herbal medications. Do not take any NSAIDs ie: Ibuprofen, Advil, Naproxen or any medication containing Aspirin; stop 5 days prior to procedure ( Sat. 12/17/13).   Do not wear jewelry, make-up or nail polish.  Do not wear lotions, powders, or perfumes. You may not wear deodorant.  Do not shave 48 hours prior to surgery.  Do not bring valuables to the hospital.  [facility] is not responsible for any belongings or valuables.               Contacts, dentures or bridgework may not be worn into surgery.  Leave suitcase in the car. After surgery it may be brought to your room.  For patients admitted to the hospital, discharge time is determined by your treatment team.               Patients discharged the day of surgery will not be allowed to drive home.  Name and phone number of your driver:   Special Instructions:  Special Instructions:Special Instructions: [facility] - Preparing for Surgery  Before surgery, you can play an important role.  Because skin is not sterile, your skin needs to be as free of germs as possible.  You can reduce the number of germs on you skin by washing with CHG (chlorahexidine gluconate) soap before surgery.  CHG is an antiseptic cleaner which kills germs and bonds with the skin to continue killing germs even after washing.  Please DO NOT use if you have an allergy to CHG or antibacterial soaps.  If your skin becomes reddened/irritated stop using the CHG and inform your nurse when you arrive at Short Stay.  Do not shave (including  legs and underarms) for at least 48 hours prior to the first CHG shower.  You may shave your face.  Please follow these instructions carefully:   1.  Shower with CHG Soap the night before surgery and the morning of Surgery.  2.  If you choose to wash your hair, wash your hair first as usual with your normal shampoo.  3.  After you shampoo, rinse your hair and body thoroughly to remove the Shampoo.  4.  Use CHG as you would any other liquid soap.  You can apply chg directly  to the skin and wash gently with scrungie or a clean washcloth.  5.  Apply the CHG Soap to your body ONLY FROM THE NECK DOWN.  Do not use on open wounds or open sores.  Avoid contact with your eyes, ears, mouth and genitals (private parts).  Wash genitals (private parts) with your normal soap.  6.  Wash thoroughly, paying special attention to the area where your surgery will be performed.  7.  Thoroughly rinse your body with warm water from the neck down.  8.  DO NOT shower/wash with your normal soap after using and rinsing off the CHG Soap.  9.  Pat yourself dry with a clean towel.            10.  Wear clean pajamas.  11.  Place clean sheets on your bed the night of your first shower and do not sleep with pets.  Day of Surgery  Do not apply any lotions/deodorants the morning of surgery.  Please wear clean clothes to the hospital/surgery center.   Please read over the following fact sheets that you were given: Pain Booklet, Coughing and Deep Breathing, Blood Transfusion Information and Surgical Site Infection Prevention

## 2013-12-16 LAB — URINE CULTURE: Colony Count: >=100000

## 2013-12-21 MED ORDER — CEFAZOLIN SODIUM-DEXTROSE 2-3 GM-% IV SOLR
2.0000 g | INTRAVENOUS | Status: AC
  Administered 2013-12-22: 2 g via INTRAVENOUS
  Filled 2013-12-21: qty 50

## 2013-12-22 ENCOUNTER — Encounter (HOSPITAL_COMMUNITY)
Admission: RE | Disposition: A | Discharge status: Home / Self Care | Payer: Self-pay | Source: Ambulatory Visit | Attending: Orthopedic Surgery

## 2013-12-22 ENCOUNTER — Encounter (HOSPITAL_COMMUNITY): Payer: Medicare Other | Admitting: Certified Registered Nurse Anesthetist

## 2013-12-22 ENCOUNTER — Inpatient Hospital Stay (HOSPITAL_COMMUNITY): Payer: Medicare Other | Admitting: Certified Registered Nurse Anesthetist

## 2013-12-22 ENCOUNTER — Inpatient Hospital Stay (HOSPITAL_COMMUNITY)
Admission: RE | Admit: 2013-12-22 | Discharge: 2013-12-24 | DRG: 483 | Disposition: A | Discharge status: Home / Self Care | Payer: Medicare Other | Source: Ambulatory Visit | Attending: Orthopedic Surgery | Admitting: Orthopedic Surgery

## 2013-12-22 ENCOUNTER — Inpatient Hospital Stay (HOSPITAL_COMMUNITY): Payer: Medicare Other

## 2013-12-22 ENCOUNTER — Encounter (HOSPITAL_COMMUNITY): Payer: Self-pay | Admitting: Surgery

## 2013-12-22 DIAGNOSIS — G894 Chronic pain syndrome: Secondary | ICD-10-CM | POA: Diagnosis present

## 2013-12-22 DIAGNOSIS — Z96611 Presence of right artificial shoulder joint: Secondary | ICD-10-CM | POA: Diagnosis not present

## 2013-12-22 DIAGNOSIS — M797 Fibromyalgia: Secondary | ICD-10-CM | POA: Diagnosis not present

## 2013-12-22 DIAGNOSIS — E039 Hypothyroidism, unspecified: Secondary | ICD-10-CM | POA: Diagnosis present

## 2013-12-22 DIAGNOSIS — G8918 Other acute postprocedural pain: Secondary | ICD-10-CM | POA: Diagnosis not present

## 2013-12-22 DIAGNOSIS — M25511 Pain in right shoulder: Secondary | ICD-10-CM | POA: Diagnosis present

## 2013-12-22 DIAGNOSIS — M1990 Unspecified osteoarthritis, unspecified site: Secondary | ICD-10-CM | POA: Diagnosis not present

## 2013-12-22 DIAGNOSIS — M19011 Primary osteoarthritis, right shoulder: Secondary | ICD-10-CM | POA: Diagnosis not present

## 2013-12-22 DIAGNOSIS — Z87891 Personal history of nicotine dependence: Secondary | ICD-10-CM

## 2013-12-22 DIAGNOSIS — E669 Obesity, unspecified: Secondary | ICD-10-CM | POA: Diagnosis present

## 2013-12-22 DIAGNOSIS — Z471 Aftercare following joint replacement surgery: Secondary | ICD-10-CM | POA: Diagnosis not present

## 2013-12-22 DIAGNOSIS — I10 Essential (primary) hypertension: Secondary | ICD-10-CM | POA: Diagnosis not present

## 2013-12-22 DIAGNOSIS — N390 Urinary tract infection, site not specified: Secondary | ICD-10-CM | POA: Diagnosis not present

## 2013-12-22 HISTORY — PX: TOTAL SHOULDER ARTHROPLASTY: SHX126

## 2013-12-22 SURGERY — ARTHROPLASTY, SHOULDER, TOTAL
Anesthesia: Regional | Laterality: Right

## 2013-12-22 MED ORDER — FENTANYL CITRATE 0.05 MG/ML IJ SOLN
INTRAMUSCULAR | Status: DC | PRN
  Administered 2013-12-22: 50 ug via INTRAVENOUS

## 2013-12-22 MED ORDER — POVIDONE-IODINE 7.5 % EX SOLN: Freq: Once | CUTANEOUS | Status: DC

## 2013-12-22 MED ORDER — NEOSTIGMINE METHYLSULFATE 10 MG/10ML IV SOLN
INTRAVENOUS | Status: DC | PRN
  Administered 2013-12-22: 4 mg via INTRAVENOUS

## 2013-12-22 MED ORDER — METOCLOPRAMIDE HCL 5 MG/ML IJ SOLN: 5.0000 mg | Freq: Three times a day (TID) | INTRAMUSCULAR | Status: DC | PRN

## 2013-12-22 MED ORDER — MIDAZOLAM HCL 5 MG/5ML IJ SOLN
INTRAMUSCULAR | Status: DC | PRN
  Administered 2013-12-22: 2 mg via INTRAVENOUS

## 2013-12-22 MED ORDER — METHOCARBAMOL 500 MG PO TABS
500.0000 mg | ORAL_TABLET | Freq: Four times a day (QID) | ORAL | Status: DC | PRN
  Administered 2013-12-23 – 2013-12-24 (×6): 500 mg via ORAL
  Filled 2013-12-22 (×7): qty 1

## 2013-12-22 MED ORDER — SODIUM CHLORIDE 0.9 % IJ SOLN
INTRAMUSCULAR | Status: DC | PRN
  Administered 2013-12-22: 20 mL

## 2013-12-22 MED ORDER — HYDROMORPHONE HCL 1 MG/ML IJ SOLN
INTRAMUSCULAR | Status: AC
  Filled 2013-12-22: qty 1

## 2013-12-22 MED ORDER — IRBESARTAN 150 MG PO TABS
150.0000 mg | ORAL_TABLET | Freq: Every day | ORAL | Status: DC
  Administered 2013-12-22: 150 mg via ORAL
  Filled 2013-12-22 (×3): qty 1

## 2013-12-22 MED ORDER — OXYCODONE-ACETAMINOPHEN 5-325 MG PO TABS
1.0000 | ORAL_TABLET | ORAL | Status: DC | PRN
  Administered 2013-12-23 (×3): 2 via ORAL
  Administered 2013-12-23 (×2): 1 via ORAL
  Administered 2013-12-23: 2 via ORAL
  Administered 2013-12-24 (×2): 1 via ORAL
  Filled 2013-12-22: qty 2
  Filled 2013-12-22: qty 1
  Filled 2013-12-22: qty 2
  Filled 2013-12-22: qty 1
  Filled 2013-12-22 (×3): qty 2
  Filled 2013-12-22: qty 1
  Filled 2013-12-22: qty 2

## 2013-12-22 MED ORDER — LIDOCAINE HCL (CARDIAC) 20 MG/ML IV SOLN
INTRAVENOUS | Status: AC
  Filled 2013-12-22: qty 5

## 2013-12-22 MED ORDER — METHOCARBAMOL 1000 MG/10ML IJ SOLN
500.0000 mg | Freq: Four times a day (QID) | INTRAVENOUS | Status: DC | PRN
  Filled 2013-12-22: qty 5

## 2013-12-22 MED ORDER — METOCLOPRAMIDE HCL 10 MG PO TABS: 5.0000 mg | ORAL_TABLET | Freq: Three times a day (TID) | ORAL | Status: DC | PRN

## 2013-12-22 MED ORDER — HYDROMORPHONE HCL 1 MG/ML IJ SOLN
0.2500 mg | INTRAMUSCULAR | Status: DC | PRN
  Administered 2013-12-22: 0.5 mg via INTRAVENOUS
  Administered 2013-12-22: 0.25 mg via INTRAVENOUS

## 2013-12-22 MED ORDER — ROCURONIUM BROMIDE 50 MG/5ML IV SOLN
INTRAVENOUS | Status: AC
  Filled 2013-12-22: qty 1

## 2013-12-22 MED ORDER — SUCCINYLCHOLINE CHLORIDE 20 MG/ML IJ SOLN
INTRAMUSCULAR | Status: AC
  Filled 2013-12-22: qty 1

## 2013-12-22 MED ORDER — SODIUM CHLORIDE 0.9 % IR SOLN
Status: DC | PRN
  Administered 2013-12-22: 3000 mL

## 2013-12-22 MED ORDER — ACETAMINOPHEN 325 MG PO TABS
650.0000 mg | ORAL_TABLET | Freq: Four times a day (QID) | ORAL | Status: DC | PRN
  Administered 2013-12-24 (×2): 650 mg via ORAL
  Filled 2013-12-22 (×2): qty 2

## 2013-12-22 MED ORDER — 0.9 % SODIUM CHLORIDE (POUR BTL) OPTIME
TOPICAL | Status: DC | PRN
  Administered 2013-12-22: 1000 mL

## 2013-12-22 MED ORDER — OXYCODONE HCL 5 MG PO TABS
5.0000 mg | ORAL_TABLET | ORAL | Status: DC | PRN
  Administered 2013-12-22 – 2013-12-24 (×5): 10 mg via ORAL
  Filled 2013-12-22 (×5): qty 2

## 2013-12-22 MED ORDER — FLEET ENEMA 7-19 GM/118ML RE ENEM: 1.0000 | ENEMA | Freq: Once | RECTAL | Status: AC | PRN

## 2013-12-22 MED ORDER — MORPHINE SULFATE 2 MG/ML IJ SOLN
1.0000 mg | INTRAMUSCULAR | Status: DC | PRN
  Administered 2013-12-22 (×3): 1 mg via INTRAVENOUS
  Filled 2013-12-22: qty 1

## 2013-12-22 MED ORDER — MORPHINE SULFATE 2 MG/ML IJ SOLN
INTRAMUSCULAR | Status: AC
  Filled 2013-12-22: qty 1

## 2013-12-22 MED ORDER — GLYCOPYRROLATE 0.2 MG/ML IJ SOLN
INTRAMUSCULAR | Status: DC | PRN
  Administered 2013-12-22: 0.6 mg via INTRAVENOUS

## 2013-12-22 MED ORDER — PROPOFOL 10 MG/ML IV BOLUS
INTRAVENOUS | Status: AC
  Filled 2013-12-22: qty 20

## 2013-12-22 MED ORDER — THYROID 120 MG PO TABS
120.0000 mg | ORAL_TABLET | Freq: Every day | ORAL | Status: DC
  Administered 2013-12-23 – 2013-12-24 (×2): 120 mg via ORAL
  Filled 2013-12-22 (×3): qty 1

## 2013-12-22 MED ORDER — CEFAZOLIN SODIUM-DEXTROSE 2-3 GM-% IV SOLR
2.0000 g | Freq: Four times a day (QID) | INTRAVENOUS | Status: AC
  Administered 2013-12-22 – 2013-12-23 (×3): 2 g via INTRAVENOUS
  Filled 2013-12-22 (×3): qty 50

## 2013-12-22 MED ORDER — DIETHYLPROPION HCL 25 MG PO TABS: 75.0000 mg | ORAL_TABLET | Freq: Every day | ORAL | Status: DC

## 2013-12-22 MED ORDER — OXYCODONE HCL ER 20 MG PO T12A
20.0000 mg | EXTENDED_RELEASE_TABLET | Freq: Three times a day (TID) | ORAL | Status: DC
Start: 2013-12-22 — End: 2013-12-24
  Administered 2013-12-22 – 2013-12-24 (×7): 20 mg via ORAL
  Filled 2013-12-22 (×7): qty 1

## 2013-12-22 MED ORDER — MENTHOL 3 MG MT LOZG: 1.0000 | LOZENGE | OROMUCOSAL | Status: DC | PRN

## 2013-12-22 MED ORDER — ACETAMINOPHEN 650 MG RE SUPP: 650.0000 mg | Freq: Four times a day (QID) | RECTAL | Status: DC | PRN

## 2013-12-22 MED ORDER — LIDOCAINE HCL (CARDIAC) 20 MG/ML IV SOLN
INTRAVENOUS | Status: DC | PRN
  Administered 2013-12-22: 100 mg via INTRAVENOUS
  Administered 2013-12-22: 80 mg via INTRAVENOUS

## 2013-12-22 MED ORDER — PHENYLEPHRINE HCL 10 MG/ML IJ SOLN
10.0000 mg | INTRAVENOUS | Status: DC | PRN
  Administered 2013-12-22: 30 ug/min via INTRAVENOUS

## 2013-12-22 MED ORDER — POLYETHYLENE GLYCOL 3350 17 G PO PACK: 17.0000 g | PACK | Freq: Every day | ORAL | Status: DC | PRN

## 2013-12-22 MED ORDER — BUPIVACAINE LIPOSOME 1.3 % IJ SUSP
20.0000 mL | Freq: Once | INTRAMUSCULAR | Status: AC
  Administered 2013-12-22: 20 mL
  Filled 2013-12-22: qty 20

## 2013-12-22 MED ORDER — SODIUM CHLORIDE 0.9 % IV SOLN
INTRAVENOUS | Status: DC
  Administered 2013-12-22: 23:00:00 via INTRAVENOUS

## 2013-12-22 MED ORDER — ONDANSETRON HCL 4 MG/2ML IJ SOLN
4.0000 mg | Freq: Four times a day (QID) | INTRAMUSCULAR | Status: DC | PRN
  Administered 2013-12-22: 4 mg via INTRAVENOUS
  Filled 2013-12-22: qty 2

## 2013-12-22 MED ORDER — ASPIRIN EC 325 MG PO TBEC
325.0000 mg | DELAYED_RELEASE_TABLET | Freq: Two times a day (BID) | ORAL | Status: DC
Start: 2013-12-22 — End: 2013-12-24
  Administered 2013-12-22 – 2013-12-24 (×3): 325 mg via ORAL
  Filled 2013-12-22 (×6): qty 1

## 2013-12-22 MED ORDER — ONDANSETRON HCL 4 MG/2ML IJ SOLN: 4.0000 mg | Freq: Once | INTRAMUSCULAR | Status: DC | PRN

## 2013-12-22 MED ORDER — OXYCODONE HCL ER 20 MG PO T12A: 20.0000 mg | EXTENDED_RELEASE_TABLET | Freq: Two times a day (BID) | ORAL | Status: DC

## 2013-12-22 MED ORDER — ZOLPIDEM TARTRATE 5 MG PO TABS: 5.0000 mg | ORAL_TABLET | Freq: Every evening | ORAL | Status: DC | PRN

## 2013-12-22 MED ORDER — MIDAZOLAM HCL 2 MG/2ML IJ SOLN
INTRAMUSCULAR | Status: AC
  Filled 2013-12-22: qty 2

## 2013-12-22 MED ORDER — HYDROMORPHONE HCL 1 MG/ML IJ SOLN
0.5000 mg | INTRAMUSCULAR | Status: DC | PRN
  Administered 2013-12-22 – 2013-12-24 (×9): 1 mg via INTRAVENOUS
  Filled 2013-12-22 (×9): qty 1

## 2013-12-22 MED ORDER — DOCUSATE SODIUM 100 MG PO CAPS
100.0000 mg | ORAL_CAPSULE | Freq: Two times a day (BID) | ORAL | Status: DC
  Administered 2013-12-23 – 2013-12-24 (×2): 100 mg via ORAL
  Filled 2013-12-22 (×5): qty 1

## 2013-12-22 MED ORDER — DIPHENHYDRAMINE HCL 12.5 MG/5ML PO ELIX: 12.5000 mg | ORAL_SOLUTION | ORAL | Status: DC | PRN

## 2013-12-22 MED ORDER — FENTANYL CITRATE 0.05 MG/ML IJ SOLN
INTRAMUSCULAR | Status: AC
  Filled 2013-12-22: qty 5

## 2013-12-22 MED ORDER — BISACODYL 10 MG RE SUPP: 10.0000 mg | Freq: Every day | RECTAL | Status: DC | PRN

## 2013-12-22 MED ORDER — ALUMINUM HYDROXIDE GEL 320 MG/5ML PO SUSP
15.0000 mL | ORAL | Status: DC | PRN
  Filled 2013-12-22: qty 30

## 2013-12-22 MED ORDER — PHENOL 1.4 % MT LIQD: 1.0000 | OROMUCOSAL | Status: DC | PRN

## 2013-12-22 MED ORDER — ONDANSETRON HCL 4 MG/2ML IJ SOLN
INTRAMUSCULAR | Status: AC
  Filled 2013-12-22: qty 2

## 2013-12-22 MED ORDER — ONDANSETRON HCL 4 MG PO TABS: 4.0000 mg | ORAL_TABLET | Freq: Four times a day (QID) | ORAL | Status: DC | PRN

## 2013-12-22 MED ORDER — ROCURONIUM BROMIDE 100 MG/10ML IV SOLN
INTRAVENOUS | Status: DC | PRN
  Administered 2013-12-22: 50 mg via INTRAVENOUS

## 2013-12-22 MED ORDER — HEMOSTATIC AGENTS (NO CHARGE) OPTIME
TOPICAL | Status: DC | PRN
  Administered 2013-12-22: 1 via TOPICAL

## 2013-12-22 MED ORDER — LACTATED RINGERS IV SOLN
INTRAVENOUS | Status: DC | PRN
  Administered 2013-12-22: 07:00:00 via INTRAVENOUS

## 2013-12-22 MED ORDER — PROPOFOL 10 MG/ML IV BOLUS
INTRAVENOUS | Status: DC | PRN
  Administered 2013-12-22: 200 mg via INTRAVENOUS

## 2013-12-22 MED ORDER — ONDANSETRON HCL 4 MG/2ML IJ SOLN
INTRAMUSCULAR | Status: DC | PRN
  Administered 2013-12-22: 4 mg via INTRAVENOUS

## 2013-12-22 SURGICAL SUPPLY — 68 items
ASSEMBLY NECK TAPER FIXED 135 (Orthopedic Implant) ×2 IMPLANT
BIT DRILL 5/64X5 DISP (BIT) IMPLANT
BLADE SAW SAG 73X25 THK (BLADE) ×1
BLADE SAW SGTL 73X25 THK (BLADE) ×1 IMPLANT
BLADE SURG 15 STRL LF DISP TIS (BLADE) ×1 IMPLANT
BLADE SURG 15 STRL SS (BLADE) ×1
CEMENT BONE DEPUY (Cement) ×2 IMPLANT
CHLORAPREP W/TINT 26ML (MISCELLANEOUS) ×4 IMPLANT
CLSR STERI-STRIP ANTIMIC 1/2X4 (GAUZE/BANDAGES/DRESSINGS) ×2 IMPLANT
COVER SURGICAL LIGHT HANDLE (MISCELLANEOUS) ×2 IMPLANT
DRAPE INCISE IOBAN 66X45 STRL (DRAPES) ×4 IMPLANT
DRAPE SURG 17X23 STRL (DRAPES) ×2 IMPLANT
DRAPE U-SHAPE 47X51 STRL (DRAPES) ×2 IMPLANT
DRSG AQUACEL AG ADV 3.5X10 (GAUZE/BANDAGES/DRESSINGS) ×2 IMPLANT
DRSG MEPILEX BORDER 4X8 (GAUZE/BANDAGES/DRESSINGS) IMPLANT
ELECT BLADE 4.0 EZ CLEAN MEGAD (MISCELLANEOUS) ×2
ELECT REM PT RETURN 9FT ADLT (ELECTROSURGICAL) ×2
ELECTRODE BLDE 4.0 EZ CLN MEGD (MISCELLANEOUS) ×1 IMPLANT
ELECTRODE REM PT RTRN 9FT ADLT (ELECTROSURGICAL) ×1 IMPLANT
EVACUATOR 1/8 PVC DRAIN (DRAIN) IMPLANT
GLENOID ANCHOR PEG CROSSLK 44 (Orthopedic Implant) ×2 IMPLANT
GLOVE BIO SURGEON STRL SZ7 (GLOVE) ×2 IMPLANT
GLOVE BIO SURGEON STRL SZ7.5 (GLOVE) ×2 IMPLANT
GLOVE BIOGEL PI IND STRL 7.0 (GLOVE) ×1 IMPLANT
GLOVE BIOGEL PI IND STRL 8 (GLOVE) ×1 IMPLANT
GLOVE BIOGEL PI INDICATOR 7.0 (GLOVE) ×1
GLOVE BIOGEL PI INDICATOR 8 (GLOVE) ×1
GOWN STRL REUS W/ TWL LRG LVL3 (GOWN DISPOSABLE) ×3 IMPLANT
GOWN STRL REUS W/ TWL XL LVL3 (GOWN DISPOSABLE) ×1 IMPLANT
GOWN STRL REUS W/TWL LRG LVL3 (GOWN DISPOSABLE) ×3
GOWN STRL REUS W/TWL XL LVL3 (GOWN DISPOSABLE) ×1
HANDPIECE INTERPULSE COAX TIP (DISPOSABLE) ×1
HEAD HUMERAL STD 44X18M SHLDR (Head) ×2 IMPLANT
HEMOSTAT SURGICEL 2X14 (HEMOSTASIS) ×2 IMPLANT
HOOD PEEL AWAY FACE SHEILD DIS (HOOD) ×6 IMPLANT
KIT BASIN OR (CUSTOM PROCEDURE TRAY) ×2 IMPLANT
KIT ROOM TURNOVER OR (KITS) ×2 IMPLANT
MANIFOLD NEPTUNE II (INSTRUMENTS) ×2 IMPLANT
NEEDLE HYPO 25GX1X1/2 BEV (NEEDLE) IMPLANT
NEEDLE MAYO TROCAR (NEEDLE) ×2 IMPLANT
NS IRRIG 1000ML POUR BTL (IV SOLUTION) ×2 IMPLANT
PACK SHOULDER (CUSTOM PROCEDURE TRAY) ×2 IMPLANT
PAD ARMBOARD 7.5X6 YLW CONV (MISCELLANEOUS) ×4 IMPLANT
PIN METAGLENE 2.5 (PIN) ×2 IMPLANT
RETRIEVER SUT HEWSON (MISCELLANEOUS) ×2 IMPLANT
SET HNDPC FAN SPRY TIP SCT (DISPOSABLE) ×1 IMPLANT
SLING ARM IMMOBILIZER LRG (SOFTGOODS) ×2 IMPLANT
SLING ARM IMMOBILIZER MED (SOFTGOODS) IMPLANT
SMARTMIX MINI TOWER (MISCELLANEOUS) ×2
SPONGE LAP 18X18 X RAY DECT (DISPOSABLE) ×2 IMPLANT
SPONGE LAP 4X18 X RAY DECT (DISPOSABLE) IMPLANT
STEM AP PC10 (Stem) ×2 IMPLANT
STRIP CLOSURE SKIN 1/2X4 (GAUZE/BANDAGES/DRESSINGS) IMPLANT
SUCTION FRAZIER TIP 10 FR DISP (SUCTIONS) ×2 IMPLANT
SUPPORT WRAP ARM LG (MISCELLANEOUS) ×2 IMPLANT
SUT ETHIBOND NAB CT1 #1 30IN (SUTURE) ×6 IMPLANT
SUT MNCRL AB 4-0 PS2 18 (SUTURE) ×2 IMPLANT
SUT SILK 2 0 TIES 17X18 (SUTURE)
SUT SILK 2-0 18XBRD TIE BLK (SUTURE) IMPLANT
SUT VIC AB 0 CTB1 27 (SUTURE) IMPLANT
SUT VIC AB 2-0 CT1 27 (SUTURE) ×1
SUT VIC AB 2-0 CT1 TAPERPNT 27 (SUTURE) ×1 IMPLANT
SYR CONTROL 10ML LL (SYRINGE) IMPLANT
TAPE FIBER 2MM 7IN #2 BLUE (SUTURE) ×6 IMPLANT
TOWEL OR 17X24 6PK STRL BLUE (TOWEL DISPOSABLE) ×2 IMPLANT
TOWEL OR 17X26 10 PK STRL BLUE (TOWEL DISPOSABLE) ×2 IMPLANT
TOWER SMARTMIX MINI (MISCELLANEOUS) ×1 IMPLANT
WATER STERILE IRR 1000ML POUR (IV SOLUTION) IMPLANT

## 2013-12-22 NOTE — Progress Notes (Signed)
Utilization review completed.  

## 2013-12-22 NOTE — Discharge Instructions (Signed)

## 2013-12-22 NOTE — Op Note (Signed)
Procedure(s): TOTAL SHOULDER ARTHROPLASTY Procedure Note  Sally [name] female 68 y.o. 12/22/2013  Procedure(s) and Anesthesia Type:    * RIGHT TOTAL SHOULDER ARTHROPLASTY - General  Surgeon(s) and Role:    * Nita Sells, MD - Primary   Indications:  68 y.o. female  With endstage right shoulder arthritis. Pain and dysfunction interfered with quality of life and nonoperative treatment with activity modification, NSAIDS and injections failed.     Surgeon: Nita Sells   Assistants: Jeanmarie Hubert PA-C [facility] was present and scrubbed throughout the procedure and was essential in positioning, retraction, exposure, and closure)  Anesthesia: General endotracheal anesthesia with preoperative interscalene block given by the attending anesthesiologist    Procedure Detail  TOTAL SHOULDER ARTHROPLASTY  Findings: DePuy AP size 10 press-fit stem with a 44 x 18 centered head. 44 anchor peg glenoid cemented. Bone quality fair. Stability excellent.  Estimated Blood Loss:  200 mL         Drains: 1 medium hemovac  Blood Given: none          Specimens: none        Complications:  * No complications entered in OR log *         Disposition: PACU - hemodynamically stable.         Condition: stable    Procedure:   The patient was identified in the preoperative holding area where I personally marked the operative extremity after verifying with the patient and consent. She  was taken to the operating room where She was transferred to the   operative table.  The patient received an interscalene block in   the holding area by the attending anesthesiologist.  General anesthesia was induced   in the operating room without complication.  The patient did receive IV  Ancef prior to the commencement of the procedure.  The patient was   placed in the beach-chair position with the back raised about 30   degrees.  The nonoperative extremity and head and neck were  carefully   positioned and padded protecting against neurovascular compromise.  The   left upper extremity was then prepped and draped in the standard sterile   fashion.    The appropriate operative time-out was performed with   Anesthesia, the perioperative staff, as well as myself and we all agreed   that the right side was the correct operative site.  An approximately   10 cm incision was made from the tip of the coracoid to the center point of the   humerus at the level of the axilla.  Dissection was carried down sharply   through subcutaneous tissues and cephalic vein was identified and taken   laterally with the deltoid.  The pectoralis major was taken medially.  The   upper 1 cm of the pectoralis major was released from its attachment on   the humerus.  The clavipectoral fascia was incised just lateral to the   conjoined tendon.  This incision was carried up to but not into the   coracoacromial ligament.  Digital palpation was used to prove   integrity of the axillary nerve which was protected throughout the   procedure.  Musculocutaneous nerve was not palpated in the operative   field.  Conjoined tendon was then retracted gently medially and the   deltoid laterally.  Anterior circumflex humeral vessels were clamped and   coagulated.  The soft tissues overlying the biceps groove was incised and this   incision was carried  across the transverse humeral ligament to the base   of the coracoid.  The biceps was noted to be ruptured. An osteotomy was performed at the lesser tuberosity.  Capsule was then   released all the way down to the 6 o'clock position of the humeral head.   The humeral head was then delivered with simultaneous adduction,   extension and external rotation.  All humeral osteophytes were removed   and the anatomic neck of the humerus was marked and cut free hand at   approximately 30 degrees retroversion matching her retroversion within about 3 mm of the cuff    reflection posteriorly.  The head size was estimated to be a 44 medium   offset.  At that point, the humeral head was retracted posteriorly with   a Fukuda retractor.   Remaining portion of the capsule was released at the base of the   coracoid.  The remaining biceps anchor and the entire anterior-inferior   labrum was excised.  The posterior labrum was also excised but the   posterior capsule was not released.  The guidepin was placed bicortically with +0 elevated guide.  The reamer was used to ream to concentric bone with punctate bleeding.  This gave an excellent concentric surface.  The center hole was then drilled for an anchor peg glenoid followed by the three peripheral holes and none of the holes   exited the glenoid Erhart.  I then pulse irrigated these holes and dried   them with Surgicel.  The three peripheral holes were then   pressurized cemented and the anchor peg glenoid was placed and impacted   with an excellent fit.  The glenoid was a 44 component.  The proximal humerus was then again exposed taking care not to displace the glenoid.    The humerus was then sequentially reamed going from 6 to 10 by 2 mm incriments. The 10 mm reamer was found to have appropriate cortical contact.  A   box osteotome was then used and a 10-mm broach.  The broach handle was   removed and the trial head was placed.   Calcar reamer was used.The eccentric 44 x 18 head fit best.  With the trial implantation of the component, there was   approximately 50% posterior translation with immediate snap back to the   anatomic position.  With forward elevation, there was no tendency   towards posterior subluxation.   The trial was removed and the final implant was prepared on a back table.  The trial was removed and the final implant was prepared on a back table.   Small holes were drilled on both sides of the lesser tuberosity osteotomy, through which 3 Fibertapes were passed. The implant was then placed through the  loop of all 3 Fibertapes and impacted with an excellent press-fit. This achieved excellent anatomic reconstruction of the proximal humerus.  The joint was then copiously irrigated with pulse lavage.  The subscapularis and   lesser tuberosity osteotomy were then repaired using the 3 Fibertapes previously passed.   One #1 Ethibond was placed at the rotator interval just above   the lesser tuberosity.   Skin was closed with 2-0 Vicryl sutures in the deep dermal layer and 4-0 Monocryl in a subcuticular  running fashion.  Sterile dressings were then applied including Steri- Strips, 4x4s, ABDs and tape.  The patient was placed in a sling and allowed to awaken from general anesthesia and taken to the recovery room in stable  condition.      POSTOPERATIVE PLAN:  Early passive range of motion will be allowed with the goal of 30 degrees external rotation and a 130 degrees forward elevation.  No internal rotation at this time.  No active motion of the arm until the lesser tuberosity heals.  The patient will likely be kept in the hospital for 1-2 days and then discharged home.

## 2013-12-22 NOTE — Anesthesia Procedure Notes (Addendum)
Procedure Name: Intubation Date/Time: 12/22/2013 7:34 AM Performed by: Shirlyn Goltz Pre-anesthesia Checklist: Patient identified, Emergency Drugs available, Suction available and Patient being monitored Patient Re-evaluated:Patient Re-evaluated prior to inductionOxygen Delivery Method: Circle system utilized Preoxygenation: Pre-oxygenation with 100% oxygen Intubation Type: IV induction Ventilation: Mask ventilation without difficulty Grade View: Grade III Tube type: Oral Tube size: 7.0 mm Number of attempts: 2 (grade 3 view with mac 3 by CRNA x1 - unable to pass bougie.  grade 4 view MDA with mac 3able to pass bougie ) Airway Equipment and Method: Bougie stylet Placement Confirmation: ETT inserted through vocal cords under direct vision,  positive ETCO2 and breath sounds checked- equal and bilateral Secured at: 22 cm Tube secured with: Tape Dental Injury: Bloody posterior oropharynx    Anesthesia Regional Block:  Interscalene brachial plexus block  Pre-Anesthetic Checklist: ,, timeout performed, Correct Patient, Correct Site, Correct Laterality, Correct Procedure, Correct Position, site marked, Risks and benefits discussed,  Surgical consent,  Pre-op evaluation,  At surgeon's request and post-op pain management  Laterality: Right  Prep: Maximum Sterile Barrier Precautions used, chloraprep and alcohol swabs       Needles:  Injection technique: Single-shot  Needle Type: Stimulator Needle - 40          Additional Needles:  Procedures: nerve stimulator Interscalene brachial plexus block  Nerve Stimulator or Paresthesia:  Response: 0.5 mA, 0.1 ms, 4 cm  Additional Responses:   Narrative:  Start time: 12/22/2013 7:00 AM End time: 12/22/2013 7:06 AM Injection made incrementally with aspirations every 5 mL.  Performed by: Personally  Anesthesiologist: Sharolyn Douglas MD  Additional Notes: Pt accepts procedure w/ risks. 18cc 0.5% Marcaine w/ epi w/o difficulty or  discomfort. GES

## 2013-12-22 NOTE — Anesthesia Preprocedure Evaluation (Signed)
Anesthesia Evaluation    Airway       Dental   Pulmonary former smoker,          Cardiovascular hypertension,     Neuro/Psych  Neuromuscular disease    GI/Hepatic   Endo/Other  Hypothyroidism   Renal/GU      Musculoskeletal  (+) Arthritis -, Fibromyalgia -  Abdominal   Peds  Hematology   Anesthesia Other Findings   Reproductive/Obstetrics                           Anesthesia Physical Anesthesia Plan  ASA: II  Anesthesia Plan: General   Post-op Pain Management:    Induction: Intravenous  Airway Management Planned: Oral ETT  Additional Equipment:   Intra-op Plan:   Post-operative Plan: Extubation in OR  Informed Consent: I have reviewed the patients History and Physical, chart, labs and discussed the procedure including the risks, benefits and alternatives for the proposed anesthesia with the patient or authorized representative who has indicated his/her understanding and acceptance.     Plan Discussed with: CRNA, Anesthesiologist and Surgeon  Anesthesia Plan Comments:         Anesthesia Quick Evaluation

## 2013-12-22 NOTE — Anesthesia Postprocedure Evaluation (Signed)
  Anesthesia Post-op Note  Patient: Sally [name]  Procedure(s) Performed: Procedure(s) with comments: TOTAL SHOULDER ARTHROPLASTY (Right) - Right shoulder total arthroplasty  Patient Location: PACU  Anesthesia Type:GA combined with regional for post-op pain  Level of Consciousness: awake, alert , oriented and patient cooperative  Airway and Oxygen Therapy: Patient Spontanous Breathing  Post-op Pain: mild  Post-op Assessment: Post-op Vital signs reviewed, Patient's Cardiovascular Status Stable, Respiratory Function Stable, Patent Airway, No signs of Nausea or vomiting and Pain level controlled  Post-op Vital Signs: stable  Last Vitals:  Filed Vitals:   12/22/13 0600  BP: 151/80  Pulse: 68  Temp: 37.1 C  Resp: 18    Complications: No apparent anesthesia complications

## 2013-12-22 NOTE — Transfer of Care (Signed)
Immediate Anesthesia Transfer of Care Note  Patient: Sally [name]  Procedure(s) Performed: Procedure(s) with comments: TOTAL SHOULDER ARTHROPLASTY (Right) - Right shoulder total arthroplasty  Patient Location: PACU  Anesthesia Type:General and Regional  Level of Consciousness: awake, alert  and patient cooperative  Airway & Oxygen Therapy: Patient Spontanous Breathing and Patient connected to face mask oxygen  Post-op Assessment: Report given to PACU RN, Post -op Vital signs reviewed and stable and Patient moving all extremities  Post vital signs: Reviewed and stable  Complications: No apparent anesthesia complications

## 2013-12-22 NOTE — H&P (Signed)
Sally [name] is an 68 y.o. female.   Chief Complaint: R shoulder pain and dysfunction HPI: R shoulder endstage arthritis, failed conservative management of activity modification, NSAIDS.  Pain interferes with sleep and quality of life.  Past Medical History  Diagnosis Date  . Fibromyalgia   . Chronic pain syndrome   . Arthritis   . Hx of colonic polyps   . Hypertension   . Obesity   . Chronic UTI   . Allergy   . Hypothyroidism   . Pre-diabetes     Past Surgical History  Procedure Laterality Date  . Cesarean section      x 2   . Cholecystectomy  1982  . Tonsillectomy  1959  . Foot surgery  1992  . Knee surgery  1992  . Appendectomy  1965  . Abdominal hysterectomy  1990  . Total shoulder arthroplasty Left 11/25/2012    Procedure: TOTAL SHOULDER ARTHROPLASTY;  Surgeon: Nita Sells, MD;  Location: [facility];  Service: Orthopedics;  Laterality: Left;  LEFT TOTAL SHOULDER ARTHROPLASTY    Family History  Problem Relation Age of Onset  . Hypertension      Family history  . Thyroid disease      family history  . Cancer Mother 73    pancratic  . Cancer Father 75    hematologic malignancy   . Arthritis Sister   . Arthritis Sister    Social History:  reports that she quit smoking about 43 years ago. Her smoking use included Cigarettes. She has a 7 pack-year smoking history. She has never used smokeless tobacco. She reports that she does not drink alcohol or use illicit drugs.  Allergies:  Allergies  Allergen Reactions  . Codeine Nausea Only  . Doxycycline Nausea Only    Medications Prior to Admission  Medication Sig Dispense Refill  . Ascorbic Acid (VITAMIN C PO) Take 1 tablet by mouth daily.      . B Complex Vitamins (VITAMIN-B COMPLEX PO) Take 1 tablet by mouth daily.      Marland Kitchen BIOTIN 5000 PO Take 5,000 Units by mouth 2 (two) times a week.       . Diethylpropion HCl 25 MG TABS Take 75 mg by mouth daily.      . Magnesium 250 MG TABS Take 250 mg by mouth 2 (two)  times daily.      . Multiple Vitamins-Minerals (MULTIVITAMIN PO) Take 1 tablet by mouth daily.      Marland Kitchen OVER THE COUNTER MEDICATION Take 50 mg by mouth 2 (two) times a week. DHEA 50 mg      . OxyCODONE (OXYCONTIN) 20 mg T12A 12 hr tablet Take 20 mg by mouth every 12 (twelve) hours.      Marland Kitchen oxyCODONE-acetaminophen (PERCOCET) 10-325 MG per tablet Take 1 tablet by mouth 3 (three) times daily.       Marland Kitchen telmisartan (MICARDIS) 40 MG tablet Take 20 mg by mouth daily.       Marland Kitchen thyroid (ARMOUR) 60 MG tablet Take 120 mg by mouth daily before breakfast.      . Vitamin D, Ergocalciferol, (DRISDOL) 50000 UNITS CAPS capsule Take 50,000 Units by mouth every 7 (seven) days.        No results found for this or any previous visit (from the past 48 hour(s)). No results found.  Review of Systems  All other systems reviewed and are negative.   Blood pressure 151/80, pulse 68, temperature 98.7 F (37.1 C), temperature source Oral, resp.  rate 18, SpO2 98.00%. Physical Exam  Constitutional: She is oriented to person, place, and time. She appears well-developed and well-nourished.  HENT:  Head: Atraumatic.  Eyes: EOM are normal.  Cardiovascular: Intact distal pulses.   Respiratory: Effort normal.  Musculoskeletal:  R shoulder pain with ROM, NVID  Neurological: She is alert and oriented to person, place, and time.  Skin: Skin is warm and dry.  Psychiatric: She has a normal mood and affect.     Assessment/Plan R endstage osteoarthritis Plan R total shoulder replacement. Risks / benefits of surgery discussed Consent on chart  NPO for OR Preop antibiotics   Braylyn Kalter WILLIAM 12/22/2013, 7:10 AM

## 2013-12-23 ENCOUNTER — Encounter (HOSPITAL_COMMUNITY): Payer: Self-pay | Admitting: Orthopedic Surgery

## 2013-12-23 LAB — CBC
HCT: 33.5 % — ABNORMAL LOW (ref 36.0–46.0)
Hemoglobin: 11 g/dL — ABNORMAL LOW (ref 12.0–15.0)
MCH: 29 pg (ref 26.0–34.0)
MCHC: 32.8 g/dL (ref 30.0–36.0)
MCV: 88.4 fL (ref 78.0–100.0)
PLATELETS: 283 10*3/uL (ref 150–400)
RBC: 3.79 MIL/uL — ABNORMAL LOW (ref 3.87–5.11)
RDW: 13.4 % (ref 11.5–15.5)
WBC: 13.6 10*3/uL — AB (ref 4.0–10.5)

## 2013-12-23 LAB — BASIC METABOLIC PANEL
ANION GAP: 15 (ref 5–15)
BUN: 12 mg/dL (ref 6–23)
CO2: 25 mEq/L (ref 19–32)
Calcium: 8.2 mg/dL — ABNORMAL LOW (ref 8.4–10.5)
Chloride: 99 mEq/L (ref 96–112)
Creatinine, Ser: 1.12 mg/dL — ABNORMAL HIGH (ref 0.50–1.10)
GFR calc non Af Amer: 50 mL/min — ABNORMAL LOW (ref >90)
GFR, EST AFRICAN AMERICAN: 58 mL/min — AB (ref >90)
Glucose, Bld: 134 mg/dL — ABNORMAL HIGH (ref 70–99)
POTASSIUM: 3.5 meq/L — AB (ref 3.7–5.3)
Sodium: 139 mEq/L (ref 137–147)

## 2013-12-23 MED ORDER — WHITE PETROLATUM GEL
Status: AC
  Filled 2013-12-23: qty 5

## 2013-12-23 MED ORDER — METHOCARBAMOL 500 MG PO TABS: 500.0000 mg | ORAL_TABLET | Freq: Four times a day (QID) | ORAL | Status: DC | PRN

## 2013-12-23 MED ORDER — OXYCODONE HCL ER 20 MG PO T12A: 20.0000 mg | EXTENDED_RELEASE_TABLET | Freq: Three times a day (TID) | ORAL | Status: AC

## 2013-12-23 NOTE — Evaluation (Signed)
Occupational Therapy Evaluation Patient Details Name: Sally [name] MRN: [id number] DOB: 1945-05-24 Today's Date: 12/23/2013    History of Present Illness RTSA   Clinical Impression   This 68 yo female admitted and underwent above presents to acute OT with decreased use of RUE, increased pain, RUE, all affecting her ability to care for herself at an Independent level as she was pta. She will benefit from at least one more session of OT to follow through with exercises and any other questions about self care.    Follow Up Recommendations  No OT follow up    Equipment Recommendations  None recommended by OT       Precautions / Restrictions Precautions Precautions: Shoulder Shoulder Interventions: Shoulder sling/immobilizer;Off for dressing/bathing/exercises Required Braces or Orthoses: Sling Restrictions Weight Bearing Restrictions: Yes RUE Weight Bearing: Non weight bearing              ADL                                         General ADL Comments: We talked about issues with being able to wipe herself with her non-dominant hand post going to the bathroom---she reports she has a toliet aid at home that she can use with wet wipes.               Pertinent Vitals/Pain Pain Assessment: 0-10 Pain Score: 3  Pain Location: right shoulder with exercises Pain Descriptors / Indicators: Aching Pain Intervention(s): Monitored during session;Repositioned     Hand Dominance Right   Extremity/Trunk Assessment Upper Extremity Assessment Upper Extremity Assessment: RUE deficits/detail RUE Deficits / Details: TSA this admission; distally WNL RUE Coordination: decreased gross motor           Communication Communication Communication: No difficulties   Cognition Arousal/Alertness: Awake/alert Behavior During Therapy: WFL for tasks assessed/performed Overall Cognitive Status: Within Functional Limits for tasks assessed                       Exercises   Other Exercises Other Exercises: Supine: Pt preformed 10 reps of forward flexion using her other arm to A (pt got about 20 degrees and is aware to progress to 130 degrees by the time she goes back to see Dr. Tamera [name]); 10 reps of external rotation using a long shoe horn ( pt able to get to -60 from neutral and is aware she is to progress to 30 degrees past neutral by the time she goes back to see Dr. Tamera [name]). Pt also did  10 reps of AROM elbow flexion/extension in supine (made her aware she can also do these while standing)   Shoulder Instructions Shoulder Instructions Donning/doffing shirt without moving shoulder:  (Pt and family report they are familiar with how to use gravity to do this since pt had other shoulder done last year) Method for sponge bathing under operated UE:  (Pt and family report they are familiar with how to use gravity to do this since pt had other shoulder done last yea) Donning/doffing sling/immobilizer:  (Pt and family report they are familiar with how to use gravity to do this since pt had other shoulder done last yea) Correct positioning of sling/immobilizer:  (Pt and family report they are familiar with how to use gravity to do this since pt had other shoulder done last yea) Pendulum exercises (written home exercise program):  (NA)  ROM for elbow, wrist and digits of operated UE: Supervision/safety Sling wearing schedule (on at all times/off for ADL's): Independent Dressing change:  (NA) Positioning of UE while sleeping: [place] expects to be discharged to:: Private residence Living Arrangements: Spouse/significant other;Children Available Help at Discharge: Family;Available 24 hours/day Type of Home: House                       Home Equipment: Hand held shower head;Shower seat          Prior Functioning/Environment Level of Independence: Independent             OT Diagnosis: Generalized weakness;Acute  pain   OT Problem List: Decreased strength;Decreased range of motion;Impaired balance (sitting and/or standing);Pain;Obesity;Impaired UE functional use   OT Treatment/Interventions: Self-care/ADL training;Patient/family education;DME and/or AE instruction;Therapeutic exercise    OT Goals(Current goals can be found in the care plan section) Acute Rehab OT Goals Patient Stated Goal: home tomorrow OT Goal Formulation: With patient Time For Goal Achievement: 12/26/13 Potential to Achieve Goals: Good  OT Frequency: Min 3X/week              End of Session Equipment Utilized During Treatment:  (sling) Nurse Communication:  (I need to see pt at least one more time today and possbily tomorrow)  Activity Tolerance: Patient tolerated treatment well Patient left: in bed;with call bell/phone within reach;with family/visitor present   Time: 2979-8921 OT Time Calculation (min): 25 min Charges:  OT General Charges $OT Visit: 1 Procedure OT Evaluation $Initial OT Evaluation Tier I: 1 Procedure OT Treatments $Therapeutic Exercise: 8-22 mins  Sally [name] [phone] 12/23/2013, 11:15 AM

## 2013-12-23 NOTE — Plan of Care (Signed)
Problem: Phase II Progression Outcomes Goal: Pain controlled with appropriate interventions Outcome: Not Progressing Patient requiring scheduled oxycontin, PRN oxycodone and PRN dilaudid to stay below a 10 on the pain scale.

## 2013-12-23 NOTE — Progress Notes (Signed)
   PATIENT ID: Tye [place] J Amaro   1 Day Post-Op Procedure(s) (LRB): TOTAL SHOULDER ARTHROPLASTY (Right)  Subjective: Reports that her block wore off at 5pm yesterday, had a lot of pain overnight but improving this am with medication regimen. Comfortable at rest.   Objective:  Filed Vitals:   12/23/13 0917  BP: 93/56  Pulse: 66  Temp:   Resp:     R UE dressing with scan dried blood, otherwise clean and intact Wiggles fingers, distally NVI  Labs:   Recent Labs  12/23/13 0545  HGB 11.0*   Recent Labs  12/23/13 0545  WBC 13.6*  RBC 3.79*  HCT 33.5*  PLT 283   Recent Labs  12/23/13 0545  NA 139  K 3.5*  CL 99  CO2 25  BUN 12  CREATININE 1.12*  GLUCOSE 134*  CALCIUM 8.2*    Assessment and Plan: 1 day s/p right TSA  OT today- passive ROM limited to 130FF, 30ER  Medications changed last night to reflect baseline home meds with additional oxycodone 5-10mg  and  IV morphine for breakthough, improving this am Okay for d/c today when pain under control on oral meds and cleared by OT, patient will manage home pain with her pain mgmt physician and we will prescribe robaxin in addition. Okay if she needs to stay another night for pain control but hopefully can d/c today   VTE proph: SCDs, ASA 325mg  BID

## 2013-12-23 NOTE — Progress Notes (Signed)
Occupational Therapy Treatment Patient Details Name: Sally [name] MRN: [id number] DOB: [birth year] Today's Date: 12/23/2013    History of present illness RTSA   OT comments  This 68 yo female showing progress with external rotation this PM session, but so much with forward flexion (somewhat due to body habitus and RUE feels "heavy). Will see pt one more session tomorrow if she stays overnight. If she D/C's home then I feel she will do fine with her family to A her.  Follow Up Recommendations  No OT follow up    Equipment Recommendations  None recommended by OT       Precautions / Restrictions Precautions Precautions: Shoulder Shoulder Interventions: Shoulder sling/immobilizer;Off for dressing/bathing/exercises Required Braces or Orthoses: Sling Restrictions Weight Bearing Restrictions: Yes RUE Weight Bearing: Non weight bearing       Mobility Bed Mobility Overal bed mobility: Needs Assistance Bed Mobility: Sit to Supine       Sit to supine: Supervision (and to make sure pillows are positioned properly for support to RUE)      Transfers Overall transfer level: Needs assistance Equipment used:  (none) Transfers: Sit to/from Stand Sit to Stand: Min guard         General transfer comment: min guard A for ambulation in room as well        ADL                                         General ADL Comments: Pt nor family have any other questions about BADLs due to pt having had her other shoulder done last year                Cognition   Behavior During Therapy: WFL for tasks assessed/performed Overall Cognitive Status: Within Functional Limits for tasks assessed                         Exercises Other Exercises Other Exercises: Supine: Pt preformed 10 reps of forward flexion using her other arm to A (pt got about 20 degrees and is aware to progress to 130 degrees by the time she goes back to see Dr. Tamera Punt); 10 reps of external  rotation using a long shoe horn ( pt able to get to -30 from neutral and is aware she is to progress to 30 degrees past neutral by the time she goes back to see Dr. Tamera Punt). Pt also did 10 reps of AROM elbow flexion/extension in standing. Husband reports he fully understands how to support pt's arm to keep in proper position as she does the exercises herself            Pertinent Vitals/ Pain       Pain Assessment: 0-10 Pain Score: 3  Pain Location: right shoulder with exercises Pain Descriptors / Indicators: Aching Pain Intervention(s): Monitored during session;Repositioned  Home Living Family/patient expects to be discharged to:: Private residence Living Arrangements: Spouse/significant other;Children Available Help at Discharge: Family;Available 24 hours/day Type of Home: House                       Home Equipment: Hand held shower head;Shower seat          Prior Functioning/Environment Level of Independence: Independent            Frequency Min 3X/week     Progress Toward  Goals  OT Goals(current goals can now be found in the care plan section)  Progress towards OT goals: Progressing toward goals  Acute Rehab OT Goals Patient Stated Goal: home tomorrow OT Goal Formulation: With patient Time For Goal Achievement: 12/26/13 Potential to Achieve Goals: Good ADL Goals Pt Will Transfer to Toilet: with supervision;ambulating;bedside commode (over toliet) Pt Will Perform Toileting - Clothing Manipulation and hygiene: with min assist;sit to/from stand Pt/caregiver will Perform Home Exercise Program: Increased ROM;Increased strength;Right Upper extremity;With Supervision;With written HEP provided Additional ADL Goal #1: Pt will be S in and OOB for BADLs  Plan Discharge plan remains appropriate       End of Session Equipment Utilized During Treatment:  (sling)   Activity Tolerance Patient tolerated treatment well   Patient Left in bed;with call bell/phone  within reach;with family/visitor present   Nurse Communication  (I need to see pt at least one more time today and possbily tomorrow)        Time: 0201-0216 OT Time Calculation (min): 15 min  Charges: OT General Charges $OT Visit: 1 Procedure OT Evaluation $Initial OT Evaluation Tier I: 1 Procedure OT Treatments $Therapeutic Exercise: 8-22 mins  Almon Register [phone] 12/23/2013, 2:50 PM

## 2013-12-24 LAB — URINE MICROSCOPIC-ADD ON

## 2013-12-24 LAB — URINALYSIS, ROUTINE W REFLEX MICROSCOPIC
Bilirubin Urine: NEGATIVE
Glucose, UA: NEGATIVE mg/dL
Ketones, ur: NEGATIVE mg/dL
Nitrite: NEGATIVE
Protein, ur: NEGATIVE mg/dL
SPECIFIC GRAVITY, URINE: 1.006 (ref 1.005–1.030)
UROBILINOGEN UA: 0.2 mg/dL (ref 0.0–1.0)
pH: 5 (ref 5.0–8.0)

## 2013-12-24 LAB — CBC WITH DIFFERENTIAL/PLATELET
Basophils Absolute: 0 10*3/uL (ref 0.0–0.1)
Basophils Relative: 0 % (ref 0–1)
EOS ABS: 0.1 10*3/uL (ref 0.0–0.7)
EOS PCT: 1 % (ref 0–5)
HCT: 29.1 % — ABNORMAL LOW (ref 36.0–46.0)
Hemoglobin: 9.9 g/dL — ABNORMAL LOW (ref 12.0–15.0)
Lymphocytes Relative: 20 % (ref 12–46)
Lymphs Abs: 2.5 10*3/uL (ref 0.7–4.0)
MCH: 30.4 pg (ref 26.0–34.0)
MCHC: 34 g/dL (ref 30.0–36.0)
MCV: 89.3 fL (ref 78.0–100.0)
Monocytes Absolute: 1 10*3/uL (ref 0.1–1.0)
Monocytes Relative: 8 % (ref 3–12)
NEUTROS PCT: 71 % (ref 43–77)
Neutro Abs: 9 10*3/uL — ABNORMAL HIGH (ref 1.7–7.7)
Platelets: 295 10*3/uL (ref 150–400)
RBC: 3.26 MIL/uL — ABNORMAL LOW (ref 3.87–5.11)
RDW: 13.3 % (ref 11.5–15.5)
WBC: 12.7 10*3/uL — ABNORMAL HIGH (ref 4.0–10.5)

## 2013-12-24 MED ORDER — SULFAMETHOXAZOLE-TMP DS 800-160 MG PO TABS: 1.0000 | ORAL_TABLET | Freq: Two times a day (BID) | ORAL | Status: DC

## 2013-12-24 NOTE — Progress Notes (Signed)
Discharge instructions and prescriptions reviewed with patient and family. Patient denies questions or concerns. VS stable. IV removed with no complications. Patient concerned about return of temperature after discharge. Encouraged temperature monitoring at home, use of incentive spirometer, and tylenol if needed. Encouraged use of ice pack to decrease swelling. Patient discharged via wheelchair with all personal belongings, discharge packet, and prescriptions.

## 2013-12-24 NOTE — Progress Notes (Signed)
Occupational Therapy Treatment Patient Details Name: KARLISHA MATHENA MRN: [id number] DOB: [birth year] Today's Date: 12/24/2013    History of present illness RTSA   OT comments  Pt demonstrates ability to perform exercises at MOD I level. Pt will have daughter (A) upon d/c home and spouse. Pt reports running low grade fever this AM and feeling hot. Pt returned to supine at end of exercises and reports incr comfort with repositioning.    Follow Up Recommendations  No OT follow up    Equipment Recommendations  None recommended by OT    Recommendations for Other Services      Precautions / Restrictions Precautions Precautions: Shoulder Shoulder Interventions: Shoulder sling/immobilizer;Off for dressing/bathing/exercises Required Braces or Orthoses: Sling Restrictions Weight Bearing Restrictions: Yes RUE Weight Bearing: Non weight bearing       Mobility Bed Mobility Overal bed mobility: Modified Independent                Transfers Overall transfer level: Needs assistance   Transfers: Sit to/from Stand Sit to Stand: Min guard              Balance                                   ADL                                                Vision                     Perception     Praxis      Cognition   Behavior During Therapy: WFL for tasks assessed/performed Overall Cognitive Status: Within Functional Limits for tasks assessed                       Extremity/Trunk Assessment               Exercises Other Exercises Other Exercises: Supine: Pt preformed 10 reps of forward flexion using her other arm to A (pt got about 15 degrees and is aware to progress to 130 degrees by the time she goes back to see Dr. Tamera Punt); 20 reps of external rotation using a long shoe horn ( pt able to get to -30 from neutral and is aware she is to progress to 30 degrees past neutral by the time she goes back to see Dr.  Tamera Punt). Pt also did 10 reps of AROM elbow flexion/extension in sitting. Husband reports he fully understands how to support pt's arm to keep in proper position as she does the exercises herself.Daughter present and completed exercise with patient the PM hours 12/23/13 Donning/doffing shirt without moving shoulder: Modified independent   Shoulder Instructions Shoulder Instructions Donning/doffing shirt without moving shoulder: Modified independent     General Comments      Pertinent Vitals/ Pain       Pain Assessment: No/denies pain  Home Living                                          Prior Functioning/Environment              Frequency Min 3X/week  Progress Toward Goals  OT Goals(current goals can now be found in the care plan section)  Progress towards OT goals: Progressing toward goals  Acute Rehab OT Goals Patient Stated Goal: home tomorrow OT Goal Formulation: With patient Time For Goal Achievement: 12/26/13 Potential to Achieve Goals: Good ADL Goals Pt Will Transfer to Toilet: with supervision;ambulating;bedside commode Pt Will Perform Toileting - Clothing Manipulation and hygiene: with min assist;sit to/from stand Pt/caregiver will Perform Home Exercise Program: Increased ROM;Increased strength;Right Upper extremity;With Supervision;With written HEP provided Additional ADL Goal #1: Pt will be S in and OOB for BADLs  Plan Discharge plan remains appropriate    Co-evaluation                 End of Session     Activity Tolerance Patient tolerated treatment well   Patient Left in bed;with call bell/phone within reach;with bed alarm set   Nurse Communication Mobility status;Precautions        Time: 1030-1314 OT Time Calculation (min): 12 min  Charges: OT General Charges $OT Visit: 1 Procedure OT Treatments $Therapeutic Exercise: 8-22 mins  Parke Poisson B 12/24/2013, 10:09 AM Pager: [phone]

## 2013-12-24 NOTE — Progress Notes (Addendum)
PATIENT ID: BRAELIN BROSCH  MRN: [id number]  DOB/AGE:  August 29, 1945 / 68 y.o.  2 Days Post-Op Procedure(s) (LRB): TOTAL SHOULDER ARTHROPLASTY (Right)    PROGRESS NOTE Subjective:   Patient is alert, oriented, no Nausea, no Vomiting, no passing gas, no Bowel Movement. Taking PO ok. Denies SOB, Chest or Calf Pain. Using Incentive Spirometer, PAS in place. , Patient reports pain as moderate,     Fever up to 100.9 with some urinary symptoms including burning.  Objective: Vital signs in last 24 hours: Temp:  [97.5 F (36.4 C)-98.7 F (37.1 C)] 98.7 F (37.1 C) (10/24 0617) Pulse Rate:  [66-91] 91 (10/24 0617) Resp:  [18] 18 (10/24 0617) BP: (93-127)/(53-62) 127/61 mmHg (10/24 0617) SpO2:  [95 %-97 %] 97 % (10/24 0617)    Intake/Output from previous day: I/O last 3 completed shifts: In: 1790 [P.O.:1790] Out: 1 [Urine:1]   Intake/Output this shift:     LABORATORY DATA:  Recent Labs  12/23/13 0545  WBC 13.6*  HGB 11.0*  HCT 33.5*  PLT 283  NA 139  K 3.5*  CL 99  CO2 25  BUN 12  CREATININE 1.12*  GLUCOSE 134*  CALCIUM 8.2*    Examination: Neurologically intact Neurovascular intact Sensation intact distally Intact pulses distally Incision: scant drainage} Pt de shave some mild warmth and swelling. Assessment:   2 Days Post-Op Procedure(s) (LRB): TOTAL SHOULDER ARTHROPLASTY (Right) ADDITIONAL DIAGNOSIS:  Hypertension  Fever - clean catch u/a, will await the results.  UTI -  Pt started on bactrim D/S BID x 5 days.  Plan: U/A will await results to treat.  DISCHARGE PLAN: Home OT today- passive ROM limited to 130FF, 30ER  Medications changed last night to reflect baseline home meds with additional oxycodone 5-10mg  and IV morphine for breakthough, improving this am  Okay for d/c today when pain under control on oral meds and cleared by OT, patient will manage home pain with her pain mgmt physician and we will prescribe robaxin in addition. Okay if she needs to stay  another night for pain control but hopefully can d/c today  Pt's U/A did come back with bacteria, pt was started on Bactrim D/S BID x 5 days.  She states that this has worked well for her in the past.     Douglass Dunshee R 12/24/2013, 8:30 AM

## 2013-12-24 NOTE — Discharge Summary (Signed)
Patient ID: ENIJAH FURR MRN: [id number] DOB/AGE: [birth year] 68 y.o.  Admit date: 12/22/2013 Discharge date: 12/24/2013  Admission Diagnoses:  Active Problems:   Arthritis of shoulder region, right, degenerative   Discharge Diagnoses:  Same  Past Medical History  Diagnosis Date  . Fibromyalgia   . Chronic pain syndrome   . Hx of colonic polyps   . Hypertension   . Obesity   . Chronic UTI   . Allergy   . Hypothyroidism   . Pre-diabetes     "barely" (12/22/2013)  . Arthritis     "all over"    Surgeries: Procedure(s): TOTAL SHOULDER ARTHROPLASTY on 12/22/2013   Consultants:    Discharged Condition: Improved  Hospital Course: OCEAN KEARLEY is an 68 y.o. female who was admitted 12/22/2013 for operative treatment of<principal problem not specified>. Patient has severe unremitting pain that affects sleep, daily activities, and work/hobbies. After pre-op clearance the patient was taken to the operating room on 12/22/2013 and underwent  Procedure(s): TOTAL SHOULDER ARTHROPLASTY.    Patient was given perioperative antibiotics: Anti-infectives   Start     Dose/Rate Route Frequency Ordered Stop   12/24/13 0000  sulfamethoxazole-trimethoprim (BACTRIM DS) 800-160 MG per tablet     1 tablet Oral 2 times daily 12/24/13 1502     12/22/13 1500  ceFAZolin (ANCEF) IVPB 2 g/50 mL premix     2 g 100 mL/hr over 30 Minutes Intravenous Every 6 hours 12/22/13 1325 12/23/13 0239   12/22/13 0600  ceFAZolin (ANCEF) IVPB 2 g/50 mL premix     2 g 100 mL/hr over 30 Minutes Intravenous On call to O.R. 12/21/13 1418 12/22/13 0745       Patient was given sequential compression devices, early ambulation, and chemoprophylaxis to prevent DVT.  Patient benefited maximally from hospital stay and there were no complications.    Recent vital signs: Patient Vitals for the past 24 hrs:  BP Temp Temp src Pulse Resp SpO2 Weight  12/24/13 1100 - - - - - - 108.863 kg (240 lb)  12/24/13 1030 - 98.7 F  (37.1 C) - - - - -  12/24/13 0805 - 100.9 F (38.3 C) - - - - -  12/24/13 0617 127/61 mmHg 98.7 F (37.1 C) Oral 91 18 97 % -  12/23/13 2046 112/62 mmHg 98.4 F (36.9 C) Oral 81 18 96 % -     Recent laboratory studies:  Recent Labs  12/23/13 0545 12/24/13 1356  WBC 13.6* 12.7*  HGB 11.0* 9.9*  HCT 33.5* 29.1*  PLT 283 295  NA 139  --   K 3.5*  --   CL 99  --   CO2 25  --   BUN 12  --   CREATININE 1.12*  --   GLUCOSE 134*  --   CALCIUM 8.2*  --      Discharge Medications:     Medication List         BIOTIN 5000 PO  Take 5,000 Units by mouth 2 (two) times a week.     Diethylpropion HCl 25 MG Tabs  Take 75 mg by mouth daily.     Magnesium 250 MG Tabs  Take 250 mg by mouth 2 (two) times daily.     methocarbamol 500 MG tablet  Commonly known as:  ROBAXIN  Take 1 tablet (500 mg total) by mouth every 6 (six) hours as needed for muscle spasms.     MULTIVITAMIN PO  Take 1 tablet by mouth daily.  OVER THE COUNTER MEDICATION  Take 50 mg by mouth 2 (two) times a week. DHEA 50 mg     OxyCODONE 20 mg T12a 12 hr tablet  Commonly known as:  OXYCONTIN  Take 1 tablet (20 mg total) by mouth every 8 (eight) hours.     oxyCODONE-acetaminophen 10-325 MG per tablet  Commonly known as:  PERCOCET  Take 1 tablet by mouth 3 (three) times daily.     sulfamethoxazole-trimethoprim 800-160 MG per tablet  Commonly known as:  BACTRIM DS  Take 1 tablet by mouth 2 (two) times daily.     telmisartan 40 MG tablet  Commonly known as:  MICARDIS  Take 20 mg by mouth daily.     thyroid 60 MG tablet  Commonly known as:  ARMOUR  Take 120 mg by mouth daily before breakfast.     VITAMIN C PO  Take 1 tablet by mouth daily.     Vitamin D (Ergocalciferol) 50000 UNITS Caps capsule  Commonly known as:  DRISDOL  Take 50,000 Units by mouth every 7 (seven) days.     VITAMIN-B COMPLEX PO  Take 1 tablet by mouth daily.        Diagnostic Studies: Dg Chest 2 View  12/14/2013    CLINICAL DATA:  68 year old female with no current chest complaints. Preoperative evaluation prior to right shoulder total arthroplasty.  EXAM: CHEST  2 VIEW  COMPARISON:  Chest x-ray 11/19/2012.  FINDINGS: Lung volumes are normal. No consolidative airspace disease. No pleural effusions. No pneumothorax. No pulmonary nodule or mass noted. Pulmonary vasculature and the cardiomediastinal silhouette are within normal limits. Postoperative changes of left shoulder arthroplasty are incompletely visualized.  IMPRESSION: 1.  No radiographic evidence of acute cardiopulmonary disease.   Electronically Signed   By: Vinnie Langton M.D.   On: 12/14/2013 16:01   Ct Shoulder Right Wo Contrast  12/07/2013   CLINICAL DATA:  Osteoarthritis of the right shoulder with decreased range of motion. M19.019.  EXAM: CT OF THE RIGHT SHOULDER WITHOUT CONTRAST  TECHNIQUE: Multidetector CT imaging was performed according to the standard protocol. Multiplanar CT image reconstructions were also generated.  COMPARISON:  None.  FINDINGS: There is severe osteoarthritis of the glenohumeral joint with diffuse full-thickness cartilage loss and subchondral cyst formation in the glenoid and humeral head with prominent marginal osteophytes on the humeral head. There is a large joint effusion. There is extensive fluid and what is probably debris in the bicipital tendon sheath.  The long head of the biceps tendon is not discretely identified and may be torn.  Minimal degenerative changes of the acromioclavicular joint. The rotator cuff appears to be intact. No visible bursitis. No atrophy of the muscles of the rotator cuff.  IMPRESSION: 1. Severe osteoarthritis of the glenohumeral joint with a prominent joint effusion. 2. Probable avulsion and distal retraction of the long head of the biceps tendon. 3. Intact rotator cuff.   Electronically Signed   By: Rozetta Nunnery M.D.   On: 12/07/2013 14:32   Dg Shoulder Right Port  12/22/2013   CLINICAL DATA:   Post shoulder replacement surgery RIGHT  EXAM: PORTABLE RIGHT SHOULDER - 2+ VIEW  COMPARISON:  Portable exam 1008 hr compared to CT of 12/07/2013  FINDINGS: Single AP view.  Osseous demineralization.  New RIGHT shoulder prosthesis without gross evidence of fracture dislocation on single AP view.  AC joint alignment normal.  IMPRESSION: RIGHT shoulder prosthesis without acute complication.   Electronically Signed   By: Crist Infante.D.  On: 12/22/2013 10:23    Disposition: 01-Home or Self Care      Discharge Instructions   Call MD / Call 911    Complete by:  As directed   If you experience chest pain or shortness of breath, CALL 911 and be transported to the hospital emergency room.  If you develope a fever above 101 F, pus (white drainage) or increased drainage or redness at the wound, or calf pain, call your surgeon's office.     Constipation Prevention    Complete by:  As directed   Drink plenty of fluids.  Prune juice may be helpful.  You may use a stool softener, such as Colace (over the counter) 100 mg twice a day.  Use MiraLax (over the counter) for constipation as needed.     Diet - low sodium heart healthy    Complete by:  As directed      Discharge instructions    Complete by:  As directed   Follow up with Dr. Tamera Punt as previsuosly discussed     Driving restrictions    Complete by:  As directed   No driving for 2 weeks     Increase activity slowly as tolerated    Complete by:  As directed            Follow-up Information   Follow up with Nita Sells, MD. Schedule an appointment as soon as possible for a visit in 2 weeks.   Specialty:  Orthopedic Surgery   Contact information:   [address] [place] [address] [phone]        Signed: Hardin Negus, Sanaia Jasso R 12/24/2013, 3:03 PM

## 2014-01-02 ENCOUNTER — Encounter (HOSPITAL_COMMUNITY): Payer: Self-pay | Admitting: Orthopedic Surgery

## 2014-01-04 DIAGNOSIS — Z96611 Presence of right artificial shoulder joint: Secondary | ICD-10-CM | POA: Diagnosis not present

## 2014-01-04 DIAGNOSIS — Z471 Aftercare following joint replacement surgery: Secondary | ICD-10-CM | POA: Diagnosis not present

## 2014-01-06 DIAGNOSIS — N644 Mastodynia: Secondary | ICD-10-CM | POA: Diagnosis not present

## 2014-01-09 DIAGNOSIS — M25511 Pain in right shoulder: Secondary | ICD-10-CM | POA: Diagnosis not present

## 2014-01-09 DIAGNOSIS — M19011 Primary osteoarthritis, right shoulder: Secondary | ICD-10-CM | POA: Diagnosis not present

## 2014-01-09 DIAGNOSIS — G894 Chronic pain syndrome: Secondary | ICD-10-CM | POA: Diagnosis not present

## 2014-01-09 DIAGNOSIS — M150 Primary generalized (osteo)arthritis: Secondary | ICD-10-CM | POA: Diagnosis not present

## 2014-01-12 DIAGNOSIS — I10 Essential (primary) hypertension: Secondary | ICD-10-CM | POA: Diagnosis not present

## 2014-01-12 DIAGNOSIS — M542 Cervicalgia: Secondary | ICD-10-CM | POA: Diagnosis not present

## 2014-01-12 DIAGNOSIS — D649 Anemia, unspecified: Secondary | ICD-10-CM | POA: Diagnosis not present

## 2014-02-01 DIAGNOSIS — Z471 Aftercare following joint replacement surgery: Secondary | ICD-10-CM | POA: Diagnosis not present

## 2014-02-01 DIAGNOSIS — Z96611 Presence of right artificial shoulder joint: Secondary | ICD-10-CM | POA: Diagnosis not present

## 2014-02-07 DIAGNOSIS — M150 Primary generalized (osteo)arthritis: Secondary | ICD-10-CM | POA: Diagnosis not present

## 2014-02-07 DIAGNOSIS — Z79891 Long term (current) use of opiate analgesic: Secondary | ICD-10-CM | POA: Diagnosis not present

## 2014-02-07 DIAGNOSIS — M25511 Pain in right shoulder: Secondary | ICD-10-CM | POA: Diagnosis not present

## 2014-02-07 DIAGNOSIS — G894 Chronic pain syndrome: Secondary | ICD-10-CM | POA: Diagnosis not present

## 2014-02-09 ENCOUNTER — Ambulatory Visit (INDEPENDENT_AMBULATORY_CARE_PROVIDER_SITE_OTHER): Payer: Medicare Other | Admitting: Physical Therapy

## 2014-02-09 DIAGNOSIS — M25619 Stiffness of unspecified shoulder, not elsewhere classified: Secondary | ICD-10-CM

## 2014-02-09 DIAGNOSIS — M6281 Muscle weakness (generalized): Secondary | ICD-10-CM

## 2014-02-10 ENCOUNTER — Encounter (INDEPENDENT_AMBULATORY_CARE_PROVIDER_SITE_OTHER): Payer: Medicare Other | Admitting: Physical Therapy

## 2014-02-10 DIAGNOSIS — M6281 Muscle weakness (generalized): Secondary | ICD-10-CM

## 2014-02-10 DIAGNOSIS — M25619 Stiffness of unspecified shoulder, not elsewhere classified: Secondary | ICD-10-CM

## 2014-02-13 ENCOUNTER — Encounter (INDEPENDENT_AMBULATORY_CARE_PROVIDER_SITE_OTHER): Payer: Medicare Other | Admitting: Physical Therapy

## 2014-02-13 DIAGNOSIS — M25619 Stiffness of unspecified shoulder, not elsewhere classified: Secondary | ICD-10-CM

## 2014-02-13 DIAGNOSIS — M6281 Muscle weakness (generalized): Secondary | ICD-10-CM

## 2014-02-15 ENCOUNTER — Encounter: Payer: Medicare Other | Admitting: Physical Therapy

## 2014-03-01 ENCOUNTER — Encounter: Payer: Medicare Other | Admitting: Physical Therapy

## 2014-03-06 ENCOUNTER — Encounter (INDEPENDENT_AMBULATORY_CARE_PROVIDER_SITE_OTHER): Payer: Medicare Other | Admitting: Physical Therapy

## 2014-03-06 DIAGNOSIS — M6281 Muscle weakness (generalized): Secondary | ICD-10-CM

## 2014-03-06 DIAGNOSIS — M25619 Stiffness of unspecified shoulder, not elsewhere classified: Secondary | ICD-10-CM

## 2014-03-07 ENCOUNTER — Encounter: Payer: Medicare Other | Admitting: Physical Therapy

## 2014-03-07 DIAGNOSIS — D649 Anemia, unspecified: Secondary | ICD-10-CM | POA: Diagnosis not present

## 2014-03-07 DIAGNOSIS — I10 Essential (primary) hypertension: Secondary | ICD-10-CM | POA: Diagnosis not present

## 2014-03-07 DIAGNOSIS — R300 Dysuria: Secondary | ICD-10-CM | POA: Diagnosis not present

## 2014-03-07 DIAGNOSIS — E039 Hypothyroidism, unspecified: Secondary | ICD-10-CM | POA: Diagnosis not present

## 2014-03-07 DIAGNOSIS — R21 Rash and other nonspecific skin eruption: Secondary | ICD-10-CM | POA: Diagnosis not present

## 2014-03-07 DIAGNOSIS — R5381 Other malaise: Secondary | ICD-10-CM | POA: Diagnosis not present

## 2014-03-09 ENCOUNTER — Encounter: Payer: Medicare Other | Admitting: Physical Therapy

## 2014-03-09 DIAGNOSIS — M150 Primary generalized (osteo)arthritis: Secondary | ICD-10-CM | POA: Diagnosis not present

## 2014-03-09 DIAGNOSIS — Z79891 Long term (current) use of opiate analgesic: Secondary | ICD-10-CM | POA: Diagnosis not present

## 2014-03-09 DIAGNOSIS — G894 Chronic pain syndrome: Secondary | ICD-10-CM | POA: Diagnosis not present

## 2014-03-09 DIAGNOSIS — M25511 Pain in right shoulder: Secondary | ICD-10-CM | POA: Diagnosis not present

## 2014-03-12 ENCOUNTER — Emergency Department (INDEPENDENT_AMBULATORY_CARE_PROVIDER_SITE_OTHER)
Admission: EM | Admit: 2014-03-12 | Discharge: 2014-03-12 | Disposition: A | Discharge status: Home / Self Care | Payer: Medicare Other | Source: Home / Self Care | Attending: Family Medicine | Admitting: Family Medicine

## 2014-03-12 DIAGNOSIS — N3001 Acute cystitis with hematuria: Secondary | ICD-10-CM

## 2014-03-12 DIAGNOSIS — N390 Urinary tract infection, site not specified: Secondary | ICD-10-CM | POA: Diagnosis not present

## 2014-03-12 LAB — POCT URINALYSIS DIP (MANUAL ENTRY)
Bilirubin, UA: NEGATIVE
Glucose, UA: NEGATIVE
Ketones, POC UA: NEGATIVE
Nitrite, UA: NEGATIVE
Protein Ur, POC: NEGATIVE
Spec Grav, UA: 1.02 (ref 1.005–1.03)
Urobilinogen, UA: 0.2 (ref 0–1)
pH, UA: 5.5 (ref 5–8)

## 2014-03-12 MED ORDER — CEPHALEXIN 250 MG PO CAPS: 250.0000 mg | ORAL_CAPSULE | Freq: Three times a day (TID) | ORAL | Status: DC

## 2014-03-12 NOTE — ED Provider Notes (Signed)
Sally ODONNEL is a 69 y.o. female who presents to Urgent Care today for urinary frequency urgency and dysuria. Symptoms are present for 1 week. No fever or chills or nausea vomiting or diarrhea. AZO helps. Symptoms are c/w with prior UTI.    Past Medical History  Diagnosis Date  . Fibromyalgia   . Chronic pain syndrome   . Hx of colonic polyps   . Hypertension   . Obesity   . Chronic UTI   . Allergy   . Hypothyroidism   . Pre-diabetes     "barely" (12/22/2013)  . Arthritis     "all over"   Past Surgical History  Procedure Laterality Date  . Cesarean section  1980; 1985  . Cholecystectomy  1982  . Tonsillectomy  1959  . Foot surgery  1992  . Knee arthroscopy Right 1992  . Appendectomy  1965  . Abdominal hysterectomy  1990  . Total shoulder arthroplasty Left 11/25/2012    Procedure: TOTAL SHOULDER ARTHROPLASTY;  Surgeon: Nita Sells, MD;  Location: [facility];  Service: Orthopedics;  Laterality: Left;  LEFT TOTAL SHOULDER ARTHROPLASTY  . Total shoulder arthroplasty Right 12/22/2013  . Dilation and curettage of uterus  1990  . Tubal ligation  1985  . Total shoulder arthroplasty Right 12/22/2013    Procedure: TOTAL SHOULDER ARTHROPLASTY;  Surgeon: Nita Sells, MD;  Location: [facility];  Service: Orthopedics;  Laterality: Right;  Right shoulder total arthroplasty   History  Substance Use Topics  . Smoking status: Former Smoker -- 1.00 packs/day for 7 years    Types: Cigarettes    Quit date: 03/03/1970  . Smokeless tobacco: Never Used  . Alcohol Use: No   ROS as above Medications: No current facility-administered medications for this encounter.   Current Outpatient Prescriptions  Medication Sig Dispense Refill  . Ascorbic Acid (VITAMIN C PO) Take 1 tablet by mouth daily.    . B Complex Vitamins (VITAMIN-B COMPLEX PO) Take 1 tablet by mouth daily.    Marland Kitchen BIOTIN 5000 PO Take 5,000 Units by mouth 2 (two) times a week.     . cephALEXin (KEFLEX) 250 MG capsule Take  1 capsule (250 mg total) by mouth 3 (three) times daily. 21 capsule 0  . Diethylpropion HCl 25 MG TABS Take 75 mg by mouth daily.    . Magnesium 250 MG TABS Take 250 mg by mouth 2 (two) times daily.    . methocarbamol (ROBAXIN) 500 MG tablet Take 1 tablet (500 mg total) by mouth every 6 (six) hours as needed for muscle spasms. 30 tablet 0  . Multiple Vitamins-Minerals (MULTIVITAMIN PO) Take 1 tablet by mouth daily.    Marland Kitchen OVER THE COUNTER MEDICATION Take 50 mg by mouth 2 (two) times a week. DHEA 50 mg    . OxyCODONE (OXYCONTIN) 20 mg T12A 12 hr tablet Take 1 tablet (20 mg total) by mouth every 8 (eight) hours. 60 tablet 0  . oxyCODONE-acetaminophen (PERCOCET) 10-325 MG per tablet Take 1 tablet by mouth 3 (three) times daily.     Marland Kitchen sulfamethoxazole-trimethoprim (BACTRIM DS) 800-160 MG per tablet Take 1 tablet by mouth 2 (two) times daily. 10 tablet 0  . telmisartan (MICARDIS) 40 MG tablet Take 20 mg by mouth daily.     Marland Kitchen thyroid (ARMOUR) 60 MG tablet Take 120 mg by mouth daily before breakfast.    . Vitamin D, Ergocalciferol, (DRISDOL) 50000 UNITS CAPS capsule Take 50,000 Units by mouth every 7 (seven) days.  Allergies  Allergen Reactions  . Bactrim [Sulfamethoxazole-Trimethoprim]   . Codeine Nausea Only  . Doxycycline Nausea Only     Exam:  BP 154/91 mmHg  Pulse 70  Temp(Src) 98.4 F (36.9 C) (Oral)  Ht 5\' 7"  (1.702 m)  Wt 243 lb 4 oz (110.337 kg)  BMI 38.09 kg/m2  SpO2 95% Gen: Well NAD HEENT: EOMI,  MMM Lungs: Normal work of breathing. CTABL Heart: RRR no MRG Abd: NABS, Soft. Nondistended, Nontender no cv angle ttp. Exts: Brisk capillary refill, warm and well perfused.   Results for orders placed or performed during the hospital encounter of 03/12/14 (from the past 24 hour(s))  POCT urinalysis dipstick     Status: None   Collection Time: 03/12/14  2:11 PM  Result Value Ref Range   Color, UA yellow    Clarity, UA clear    Glucose, UA neg    Bilirubin, UA negative     Bilirubin, UA negative    Spec Grav, UA 1.020 1.005 - 1.03   Blood, UA trace-lysed    pH, UA 5.5 5 - 8   Protein Ur, POC negative    Urobilinogen, UA 0.2 0 - 1   Nitrite, UA Negative    Leukocytes, UA Trace    No results found.  Assessment and Plan: 69 y.o. female with UTI. Tx keflex. Culture pending. F/u PRN>   Discussed warning signs or symptoms. Please see discharge instructions. Patient expresses understanding.     Gregor Hams, MD 03/12/14 [phone]

## 2014-03-12 NOTE — Discharge Instructions (Signed)
Thank you for coming in today. If your belly pain worsens, or you have high fever, bad vomiting, blood in your stool or black tarry stool go to the Emergency Room.   Urinary Tract Infection Urinary tract infections (UTIs) can develop anywhere along your urinary tract. Your urinary tract is your body's drainage system for removing wastes and extra water. Your urinary tract includes two kidneys, two ureters, a bladder, and a urethra. Your kidneys are a pair of bean-shaped organs. Each kidney is about the size of your fist. They are located below your ribs, one on each side of your spine. CAUSES Infections are caused by microbes, which are microscopic organisms, including fungi, viruses, and bacteria. These organisms are so small that they can only be seen through a microscope. Bacteria are the microbes that most commonly cause UTIs. SYMPTOMS  Symptoms of UTIs may vary by age and gender of the patient and by the location of the infection. Symptoms in young women typically include a frequent and intense urge to urinate and a painful, burning feeling in the bladder or urethra during urination. Older women and men are more likely to be tired, shaky, and weak and have muscle aches and abdominal pain. A fever may mean the infection is in your kidneys. Other symptoms of a kidney infection include pain in your back or sides below the ribs, nausea, and vomiting. DIAGNOSIS To diagnose a UTI, your caregiver will ask you about your symptoms. Your caregiver also will ask to provide a urine sample. The urine sample will be tested for bacteria and white blood cells. White blood cells are made by your body to help fight infection. TREATMENT  Typically, UTIs can be treated with medication. Because most UTIs are caused by a bacterial infection, they usually can be treated with the use of antibiotics. The choice of antibiotic and length of treatment depend on your symptoms and the type of bacteria causing your  infection. HOME CARE INSTRUCTIONS  If you were prescribed antibiotics, take them exactly as your caregiver instructs you. Finish the medication even if you feel better after you have only taken some of the medication.  Drink enough water and fluids to keep your urine clear or pale yellow.  Avoid caffeine, tea, and carbonated beverages. They tend to irritate your bladder.  Empty your bladder often. Avoid holding urine for long periods of time.  Empty your bladder before and after sexual intercourse.  After a bowel movement, women should cleanse from front to back. Use each tissue only once. SEEK MEDICAL CARE IF:   You have back pain.  You develop a fever.  Your symptoms do not begin to resolve within 3 days. SEEK IMMEDIATE MEDICAL CARE IF:   You have severe back pain or lower abdominal pain.  You develop chills.  You have nausea or vomiting.  You have continued burning or discomfort with urination. MAKE SURE YOU:   Understand these instructions.  Will watch your condition.  Will get help right away if you are not doing well or get worse. Document Released: 11/27/2004 Document Revised: 08/19/2011 Document Reviewed: 03/28/2011 Exit[employer] Patient Information 2015 [employer]. This information is not intended to replace advice given to you by your health care provider. Make sure you discuss any questions you have with your health care provider.  

## 2014-03-12 NOTE — ED Notes (Signed)
Urinary frequency, burning and lower back pain started last week and increasingly getting worse.

## 2014-03-13 ENCOUNTER — Encounter: Payer: Medicare Other | Admitting: Physical Therapy

## 2014-03-14 LAB — URINE CULTURE

## 2014-03-15 ENCOUNTER — Telehealth: Payer: Self-pay | Admitting: *Deleted

## 2014-03-16 ENCOUNTER — Encounter: Payer: Medicare Other | Admitting: Physical Therapy

## 2014-03-29 DIAGNOSIS — E039 Hypothyroidism, unspecified: Secondary | ICD-10-CM | POA: Diagnosis not present

## 2014-03-29 DIAGNOSIS — R5383 Other fatigue: Secondary | ICD-10-CM | POA: Diagnosis not present

## 2014-03-29 DIAGNOSIS — Z79899 Other long term (current) drug therapy: Secondary | ICD-10-CM | POA: Diagnosis not present

## 2014-03-29 DIAGNOSIS — R0602 Shortness of breath: Secondary | ICD-10-CM | POA: Diagnosis not present

## 2014-03-29 DIAGNOSIS — R635 Abnormal weight gain: Secondary | ICD-10-CM | POA: Diagnosis not present

## 2014-03-29 DIAGNOSIS — R5381 Other malaise: Secondary | ICD-10-CM | POA: Diagnosis not present

## 2014-04-10 DIAGNOSIS — Z79891 Long term (current) use of opiate analgesic: Secondary | ICD-10-CM | POA: Diagnosis not present

## 2014-04-10 DIAGNOSIS — M150 Primary generalized (osteo)arthritis: Secondary | ICD-10-CM | POA: Diagnosis not present

## 2014-04-10 DIAGNOSIS — G894 Chronic pain syndrome: Secondary | ICD-10-CM | POA: Diagnosis not present

## 2014-04-12 DIAGNOSIS — Z09 Encounter for follow-up examination after completed treatment for conditions other than malignant neoplasm: Secondary | ICD-10-CM | POA: Diagnosis not present

## 2014-04-20 DIAGNOSIS — E039 Hypothyroidism, unspecified: Secondary | ICD-10-CM | POA: Diagnosis not present

## 2014-04-28 DIAGNOSIS — Z79891 Long term (current) use of opiate analgesic: Secondary | ICD-10-CM | POA: Diagnosis not present

## 2014-04-28 DIAGNOSIS — M150 Primary generalized (osteo)arthritis: Secondary | ICD-10-CM | POA: Diagnosis not present

## 2014-04-28 DIAGNOSIS — G894 Chronic pain syndrome: Secondary | ICD-10-CM | POA: Diagnosis not present

## 2014-05-08 DIAGNOSIS — Z6837 Body mass index (BMI) 37.0-37.9, adult: Secondary | ICD-10-CM | POA: Diagnosis not present

## 2014-05-08 DIAGNOSIS — E669 Obesity, unspecified: Secondary | ICD-10-CM | POA: Diagnosis not present

## 2014-05-08 DIAGNOSIS — Z713 Dietary counseling and surveillance: Secondary | ICD-10-CM | POA: Diagnosis not present

## 2014-05-08 DIAGNOSIS — E559 Vitamin D deficiency, unspecified: Secondary | ICD-10-CM | POA: Diagnosis not present

## 2014-05-08 DIAGNOSIS — Z01419 Encounter for gynecological examination (general) (routine) without abnormal findings: Secondary | ICD-10-CM | POA: Diagnosis not present

## 2014-05-08 DIAGNOSIS — Z1231 Encounter for screening mammogram for malignant neoplasm of breast: Secondary | ICD-10-CM | POA: Diagnosis not present

## 2014-05-15 DIAGNOSIS — N390 Urinary tract infection, site not specified: Secondary | ICD-10-CM | POA: Diagnosis not present

## 2014-05-24 DIAGNOSIS — M150 Primary generalized (osteo)arthritis: Secondary | ICD-10-CM | POA: Diagnosis not present

## 2014-05-24 DIAGNOSIS — G894 Chronic pain syndrome: Secondary | ICD-10-CM | POA: Diagnosis not present

## 2014-05-24 DIAGNOSIS — Z79891 Long term (current) use of opiate analgesic: Secondary | ICD-10-CM | POA: Diagnosis not present

## 2014-06-12 DIAGNOSIS — E039 Hypothyroidism, unspecified: Secondary | ICD-10-CM | POA: Diagnosis not present

## 2014-06-12 DIAGNOSIS — L309 Dermatitis, unspecified: Secondary | ICD-10-CM | POA: Diagnosis not present

## 2014-06-12 DIAGNOSIS — R300 Dysuria: Secondary | ICD-10-CM | POA: Diagnosis not present

## 2014-06-12 DIAGNOSIS — L821 Other seborrheic keratosis: Secondary | ICD-10-CM | POA: Diagnosis not present

## 2014-06-12 DIAGNOSIS — R7309 Other abnormal glucose: Secondary | ICD-10-CM | POA: Diagnosis not present

## 2014-06-12 DIAGNOSIS — R5383 Other fatigue: Secondary | ICD-10-CM | POA: Diagnosis not present

## 2014-06-12 DIAGNOSIS — L720 Epidermal cyst: Secondary | ICD-10-CM | POA: Diagnosis not present

## 2014-06-20 DIAGNOSIS — E039 Hypothyroidism, unspecified: Secondary | ICD-10-CM | POA: Diagnosis not present

## 2014-06-20 DIAGNOSIS — G894 Chronic pain syndrome: Secondary | ICD-10-CM | POA: Diagnosis not present

## 2014-06-20 DIAGNOSIS — E669 Obesity, unspecified: Secondary | ICD-10-CM | POA: Diagnosis not present

## 2014-06-20 DIAGNOSIS — M150 Primary generalized (osteo)arthritis: Secondary | ICD-10-CM | POA: Diagnosis not present

## 2014-06-20 DIAGNOSIS — Z713 Dietary counseling and surveillance: Secondary | ICD-10-CM | POA: Diagnosis not present

## 2014-06-20 DIAGNOSIS — Z79891 Long term (current) use of opiate analgesic: Secondary | ICD-10-CM | POA: Diagnosis not present

## 2014-06-30 DIAGNOSIS — M25512 Pain in left shoulder: Secondary | ICD-10-CM | POA: Diagnosis not present

## 2014-07-17 ENCOUNTER — Ambulatory Visit: Payer: Federal, State, Local not specified - PPO | Admitting: Rehabilitative and Restorative Service Providers"

## 2014-07-18 DIAGNOSIS — G894 Chronic pain syndrome: Secondary | ICD-10-CM | POA: Diagnosis not present

## 2014-07-18 DIAGNOSIS — M150 Primary generalized (osteo)arthritis: Secondary | ICD-10-CM | POA: Diagnosis not present

## 2014-07-18 DIAGNOSIS — Z79891 Long term (current) use of opiate analgesic: Secondary | ICD-10-CM | POA: Diagnosis not present

## 2014-07-19 DIAGNOSIS — H8110 Benign paroxysmal vertigo, unspecified ear: Secondary | ICD-10-CM | POA: Diagnosis not present

## 2014-07-29 ENCOUNTER — Emergency Department (INDEPENDENT_AMBULATORY_CARE_PROVIDER_SITE_OTHER)
Admission: EM | Admit: 2014-07-29 | Discharge: 2014-07-29 | Disposition: A | Discharge status: Home / Self Care | Payer: Medicare Other | Source: Home / Self Care | Attending: Emergency Medicine | Admitting: Emergency Medicine

## 2014-07-29 ENCOUNTER — Encounter: Payer: Self-pay | Admitting: Emergency Medicine

## 2014-07-29 DIAGNOSIS — N3001 Acute cystitis with hematuria: Secondary | ICD-10-CM

## 2014-07-29 LAB — POCT URINALYSIS DIP (MANUAL ENTRY)
Bilirubin, UA: NEGATIVE
Glucose, UA: 100
Ketones, POC UA: NEGATIVE
Nitrite, UA: POSITIVE
Spec Grav, UA: 1.02 (ref 1.005–1.03)
Urobilinogen, UA: 0.2 (ref 0–1)
pH, UA: 5.5 (ref 5–8)

## 2014-07-29 MED ORDER — PHENAZOPYRIDINE HCL 200 MG PO TABS: ORAL_TABLET | ORAL | Status: AC

## 2014-07-29 MED ORDER — SULFAMETHOXAZOLE-TRIMETHOPRIM 800-160 MG PO TABS: 1.0000 | ORAL_TABLET | Freq: Two times a day (BID) | ORAL | Status: AC

## 2014-07-29 NOTE — ED Notes (Signed)
Patient presents to the [facility] with C/O painful voiding, rates pain 8/10.

## 2014-07-29 NOTE — ED Provider Notes (Signed)
CSN: [id number]     Arrival date & time 07/29/14  1428 History   First MD Initiated Contact with Patient 07/29/14 [facility]    Saturday HPI This is a 69 y.o. female who presents today with UTI symptoms for 4 days.  + Severe burning dysuria + frequency + urgency Mild hematuria No vaginal discharge Positive for low grade fever. No sweats or chills Has minimal suprapubic lower abdominal pain. No other abdominal pain or flank pain. No nausea No vomiting No back pain No fatigue She denies chance of pregnancy. Has tried over-the-counter measures without improvement.  She describes history of chronic recurrent UTIs, and is under the care of her urologist is done workup over the years. She states her most recent UTI was 2 months ago, evaluated and treated by her urologist with Bactrim DS which resolved the urinary symptoms and did not recur until 4 days ago.  She requests a prescription for Bactrim DS, and she understands she'll follow-up with her urologist within the next week.    Past Medical History  Diagnosis Date  . Fibromyalgia   . Chronic pain syndrome   . Hx of colonic polyps   . Hypertension   . Obesity   . Chronic UTI   . Allergy   . Hypothyroidism   . Pre-diabetes     "barely" (12/22/2013)  . Arthritis     "all over"   Past Surgical History  Procedure Laterality Date  . Cesarean section  1980; 1985  . Cholecystectomy  1982  . Tonsillectomy  1959  . Foot surgery  1992  . Knee arthroscopy Right 1992  . Appendectomy  1965  . Abdominal hysterectomy  1990  . Total shoulder arthroplasty Left 11/25/2012    Procedure: TOTAL SHOULDER ARTHROPLASTY;  Surgeon: Nita Sells, MD;  Location: [place];  Service: Orthopedics;  Laterality: Left;  LEFT TOTAL SHOULDER ARTHROPLASTY  . Total shoulder arthroplasty Right 12/22/2013  . Dilation and curettage of uterus  1990  . Tubal ligation  1985  . Total shoulder arthroplasty Right 12/22/2013   Procedure: TOTAL SHOULDER ARTHROPLASTY;  Surgeon: Nita Sells, MD;  Location: [place];  Service: Orthopedics;  Laterality: Right;  Right shoulder total arthroplasty   Family History  Problem Relation Age of Onset  . Hypertension      Family history  . Thyroid disease      family history  . Cancer Mother 37    pancratic  . Cancer Father 64    hematologic malignancy   . Arthritis Sister   . Arthritis Sister    History  Substance Use Topics  . Smoking status: Former Smoker -- 1.00 packs/day for 7 years    Types: Cigarettes    Quit date: 03/03/1970  . Smokeless tobacco: Never Used  . Alcohol Use: No   OB History    Gravida Para Term Preterm AB TAB SAB Ectopic Multiple Living   7 6 6       6      Review of Systems Remainder of Review of Systems negative for acute change except as noted in the HPI.  Allergies  Codeine and Doxycycline  Home Medications   Prior to Admission medications   Medication Sig Start Date End Date Taking? Authorizing Provider  Ascorbic Acid (VITAMIN C PO) Take 1 tablet by mouth daily.    Historical Provider, MD  B Complex Vitamins (VITAMIN-B COMPLEX PO) Take 1 tablet by mouth daily.    Historical Provider,  MD  BIOTIN 5000 PO Take 5,000 Units by mouth 2 (two) times a week.     Historical Provider, MD  cephALEXin (KEFLEX) 250 MG capsule Take 1 capsule (250 mg total) by mouth 3 (three) times daily. 03/12/14   Gregor Hams, MD  Diethylpropion HCl 25 MG TABS Take 75 mg by mouth daily.    Historical Provider, MD  Magnesium 250 MG TABS Take 250 mg by mouth 2 (two) times daily.    Historical Provider, MD  methocarbamol (ROBAXIN) 500 MG tablet Take 1 tablet (500 mg total) by mouth every 6 (six) hours as needed for muscle spasms. 12/23/13   Grier Mitts, PA-C  Multiple Vitamins-Minerals (MULTIVITAMIN PO) Take 1 tablet by mouth daily.    Historical Provider, MD  OVER THE COUNTER MEDICATION Take 50 mg by mouth 2 (two) times a week. DHEA 50 mg     Historical Provider, MD  OxyCODONE (OXYCONTIN) 20 mg T12A 12 hr tablet Take 1 tablet (20 mg total) by mouth every 8 (eight) hours. 12/23/13   Grier Mitts, PA-C  oxyCODONE-acetaminophen (PERCOCET) 10-325 MG per tablet Take 1 tablet by mouth 3 (three) times daily.     Historical Provider, MD  phenazopyridine (PYRIDIUM) 200 MG tablet Take 1 tablet by mouth every 6-8 hours if needed for urinary pain 07/29/14   Jacqulyn Cane, MD  sulfamethoxazole-trimethoprim (BACTRIM DS,SEPTRA DS) 800-160 MG per tablet Take 1 tablet by mouth 2 (two) times daily. 07/29/14 08/05/14  Jacqulyn Cane, MD  telmisartan (MICARDIS) 40 MG tablet Take 20 mg by mouth daily.     Historical Provider, MD  thyroid (ARMOUR) 60 MG tablet Take 120 mg by mouth daily before breakfast.    Historical Provider, MD  Vitamin D, Ergocalciferol, (DRISDOL) 50000 UNITS CAPS capsule Take 50,000 Units by mouth every 7 (seven) days.    Historical Provider, MD   BP 156/88 mmHg  Pulse 78  Temp(Src) 98.4 F (36.9 C) (Oral)  Resp 16  Ht 5\' 7"  (1.702 m)  Wt 243 lb 4 oz (110.337 kg)  BMI 38.09 kg/m2  SpO2 97% Physical Exam  Constitutional: She is oriented to person, place, and time. She appears well-developed and well-nourished. No distress.  HENT:  Mouth/Throat: Oropharynx is clear and moist.  Eyes: No scleral icterus.  Neck: Neck supple.  Cardiovascular: Normal rate, regular rhythm and normal heart sounds.   Pulmonary/Chest: Breath sounds normal.  Abdominal: Soft. She exhibits no mass. There is no hepatosplenomegaly. There is tenderness in the suprapubic area. There is no rebound, no guarding and no CVA tenderness.  Lymphadenopathy:    She has no cervical adenopathy.  Neurological: She is alert and oriented to person, place, and time.  Skin: Skin is warm and dry.  Nursing note and vitals reviewed.   ED Course  Procedures (including critical care time) Labs Review Labs Reviewed  POCT URINALYSIS DIP (MANUAL ENTRY)   Results for orders  placed or performed during the hospital encounter of 07/29/14  POCT urinalysis dipstick (new)  Result Value Ref Range   Color, UA yellow    Clarity, UA clear    Glucose, UA =100    Bilirubin, UA negative    Bilirubin, UA negative    Spec Grav, UA 1.020 1.005 - 1.03   Blood, UA large    pH, UA 5.5 5 - 8   Protein Ur, POC     Urobilinogen, UA 0.2 0 - 1   Nitrite, UA Positive    Leukocytes, UA large (3+)  MDM   1. Acute cystitis with hematuria    Treatment options discussed, as well as risks, benefits, alternatives. Patient voiced understanding and agreement with the following plans:   New Prescriptions   PHENAZOPYRIDINE (PYRIDIUM) 200 MG TABLET    Take 1 tablet by mouth every 6-8 hours if needed for urinary pain   SULFAMETHOXAZOLE-TRIMETHOPRIM (BACTRIM DS,SEPTRA DS) 800-160 MG PER TABLET    Take 1 tablet by mouth 2 (two) times daily.   push fluids and other symptomatic care discussed. Follow-up with your urologist in 5-7 days . or ER if symptoms become worse or any red flags. Precautions discussed. Red flags discussed. Questions invited and answered. Patient voiced understanding and agreement.    Jacqulyn Cane, MD 07/29/14 [phone]

## 2014-07-29 NOTE — ED Notes (Signed)
Patient advises symptoms times 4 days.

## 2014-07-31 LAB — URINE CULTURE: Colony Count: >=100000

## 2014-08-02 ENCOUNTER — Telehealth: Payer: Self-pay | Admitting: Emergency Medicine

## 2014-08-03 DIAGNOSIS — H8111 Benign paroxysmal vertigo, right ear: Secondary | ICD-10-CM | POA: Diagnosis not present

## 2014-08-15 DIAGNOSIS — Z79891 Long term (current) use of opiate analgesic: Secondary | ICD-10-CM | POA: Diagnosis not present

## 2014-08-15 DIAGNOSIS — N3010 Interstitial cystitis (chronic) without hematuria: Secondary | ICD-10-CM | POA: Diagnosis not present

## 2014-08-15 DIAGNOSIS — M150 Primary generalized (osteo)arthritis: Secondary | ICD-10-CM | POA: Diagnosis not present

## 2014-08-15 DIAGNOSIS — N390 Urinary tract infection, site not specified: Secondary | ICD-10-CM | POA: Diagnosis not present

## 2014-08-15 DIAGNOSIS — G894 Chronic pain syndrome: Secondary | ICD-10-CM | POA: Diagnosis not present

## 2014-08-28 DIAGNOSIS — L02414 Cutaneous abscess of left upper limb: Secondary | ICD-10-CM | POA: Diagnosis not present

## 2014-09-05 DIAGNOSIS — L720 Epidermal cyst: Secondary | ICD-10-CM | POA: Diagnosis not present

## 2014-09-11 DIAGNOSIS — M150 Primary generalized (osteo)arthritis: Secondary | ICD-10-CM | POA: Diagnosis not present

## 2014-09-11 DIAGNOSIS — G894 Chronic pain syndrome: Secondary | ICD-10-CM | POA: Diagnosis not present

## 2014-09-11 DIAGNOSIS — Z79891 Long term (current) use of opiate analgesic: Secondary | ICD-10-CM | POA: Diagnosis not present

## 2014-10-09 DIAGNOSIS — Z79891 Long term (current) use of opiate analgesic: Secondary | ICD-10-CM | POA: Diagnosis not present

## 2014-10-09 DIAGNOSIS — M150 Primary generalized (osteo)arthritis: Secondary | ICD-10-CM | POA: Diagnosis not present

## 2014-10-09 DIAGNOSIS — G894 Chronic pain syndrome: Secondary | ICD-10-CM | POA: Diagnosis not present

## 2014-11-07 DIAGNOSIS — G894 Chronic pain syndrome: Secondary | ICD-10-CM | POA: Diagnosis not present

## 2014-11-07 DIAGNOSIS — Z79891 Long term (current) use of opiate analgesic: Secondary | ICD-10-CM | POA: Diagnosis not present

## 2014-11-07 DIAGNOSIS — M150 Primary generalized (osteo)arthritis: Secondary | ICD-10-CM | POA: Diagnosis not present

## 2014-11-13 DIAGNOSIS — R7309 Other abnormal glucose: Secondary | ICD-10-CM | POA: Diagnosis not present

## 2014-11-13 DIAGNOSIS — E559 Vitamin D deficiency, unspecified: Secondary | ICD-10-CM | POA: Diagnosis not present

## 2014-11-13 DIAGNOSIS — E669 Obesity, unspecified: Secondary | ICD-10-CM | POA: Diagnosis not present

## 2014-12-05 DIAGNOSIS — G894 Chronic pain syndrome: Secondary | ICD-10-CM | POA: Diagnosis not present

## 2014-12-05 DIAGNOSIS — M150 Primary generalized (osteo)arthritis: Secondary | ICD-10-CM | POA: Diagnosis not present

## 2014-12-05 DIAGNOSIS — Z79891 Long term (current) use of opiate analgesic: Secondary | ICD-10-CM | POA: Diagnosis not present

## 2014-12-13 DIAGNOSIS — R350 Frequency of micturition: Secondary | ICD-10-CM | POA: Diagnosis not present

## 2014-12-20 DIAGNOSIS — H8111 Benign paroxysmal vertigo, right ear: Secondary | ICD-10-CM | POA: Diagnosis not present

## 2015-01-01 DIAGNOSIS — Z79891 Long term (current) use of opiate analgesic: Secondary | ICD-10-CM | POA: Diagnosis not present

## 2015-01-01 DIAGNOSIS — G894 Chronic pain syndrome: Secondary | ICD-10-CM | POA: Diagnosis not present

## 2015-01-01 DIAGNOSIS — M150 Primary generalized (osteo)arthritis: Secondary | ICD-10-CM | POA: Diagnosis not present

## 2015-01-03 DIAGNOSIS — H8111 Benign paroxysmal vertigo, right ear: Secondary | ICD-10-CM | POA: Diagnosis not present

## 2015-01-30 DIAGNOSIS — Z79891 Long term (current) use of opiate analgesic: Secondary | ICD-10-CM | POA: Diagnosis not present

## 2015-01-30 DIAGNOSIS — M150 Primary generalized (osteo)arthritis: Secondary | ICD-10-CM | POA: Diagnosis not present

## 2015-01-30 DIAGNOSIS — G894 Chronic pain syndrome: Secondary | ICD-10-CM | POA: Diagnosis not present

## 2015-02-28 DIAGNOSIS — G894 Chronic pain syndrome: Secondary | ICD-10-CM | POA: Diagnosis not present

## 2015-02-28 DIAGNOSIS — M150 Primary generalized (osteo)arthritis: Secondary | ICD-10-CM | POA: Diagnosis not present

## 2015-02-28 DIAGNOSIS — Z79891 Long term (current) use of opiate analgesic: Secondary | ICD-10-CM | POA: Diagnosis not present

## 2015-03-28 DIAGNOSIS — G894 Chronic pain syndrome: Secondary | ICD-10-CM | POA: Diagnosis not present

## 2015-03-28 DIAGNOSIS — Z79891 Long term (current) use of opiate analgesic: Secondary | ICD-10-CM | POA: Diagnosis not present

## 2015-03-28 DIAGNOSIS — M150 Primary generalized (osteo)arthritis: Secondary | ICD-10-CM | POA: Diagnosis not present

## 2015-04-02 DIAGNOSIS — G894 Chronic pain syndrome: Secondary | ICD-10-CM | POA: Diagnosis not present

## 2015-04-02 DIAGNOSIS — Z79891 Long term (current) use of opiate analgesic: Secondary | ICD-10-CM | POA: Diagnosis not present

## 2015-04-02 DIAGNOSIS — M150 Primary generalized (osteo)arthritis: Secondary | ICD-10-CM | POA: Diagnosis not present

## 2015-04-30 DIAGNOSIS — M25551 Pain in right hip: Secondary | ICD-10-CM | POA: Diagnosis not present

## 2015-04-30 DIAGNOSIS — M150 Primary generalized (osteo)arthritis: Secondary | ICD-10-CM | POA: Diagnosis not present

## 2015-04-30 DIAGNOSIS — G894 Chronic pain syndrome: Secondary | ICD-10-CM | POA: Diagnosis not present

## 2015-04-30 DIAGNOSIS — Z79891 Long term (current) use of opiate analgesic: Secondary | ICD-10-CM | POA: Diagnosis not present

## 2015-05-09 DIAGNOSIS — H40013 Open angle with borderline findings, low risk, bilateral: Secondary | ICD-10-CM | POA: Diagnosis not present

## 2015-05-23 DIAGNOSIS — G894 Chronic pain syndrome: Secondary | ICD-10-CM | POA: Diagnosis not present

## 2015-05-23 DIAGNOSIS — M150 Primary generalized (osteo)arthritis: Secondary | ICD-10-CM | POA: Diagnosis not present

## 2015-05-23 DIAGNOSIS — Z79891 Long term (current) use of opiate analgesic: Secondary | ICD-10-CM | POA: Diagnosis not present

## 2015-05-23 DIAGNOSIS — M25551 Pain in right hip: Secondary | ICD-10-CM | POA: Diagnosis not present

## 2015-06-20 DIAGNOSIS — Z79891 Long term (current) use of opiate analgesic: Secondary | ICD-10-CM | POA: Diagnosis not present

## 2015-06-20 DIAGNOSIS — M150 Primary generalized (osteo)arthritis: Secondary | ICD-10-CM | POA: Diagnosis not present

## 2015-06-20 DIAGNOSIS — M25551 Pain in right hip: Secondary | ICD-10-CM | POA: Diagnosis not present

## 2015-06-20 DIAGNOSIS — G894 Chronic pain syndrome: Secondary | ICD-10-CM | POA: Diagnosis not present

## 2015-07-09 DIAGNOSIS — R7309 Other abnormal glucose: Secondary | ICD-10-CM | POA: Diagnosis not present

## 2015-07-09 DIAGNOSIS — F411 Generalized anxiety disorder: Secondary | ICD-10-CM | POA: Diagnosis not present

## 2015-07-09 DIAGNOSIS — F430 Acute stress reaction: Secondary | ICD-10-CM | POA: Diagnosis not present

## 2015-07-09 DIAGNOSIS — E669 Obesity, unspecified: Secondary | ICD-10-CM | POA: Diagnosis not present

## 2015-07-09 DIAGNOSIS — R109 Unspecified abdominal pain: Secondary | ICD-10-CM | POA: Diagnosis not present

## 2015-07-09 DIAGNOSIS — E039 Hypothyroidism, unspecified: Secondary | ICD-10-CM | POA: Diagnosis not present

## 2015-07-17 DIAGNOSIS — G894 Chronic pain syndrome: Secondary | ICD-10-CM | POA: Diagnosis not present

## 2015-07-17 DIAGNOSIS — Z79891 Long term (current) use of opiate analgesic: Secondary | ICD-10-CM | POA: Diagnosis not present

## 2015-07-17 DIAGNOSIS — M25511 Pain in right shoulder: Secondary | ICD-10-CM | POA: Diagnosis not present

## 2015-07-17 DIAGNOSIS — M150 Primary generalized (osteo)arthritis: Secondary | ICD-10-CM | POA: Diagnosis not present

## 2015-07-24 DIAGNOSIS — E039 Hypothyroidism, unspecified: Secondary | ICD-10-CM | POA: Diagnosis not present

## 2015-08-14 DIAGNOSIS — M150 Primary generalized (osteo)arthritis: Secondary | ICD-10-CM | POA: Diagnosis not present

## 2015-08-14 DIAGNOSIS — E039 Hypothyroidism, unspecified: Secondary | ICD-10-CM | POA: Diagnosis not present

## 2015-08-14 DIAGNOSIS — Z79891 Long term (current) use of opiate analgesic: Secondary | ICD-10-CM | POA: Diagnosis not present

## 2015-08-14 DIAGNOSIS — G894 Chronic pain syndrome: Secondary | ICD-10-CM | POA: Diagnosis not present

## 2015-08-14 DIAGNOSIS — M25551 Pain in right hip: Secondary | ICD-10-CM | POA: Diagnosis not present

## 2015-08-22 ENCOUNTER — Emergency Department (INDEPENDENT_AMBULATORY_CARE_PROVIDER_SITE_OTHER)
Admission: EM | Admit: 2015-08-22 | Discharge: 2015-08-22 | Disposition: A | Discharge status: Home / Self Care | Payer: Medicare Other | Source: Home / Self Care | Attending: Family Medicine | Admitting: Family Medicine

## 2015-08-22 ENCOUNTER — Encounter: Payer: Self-pay | Admitting: *Deleted

## 2015-08-22 DIAGNOSIS — B9689 Other specified bacterial agents as the cause of diseases classified elsewhere: Secondary | ICD-10-CM

## 2015-08-22 DIAGNOSIS — Z8679 Personal history of other diseases of the circulatory system: Secondary | ICD-10-CM

## 2015-08-22 DIAGNOSIS — IMO0001 Reserved for inherently not codable concepts without codable children: Secondary | ICD-10-CM

## 2015-08-22 DIAGNOSIS — R030 Elevated blood-pressure reading, without diagnosis of hypertension: Secondary | ICD-10-CM | POA: Diagnosis not present

## 2015-08-22 DIAGNOSIS — J019 Acute sinusitis, unspecified: Secondary | ICD-10-CM | POA: Diagnosis not present

## 2015-08-22 MED ORDER — AMOXICILLIN 500 MG PO CAPS: 500.0000 mg | ORAL_CAPSULE | Freq: Two times a day (BID) | ORAL | Status: DC

## 2015-08-22 MED ORDER — FLUTICASONE PROPIONATE 50 MCG/ACT NA SUSP: 2.0000 | Freq: Every day | NASAL | Status: AC

## 2015-08-22 NOTE — ED Provider Notes (Signed)
CSN: [id number]     Arrival date & time 08/22/15  1336 History   First MD Initiated Contact with Patient 08/22/15 1345     Chief Complaint  Patient presents with  . Nasal Congestion   (Consider location/radiation/quality/duration/timing/severity/associated sxs/prior Treatment) HPI  Sally [name] is a 70 y.o. female presenting to UC with c/o 1 month of gradually worsening sinus congestion with mild to moderate productive cough, fatigue and frontal headache with sinus pressure.  She has been trying OTC cough/cold medication but no relief.  She notes her husband is going to have chemotherapy tomorrow and wants to make sure she is okay to be around him. Denies fever, chills, n/v/d.   BP elevated in triage. Hx of HTN. She admits to not taking her BP medication as she notes she has been trying to take care of her husband. Denies dizziness, chest pain or SOB.  She notes she goes to pain management about once a month and states her BP is always "good" there.    Past Medical History  Diagnosis Date  . Fibromyalgia   . Chronic pain syndrome   . Hx of colonic polyps   . Hypertension   . Obesity   . Chronic UTI   . Allergy   . Hypothyroidism   . Pre-diabetes     "barely" (12/22/2013)  . Arthritis     "all over"   Past Surgical History  Procedure Laterality Date  . Cesarean section  1980; 1985  . Cholecystectomy  1982  . Tonsillectomy  1959  . Foot surgery  1992  . Knee arthroscopy Right 1992  . Appendectomy  1965  . Abdominal hysterectomy  1990  . 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 Total shoulder arthroplasty Right 12/22/2013  . Dilation and curettage of uterus  1990  . Tubal ligation  1985  . Total shoulder arthroplasty Right 12/22/2013    Procedure: TOTAL SHOULDER ARTHROPLASTY;  Surgeon: Nita Sells, MD;  Location: [place];  Service: Orthopedics;  Laterality: Right;  Right shoulder total arthroplasty   Family History  Problem Relation Age of Onset  . Hypertension      Family history  . Thyroid disease      family history  . Cancer Mother 40    pancratic  . Cancer Father 24    hematologic malignancy   . Arthritis Sister   . Arthritis Sister    Social History  Substance Use Topics  . Smoking status: Former Smoker -- 1.00 packs/day for 7 years    Types: Cigarettes    Quit date: 03/03/1970  . Smokeless tobacco: Never Used  . Alcohol Use: No   OB History    Gravida Para Term Preterm AB TAB SAB Ectopic Multiple Living   7 6 6       6      Review of Systems  Constitutional: Positive for fatigue. Negative for fever and chills.  HENT: Positive for congestion, ear pain ( pressure), rhinorrhea, sinus pressure and sore throat. Negative for trouble swallowing and voice change.   Respiratory: Positive for cough. Negative for shortness of breath.   Cardiovascular: Negative for chest pain and palpitations.  Gastrointestinal: Negative for nausea, vomiting, abdominal pain and diarrhea.  Musculoskeletal: Negative for myalgias, back pain and arthralgias.  Skin: Negative for rash.  Neurological: Positive for headaches.  Negative for dizziness and light-headedness.    Allergies  Codeine and Doxycycline  Home Medications   Prior to Admission medications   Medication Sig Start Date End Date Taking? Authorizing Provider  OxyCODONE (OXYCONTIN) 20 mg T12A 12 hr tablet Take 1 tablet (20 mg total) by mouth every 8 (eight) hours. 12/23/13  Yes Grier Mitts, PA-C  oxyCODONE-acetaminophen (PERCOCET) 10-325 MG per tablet Take 1 tablet by mouth 3 (three) times daily.    Yes Historical Provider, MD  thyroid (ARMOUR) 60 MG tablet Take 120 mg by mouth daily before breakfast.   Yes Historical Provider, MD  amoxicillin (AMOXIL) 500 MG capsule Take 1 capsule (500 mg total) by mouth 2  (two) times daily. For 10 days 08/22/15   Noland Fordyce, PA-C  Ascorbic Acid (VITAMIN C PO) Take 1 tablet by mouth daily.    Historical Provider, MD  B Complex Vitamins (VITAMIN-B COMPLEX PO) Take 1 tablet by mouth daily.    Historical Provider, MD  BIOTIN 5000 PO Take 5,000 Units by mouth 2 (two) times a week.     Historical Provider, MD  Diethylpropion HCl 25 MG TABS Take 75 mg by mouth daily.    Historical Provider, MD  fluticasone (FLONASE) 50 MCG/ACT nasal spray Place 2 sprays into both nostrils daily. 08/22/15   Noland Fordyce, PA-C  Magnesium 250 MG TABS Take 250 mg by mouth 2 (two) times daily.    Historical Provider, MD  Multiple Vitamins-Minerals (MULTIVITAMIN PO) Take 1 tablet by mouth daily.    Historical Provider, MD  OVER THE COUNTER MEDICATION Take 50 mg by mouth 2 (two) times a week. DHEA 50 mg    Historical Provider, MD  phenazopyridine (PYRIDIUM) 200 MG tablet Take 1 tablet by mouth every 6-8 hours if needed for urinary pain 07/29/14   Jacqulyn Cane, MD  telmisartan (MICARDIS) 40 MG tablet Take 20 mg by mouth daily.     Historical Provider, MD  Vitamin D, Ergocalciferol, (DRISDOL) 50000 UNITS CAPS capsule Take 50,000 Units by mouth every 7 (seven) days.    Historical Provider, MD   Meds Ordered and Administered this Visit  Medications - No data to display  BP 177/103 mmHg  Pulse 81  Temp(Src) 98.5 F (36.9 C) (Oral)  Resp 16  Ht 5\' 7"  (1.702 m)  Wt 231 lb (104.781 kg)  BMI 36.17 kg/m2  SpO2 97% No data found.   Physical Exam  Constitutional: She appears well-developed and well-nourished. No distress.  HENT:  Head: Normocephalic and atraumatic.  Right Ear: Tympanic membrane normal.  Left Ear: Tympanic membrane normal.  Nose: Mucosal edema present. Right sinus exhibits maxillary sinus tenderness. Right sinus exhibits no frontal sinus tenderness. Left sinus exhibits maxillary sinus tenderness and frontal sinus tenderness.  Mouth/Throat: Uvula is midline, oropharynx is  clear and moist and mucous membranes are normal.  Eyes: Conjunctivae are normal. No scleral icterus.  Neck: Normal range of motion. Neck supple.  Cardiovascular: Normal rate, regular rhythm and normal heart sounds.   Pulmonary/Chest: Effort normal and breath sounds normal. No stridor. No respiratory distress. She has no wheezes. She has no rales.  Musculoskeletal: Normal range of motion.  Lymphadenopathy:    She has no cervical adenopathy.  Neurological: She is alert.  Skin: Skin is warm and dry. She is not diaphoretic.  Nursing note and vitals reviewed.   ED Course  Procedures (including critical care time)  Labs Review Labs Reviewed - No data to display  Imaging Review No results found.  MDM   1. Acute bacterial rhinosinusitis   2. Elevated blood pressure   3. History of hypertension    Hx and exam c/w acute bacterial sinusitis given sinus tenderness and duration of symptoms.  Pt denies allergy to amoxicillin but notes Augmentin causes severe GI upset. She does fine with plain amoxicillin.  Rx: Amoxicillin and Flonase Encouraged fluids and rest.  Encouraged to take her BP medication as prescribed and to f/u with PCP for BP maintenance.   F/u with PCP in 7-10 days if symptoms not improving, sooner if worsening.     Noland Fordyce, PA-C 08/22/15 1651

## 2015-08-22 NOTE — Discharge Instructions (Signed)
You may take 400-600mg  Ibuprofen (Motrin) every 6-8 hours for fever and pain  Alternate with Tylenol  You may take 500mg  Tylenol every 4-6 hours as needed for fever and pain  Follow-up with your primary care provider next week for recheck of symptoms if not improving.  Be sure to drink plenty of fluids and rest, at least 8hrs of sleep a night, preferably more while you are sick. Return urgent care or go to closest ER if you cannot keep down fluids/signs of dehydration, fever not reducing with Tylenol, difficulty breathing/wheezing, stiff neck, worsening condition, or other concerns (see below)  Please take antibiotics as prescribed and be sure to complete entire course even if you start to feel better to ensure infection does not come back.   Sinus Rinse WHAT IS A SINUS RINSE? A sinus rinse is a simple home treatment that is used to rinse your sinuses with a sterile mixture of salt and water (saline solution). Sinuses are air-filled spaces in your skull behind the bones of your face and forehead that open into your nasal cavity. You will use the following:  Saline solution.  Neti pot or spray bottle. This releases the saline solution into your nose and through your sinuses. Neti pots and spray bottles can be purchased at [employer], a health food store, or online. WHEN WOULD I DO A SINUS RINSE? A sinus rinse can help to clear mucus, dirt, dust, or pollen from the nasal cavity. You may do a sinus rinse when you have a cold, a virus, nasal allergy symptoms, a sinus infection, or stuffiness in the nose or sinuses. If you are considering a sinus rinse:  Ask your child's health care provider before performing a sinus rinse on your child.  Do not do a sinus rinse if you have had ear or nasal surgery, ear infection, or blocked ears. HOW DO I DO A SINUS RINSE?  Wash your hands.  Disinfect your device according to the directions provided and then dry it.  Use the solution that comes  with your device or one that is sold separately in stores. Follow the mixing directions on the package.  Fill your device with the amount of saline solution as directed by the device instructions.  Stand over a sink and tilt your head sideways over the sink.  Place the spout of the device in your upper nostril (the one closer to the ceiling).  Gently pour or squeeze the saline solution into the nasal cavity. The liquid should drain to the lower nostril if you are not overly congested.  Gently blow your nose. Blowing too hard may cause ear pain.  Repeat in the other nostril.  Clean and rinse your device with clean water and then air-dry it. ARE THERE RISKS OF A SINUS RINSE?  Sinus rinse is generally very safe and effective. However, there are a few risks, which include:   A burning sensation in the sinuses. This may happen if you do not make the saline solution as directed. Make sure to follow all directions when making the saline solution.  Infection from contaminated water. This is rare, but possible.  Nasal irritation.   This information is not intended to replace advice given to you by your health care provider. Make sure you discuss any questions you have with your health care provider.   Document Released: 09/14/2013 Document Reviewed: 09/14/2013 Elsevier Interactive Patient Education 2016 [employer].  Sinusitis, Adult Sinusitis is redness, soreness, and inflammation of the paranasal sinuses.  Paranasal sinuses are air pockets within the bones of your face. They are located beneath your eyes, in the middle of your forehead, and above your eyes. In healthy paranasal sinuses, mucus is able to drain out, and air is able to circulate through them by way of your nose. However, when your paranasal sinuses are inflamed, mucus and air can become trapped. This can allow bacteria and other germs to grow and cause infection. Sinusitis can develop quickly and last only a short time (acute)  or continue over a long period (chronic). Sinusitis that lasts for more than 12 weeks is considered chronic. CAUSES Causes of sinusitis include:  Allergies.  Structural abnormalities, such as displacement of the cartilage that separates your nostrils (deviated septum), which can decrease the air flow through your nose and sinuses and affect sinus drainage.  Functional abnormalities, such as when the small hairs (cilia) that line your sinuses and help remove mucus do not work properly or are not present. SIGNS AND SYMPTOMS Symptoms of acute and chronic sinusitis are the same. The primary symptoms are pain and pressure around the affected sinuses. Other symptoms include:  Upper toothache.  Earache.  Headache.  Bad breath.  Decreased sense of smell and taste.  A cough, which worsens when you are lying flat.  Fatigue.  Fever.  Thick drainage from your nose, which often is green and may contain pus (purulent).  Swelling and warmth over the affected sinuses. DIAGNOSIS Your health care provider will perform a physical exam. During your exam, your health care provider may perform any of the following to help determine if you have acute sinusitis or chronic sinusitis:  Look in your nose for signs of abnormal growths in your nostrils (nasal polyps).  Tap over the affected sinus to check for signs of infection.  View the inside of your sinuses using an imaging device that has a light attached (endoscope). If your health care provider suspects that you have chronic sinusitis, one or more of the following tests may be recommended:  Allergy tests.  Nasal culture. A sample of mucus is taken from your nose, sent to a lab, and screened for bacteria.  Nasal cytology. A sample of mucus is taken from your nose and examined by your health care provider to determine if your sinusitis is related to an allergy. TREATMENT Most cases of acute sinusitis are related to a viral infection and will  resolve on their own within 10 days. Sometimes, medicines are prescribed to help relieve symptoms of both acute and chronic sinusitis. These may include pain medicines, decongestants, nasal steroid sprays, or saline sprays. However, for sinusitis related to a bacterial infection, your health care provider will prescribe antibiotic medicines. These are medicines that will help kill the bacteria causing the infection. Rarely, sinusitis is caused by a fungal infection. In these cases, your health care provider will prescribe antifungal medicine. For some cases of chronic sinusitis, surgery is needed. Generally, these are cases in which sinusitis recurs more than 3 times per year, despite other treatments. HOME CARE INSTRUCTIONS  Drink plenty of water. Water helps thin the mucus so your sinuses can drain more easily.  Use a humidifier.  Inhale steam 3-4 times a day (for example, sit in the bathroom with the shower running).  Apply a warm, moist washcloth to your face 3-4 times a day, or as directed by your health care provider.  Use saline nasal sprays to help moisten and clean your sinuses.  Take medicines only as directed  by your health care provider.  If you were prescribed either an antibiotic or antifungal medicine, finish it all even if you start to feel better. SEEK IMMEDIATE MEDICAL CARE IF:  You have increasing pain or severe headaches.  You have nausea, vomiting, or drowsiness.  You have swelling around your face.  You have vision problems.  You have a stiff neck.  You have difficulty breathing.   This information is not intended to replace advice given to you by your health care provider. Make sure you discuss any questions you have with your health care provider.   Document Released: 02/17/2005 Document Revised: 03/10/2014 Document Reviewed: 03/04/2011 Elsevier Interactive Patient Education [employer].

## 2015-08-22 NOTE — ED Notes (Signed)
Pt c/o URI s/s x 1 mth. She now c/o nasal congestion, productive cough, fatigue, HA and sinus pain. denies fever. Husband to have chemotherapy tomorrow.

## 2015-08-27 DIAGNOSIS — L821 Other seborrheic keratosis: Secondary | ICD-10-CM | POA: Diagnosis not present

## 2015-08-27 DIAGNOSIS — D0462 Carcinoma in situ of skin of left upper limb, including shoulder: Secondary | ICD-10-CM | POA: Diagnosis not present

## 2015-08-27 DIAGNOSIS — D239 Other benign neoplasm of skin, unspecified: Secondary | ICD-10-CM | POA: Diagnosis not present

## 2015-09-12 DIAGNOSIS — M150 Primary generalized (osteo)arthritis: Secondary | ICD-10-CM | POA: Diagnosis not present

## 2015-09-12 DIAGNOSIS — G894 Chronic pain syndrome: Secondary | ICD-10-CM | POA: Diagnosis not present

## 2015-09-12 DIAGNOSIS — M25551 Pain in right hip: Secondary | ICD-10-CM | POA: Diagnosis not present

## 2015-09-12 DIAGNOSIS — Z79891 Long term (current) use of opiate analgesic: Secondary | ICD-10-CM | POA: Diagnosis not present

## 2015-10-02 DIAGNOSIS — I10 Essential (primary) hypertension: Secondary | ICD-10-CM | POA: Diagnosis not present

## 2015-10-02 DIAGNOSIS — R5383 Other fatigue: Secondary | ICD-10-CM | POA: Diagnosis not present

## 2015-10-02 DIAGNOSIS — E559 Vitamin D deficiency, unspecified: Secondary | ICD-10-CM | POA: Diagnosis not present

## 2015-10-08 DIAGNOSIS — D0462 Carcinoma in situ of skin of left upper limb, including shoulder: Secondary | ICD-10-CM | POA: Diagnosis not present

## 2015-10-10 DIAGNOSIS — M25551 Pain in right hip: Secondary | ICD-10-CM | POA: Diagnosis not present

## 2015-10-10 DIAGNOSIS — M150 Primary generalized (osteo)arthritis: Secondary | ICD-10-CM | POA: Diagnosis not present

## 2015-10-10 DIAGNOSIS — Z79891 Long term (current) use of opiate analgesic: Secondary | ICD-10-CM | POA: Diagnosis not present

## 2015-10-10 DIAGNOSIS — G894 Chronic pain syndrome: Secondary | ICD-10-CM | POA: Diagnosis not present

## 2015-10-11 DIAGNOSIS — M1711 Unilateral primary osteoarthritis, right knee: Secondary | ICD-10-CM | POA: Diagnosis not present

## 2015-10-11 DIAGNOSIS — M7061 Trochanteric bursitis, right hip: Secondary | ICD-10-CM | POA: Diagnosis not present

## 2015-10-19 DIAGNOSIS — R300 Dysuria: Secondary | ICD-10-CM | POA: Diagnosis not present

## 2015-10-20 ENCOUNTER — Encounter: Payer: Self-pay | Admitting: Emergency Medicine

## 2015-10-20 ENCOUNTER — Emergency Department (INDEPENDENT_AMBULATORY_CARE_PROVIDER_SITE_OTHER)
Admission: EM | Admit: 2015-10-20 | Discharge: 2015-10-20 | Discharge status: Against Medical Advice | Payer: Medicare Other | Source: Home / Self Care | Attending: Family Medicine | Admitting: Family Medicine

## 2015-10-20 DIAGNOSIS — R195 Other fecal abnormalities: Secondary | ICD-10-CM | POA: Diagnosis not present

## 2015-10-20 NOTE — ED Provider Notes (Signed)
[facility]    CSN: [id number] Arrival date & time: 10/20/15  1626  First Provider Contact:  First MD Initiated Contact with Patient 10/20/15 [place]        History   Chief Complaint Chief Complaint  Patient presents with  . Diarrhea    HPI Sally [name] is a 70 y.o. female.   Patient reports that she was recently treated for UTI and just finished a 3 day course of Bactrim with resolution of her GU symptoms.  Today she had a bowel movement and was alarmed to observe what appeared to be worm-like structures.  She states that she has been fatigued lately, but denies abdominal pain, fever, or other symptoms.  She denies foreign travel.   The history is provided by the patient.    Past Medical History:  Diagnosis Date  . Allergy   . Arthritis    "all over"  . Chronic pain syndrome   . Chronic UTI   . Fibromyalgia   . Hx of colonic polyps   . Hypertension   . Hypothyroidism   . Obesity   . Pre-diabetes    "barely" (12/22/2013)    Patient Active Problem List   Diagnosis Date Noted  . Arthritis of shoulder region, right, degenerative 12/22/2013  . Osteoarthritis of right shoulder 08/09/2013  . Right tennis elbow 08/09/2013  . 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 Fibromyalgia 08/15/2010  . Chronic pain 08/15/2010  . HYPOTHYROIDISM 05/15/2009  . HYPERTENSION 05/15/2009  . ALLERGIC RHINITIS 05/15/2009  . DYSURIA 05/15/2009  . COLONIC POLYPS, HX OF 05/15/2009    Past Surgical History:  Procedure Laterality Date  . ABDOMINAL HYSTERECTOMY  1990  . APPENDECTOMY  1965  . [place]; 1985  . CHOLECYSTECTOMY  1982  . DILATION AND CURETTAGE OF UTERUS  1990  . FOOT SURGERY  1992  . KNEE ARTHROSCOPY Right 1992  . TONSILLECTOMY  1959  . TOTAL SHOULDER ARTHROPLASTY Left 11/25/2012   Procedure: TOTAL SHOULDER ARTHROPLASTY;  Surgeon: Nita Sells, MD;  Location: [facility];  Service: Orthopedics;  Laterality: Left;  LEFT  TOTAL SHOULDER ARTHROPLASTY  . TOTAL SHOULDER ARTHROPLASTY Right 12/22/2013  . TOTAL SHOULDER ARTHROPLASTY Right 12/22/2013   Procedure: TOTAL SHOULDER ARTHROPLASTY;  Surgeon: Nita Sells, MD;  Location: [facility];  Service: Orthopedics;  Laterality: Right;  Right shoulder total arthroplasty  . TUBAL LIGATION  1985    OB History    Gravida Para Term Preterm AB Living   7 6 6     6    SAB TAB Ectopic Multiple Live Births                   Home Medications    Prior to Admission medications   Medication Sig Start Date End Date Taking? Authorizing Provider  amoxicillin (AMOXIL) 500 MG capsule Take 1 capsule (500 mg total) by mouth 2 (two) times daily. For 10 days 08/22/15   Noland Fordyce, PA-C  Ascorbic Acid (VITAMIN C PO) Take 1 tablet by mouth daily.    Historical Provider, MD  B Complex Vitamins (VITAMIN-B COMPLEX PO) Take 1 tablet by mouth daily.    Historical Provider, MD  BIOTIN 5000 PO Take 5,000 Units by mouth 2 (two) times a week.     Historical Provider, MD  Diethylpropion HCl 25 MG TABS Take 75 mg by mouth daily.    Historical Provider, MD  fluticasone (FLONASE) 50 MCG/ACT nasal spray Place 2 sprays into  both nostrils daily. 08/22/15   Noland Fordyce, PA-C  Magnesium 250 MG TABS Take 250 mg by mouth 2 (two) times daily.    Historical Provider, MD  Multiple Vitamins-Minerals (MULTIVITAMIN PO) Take 1 tablet by mouth daily.    Historical Provider, MD  OVER THE COUNTER MEDICATION Take 50 mg by mouth 2 (two) times a week. DHEA 50 mg    Historical Provider, MD  OxyCODONE (OXYCONTIN) 20 mg T12A 12 hr tablet Take 1 tablet (20 mg total) by mouth every 8 (eight) hours. 12/23/13   Grier Mitts, PA-C  oxyCODONE-acetaminophen (PERCOCET) 10-325 MG per tablet Take 1 tablet by mouth 3 (three) times daily.     Historical Provider, MD  phenazopyridine (PYRIDIUM) 200 MG tablet Take 1 tablet by mouth every 6-8 hours if needed for urinary pain 07/29/14   Jacqulyn Cane, MD  telmisartan  (MICARDIS) 40 MG tablet Take 20 mg by mouth daily.     Historical Provider, MD  thyroid (ARMOUR) 60 MG tablet Take 120 mg by mouth daily before breakfast.    Historical Provider, MD  Vitamin D, Ergocalciferol, (DRISDOL) 50000 UNITS CAPS capsule Take 50,000 Units by mouth every 7 (seven) days.    Historical Provider, MD    Family History Family History  Problem Relation Age of Onset  . Cancer Mother 74    pancratic  . Cancer Father 10    hematologic malignancy   . Arthritis Sister   . Arthritis Sister   . Hypertension      Family history  . Thyroid disease      family history    Social History Social History  Substance Use Topics  . Smoking status: Former Smoker    Packs/day: 1.00    Years: 7.00    Types: Cigarettes    Quit date: 03/03/1970  . Smokeless tobacco: Never Used  . Alcohol use No     Allergies   Codeine and Doxycycline   Review of Systems Review of Systems  Constitutional: Positive for fatigue. Negative for activity change, appetite change, chills, diaphoresis, fever and unexpected weight change.  HENT: Negative.   Eyes: Negative.   Respiratory: Negative.   Cardiovascular: Negative.   Gastrointestinal: Negative for abdominal distention, abdominal pain, anal bleeding, blood in stool, constipation, diarrhea, nausea, rectal pain and vomiting.       Observed abnormal stool today.  Genitourinary: Negative.   Musculoskeletal: Negative.   Skin: Negative.   Neurological: Negative.   Hematological: Negative.      Physical Exam Triage Vital Signs ED Triage Vitals  Enc Vitals Group     BP 10/20/15 1629 155/92     Pulse Rate 10/20/15 1629 90     Resp 10/20/15 1629 16     Temp 10/20/15 1629 98.5 F (36.9 C)     Temp Source 10/20/15 1629 Oral     SpO2 10/20/15 1629 97 %     Weight 10/20/15 1629 229 lb (103.9 kg)     Height 10/20/15 1629 5\' 7"  (1.702 m)     Head Circumference --      Peak Flow --      Pain Score 10/20/15 1633 0     Pain Loc --      Pain  Edu? --      Excl. in [place]? --    No data found.   Updated Vital Signs BP 155/92 (BP Location: Right Arm)   Pulse 90   Temp 98.5 F (36.9 C) (Oral)   Resp 16  Ht 5\' 7"  (1.702 m)   Wt 229 lb (103.9 kg)   SpO2 97%   BMI 35.87 kg/m   Visual Acuity Right Eye Distance:   Left Eye Distance:   Bilateral Distance:    Right Eye Near:   Left Eye Near:    Bilateral Near:     Physical Exam  Constitutional: She is oriented to person, place, and time. She appears well-developed and well-nourished. No distress.  HENT:  Head: Normocephalic.  Nose: Nose normal.  Mouth/Throat: Oropharynx is clear and moist.  Eyes: Conjunctivae and EOM are normal. Pupils are equal, round, and reactive to light.  Neck: Neck supple.  Cardiovascular: Normal heart sounds.   Pulmonary/Chest: Breath sounds normal.  Abdominal: Soft. Bowel sounds are normal. She exhibits no distension and no mass. There is no tenderness. There is no guarding.  Musculoskeletal: She exhibits no edema.  Lymphadenopathy:    She has no cervical adenopathy.  Neurological: She is alert and oriented to person, place, and time.  Skin: Skin is warm and dry. No rash noted.  Nursing note and vitals reviewed.    UC Treatments / Results  Labs (all labs ordered are listed, but only abnormal results are displayed) Labs Reviewed - No data to display  EKG  EKG Interpretation None       Radiology No results found.  Procedures Procedures (including critical care time)  Medications Ordered in UC Medications - No data to display   Initial Impression / Assessment and Plan / UC Course  I have reviewed the triage vital signs and the nursing notes.  Pertinent labs & imaging results that were available during my care of the patient were reviewed by me and considered in my medical decision making (see chart for details).  Clinical Course  Recommended CBC/diff.  Encouraged patient to return with stool specimen for stool  analysis. She declined further evaluation, and plans to follow-up with her gastroenterologist.     Final Clinical Impressions(s) / UC Diagnoses   Final diagnoses:  Abnormal findings in stool; ?tapeworms/whipworms    New Prescriptions Discharge Medication List as of 10/20/2015  5:29 PM       Kandra Nicolas, MD 10/20/15 [phone]

## 2015-10-20 NOTE — ED Triage Notes (Signed)
Reports having one stool today that she thinks may have had parasites.

## 2015-10-22 DIAGNOSIS — B829 Intestinal parasitism, unspecified: Secondary | ICD-10-CM | POA: Diagnosis not present

## 2015-10-23 ENCOUNTER — Telehealth: Payer: Self-pay | Admitting: *Deleted

## 2015-10-23 NOTE — Telephone Encounter (Signed)
Callback: Pt repots she is not improved and plans to see her GI. She was very unhappy with her visit as she was told we could not accept the stool specimen she brought in and was told she would have to bring back another.

## 2015-11-03 DIAGNOSIS — L259 Unspecified contact dermatitis, unspecified cause: Secondary | ICD-10-CM | POA: Diagnosis not present

## 2015-11-07 DIAGNOSIS — G894 Chronic pain syndrome: Secondary | ICD-10-CM | POA: Diagnosis not present

## 2015-11-07 DIAGNOSIS — M150 Primary generalized (osteo)arthritis: Secondary | ICD-10-CM | POA: Diagnosis not present

## 2015-11-07 DIAGNOSIS — Z79891 Long term (current) use of opiate analgesic: Secondary | ICD-10-CM | POA: Diagnosis not present

## 2015-11-07 DIAGNOSIS — M25551 Pain in right hip: Secondary | ICD-10-CM | POA: Diagnosis not present

## 2015-11-19 DIAGNOSIS — J069 Acute upper respiratory infection, unspecified: Secondary | ICD-10-CM | POA: Diagnosis not present

## 2015-12-04 ENCOUNTER — Encounter (HOSPITAL_BASED_OUTPATIENT_CLINIC_OR_DEPARTMENT_OTHER): Payer: Self-pay | Admitting: *Deleted

## 2015-12-04 ENCOUNTER — Emergency Department (HOSPITAL_BASED_OUTPATIENT_CLINIC_OR_DEPARTMENT_OTHER)
Admission: EM | Admit: 2015-12-04 | Discharge: 2015-12-05 | Discharge status: Against Medical Advice | Payer: Medicare Other | Attending: Emergency Medicine | Admitting: Emergency Medicine

## 2015-12-04 DIAGNOSIS — Z79899 Other long term (current) drug therapy: Secondary | ICD-10-CM | POA: Diagnosis not present

## 2015-12-04 DIAGNOSIS — Z87891 Personal history of nicotine dependence: Secondary | ICD-10-CM | POA: Diagnosis not present

## 2015-12-04 DIAGNOSIS — E119 Type 2 diabetes mellitus without complications: Secondary | ICD-10-CM | POA: Diagnosis not present

## 2015-12-04 DIAGNOSIS — E039 Hypothyroidism, unspecified: Secondary | ICD-10-CM | POA: Insufficient documentation

## 2015-12-04 DIAGNOSIS — Z79891 Long term (current) use of opiate analgesic: Secondary | ICD-10-CM | POA: Diagnosis not present

## 2015-12-04 DIAGNOSIS — I10 Essential (primary) hypertension: Secondary | ICD-10-CM | POA: Diagnosis not present

## 2015-12-04 NOTE — ED Triage Notes (Signed)
Pt is here for evaluation of high BP.  Pt states that she has been "stressed out due to husbands health and dog that won't use the bathroom outside".  Pt has BP pills which she has been taking, last dose of mycardis 20 taken this pm.  Pt spoke with PCP regarding elevated BP and he advised her to come to ED.  No CP with this, pt reports that she had some fullness, HA and heard a rushing sound in her ears which is now resolved

## 2015-12-05 ENCOUNTER — Encounter (HOSPITAL_BASED_OUTPATIENT_CLINIC_OR_DEPARTMENT_OTHER): Payer: Self-pay | Admitting: Emergency Medicine

## 2015-12-05 DIAGNOSIS — M25551 Pain in right hip: Secondary | ICD-10-CM | POA: Diagnosis not present

## 2015-12-05 DIAGNOSIS — M150 Primary generalized (osteo)arthritis: Secondary | ICD-10-CM | POA: Diagnosis not present

## 2015-12-05 DIAGNOSIS — Z79891 Long term (current) use of opiate analgesic: Secondary | ICD-10-CM | POA: Diagnosis not present

## 2015-12-05 DIAGNOSIS — G894 Chronic pain syndrome: Secondary | ICD-10-CM | POA: Diagnosis not present

## 2015-12-05 NOTE — ED Notes (Addendum)
Upon completion of vital signs patient takes of Bp cuff  states " I need to go my husband is at home by himself."  Patient then procedes to walk out of the room upon explanation that she needed to wait to be discharged.

## 2015-12-05 NOTE — ED Provider Notes (Signed)
[facility] Provider Note   CSN: [id number] Arrival date & time: 12/04/15  2335     History   Chief Complaint Chief Complaint  Patient presents with  . Hypertension    HPI Sally [name] is a 70 y.o. female.  The history is provided by the patient.  Hypertension  This is a chronic problem. The current episode started more than 1 week ago. The problem occurs constantly. The problem has been gradually worsening. Pertinent negatives include no chest pain, no abdominal pain, no headaches and no shortness of breath. The symptoms are aggravated by stress (husband is being treated for cancer and she is doing a lot of transporting the patient to appointments and caring for the patient.  She has not been adhering to a low sodium diet). Nothing relieves the symptoms. Treatments tried: extra dose of micardis. The treatment provided mild relief.    Past Medical History:  Diagnosis Date  . Allergy   . Arthritis    "all over"  . Chronic pain syndrome   . Chronic UTI   . Fibromyalgia   . Hx of colonic polyps   . Hypertension   . Hypothyroidism   . Obesity   . Pre-diabetes    "barely" (12/22/2013)    Patient Active Problem List   Diagnosis Date Noted  . Arthritis of shoulder region, right, degenerative 12/22/2013  . Osteoarthritis of right shoulder 08/09/2013  . Right tennis elbow 08/09/2013  . 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 Fibromyalgia 08/15/2010  . Chronic pain 08/15/2010  . HYPOTHYROIDISM 05/15/2009  . HYPERTENSION 05/15/2009  . ALLERGIC RHINITIS 05/15/2009  . DYSURIA 05/15/2009  . COLONIC POLYPS, HX OF 05/15/2009    Past Surgical History:  Procedure Laterality Date  . ABDOMINAL HYSTERECTOMY  1990  . APPENDECTOMY  1965  . [place]; 1985  . CHOLECYSTECTOMY  1982  . DILATION AND CURETTAGE OF UTERUS  1990  . FOOT SURGERY  1992  . KNEE ARTHROSCOPY Right 1992  . TONSILLECTOMY  1959  . TOTAL SHOULDER ARTHROPLASTY Left  11/25/2012   Procedure: TOTAL SHOULDER ARTHROPLASTY;  Surgeon: Nita Sells, MD;  Location: [facility];  Service: Orthopedics;  Laterality: Left;  LEFT TOTAL SHOULDER ARTHROPLASTY  . TOTAL SHOULDER ARTHROPLASTY Right 12/22/2013  . TOTAL SHOULDER ARTHROPLASTY Right 12/22/2013   Procedure: TOTAL SHOULDER ARTHROPLASTY;  Surgeon: Nita Sells, MD;  Location: [facility];  Service: Orthopedics;  Laterality: Right;  Right shoulder total arthroplasty  . TUBAL LIGATION  1985    OB History    Gravida Para Term Preterm AB Living   7 6 6     6    SAB TAB Ectopic Multiple Live Births                   Home Medications    Prior to Admission medications   Medication Sig Start Date End Date Taking? Authorizing Provider  Diethylpropion HCl 25 MG TABS Take 75 mg by mouth daily.   Yes Historical Provider, MD  oxyCODONE-acetaminophen (PERCOCET) 10-325 MG per tablet Take 1 tablet by mouth 3 (three) times daily.    Yes Historical Provider, MD  telmisartan (MICARDIS) 40 MG tablet Take 20 mg by mouth daily.    Yes Historical Provider, MD  thyroid (ARMOUR) 60 MG tablet Take 120 mg by mouth daily before breakfast.   Yes Historical Provider, MD  amoxicillin (AMOXIL) 500 MG capsule Take 1 capsule (500 mg total) by mouth 2 (two) times daily.  For 10 days 08/22/15   Noland Fordyce, PA-C  Ascorbic Acid (VITAMIN C PO) Take 1 tablet by mouth daily.    Historical Provider, MD  B Complex Vitamins (VITAMIN-B COMPLEX PO) Take 1 tablet by mouth daily.    Historical Provider, MD  BIOTIN 5000 PO Take 5,000 Units by mouth 2 (two) times a week.     Historical Provider, MD  fluticasone (FLONASE) 50 MCG/ACT nasal spray Place 2 sprays into both nostrils daily. 08/22/15   Noland Fordyce, PA-C  Magnesium 250 MG TABS Take 250 mg by mouth 2 (two) times daily.    Historical Provider, MD  Multiple Vitamins-Minerals (MULTIVITAMIN PO) Take 1 tablet by mouth daily.    Historical Provider, MD  OVER THE COUNTER MEDICATION Take 50 mg  by mouth 2 (two) times a week. DHEA 50 mg    Historical Provider, MD  OxyCODONE (OXYCONTIN) 20 mg T12A 12 hr tablet Take 1 tablet (20 mg total) by mouth every 8 (eight) hours. Patient taking differently: Take 40 mg by mouth every 8 (eight) hours.  12/23/13   Grier Mitts, PA-C  phenazopyridine (PYRIDIUM) 200 MG tablet Take 1 tablet by mouth every 6-8 hours if needed for urinary pain 07/29/14   Jacqulyn Cane, MD  Vitamin D, Ergocalciferol, (DRISDOL) 50000 UNITS CAPS capsule Take 50,000 Units by mouth every 7 (seven) days.    Historical Provider, MD    Family History Family History  Problem Relation Age of Onset  . Cancer Mother 60    pancratic  . Cancer Father 6    hematologic malignancy   . Arthritis Sister   . Arthritis Sister   . Hypertension      Family history  . Thyroid disease      family history    Social History Social History  Substance Use Topics  . Smoking status: Former Smoker    Packs/day: 1.00    Years: 7.00    Types: Cigarettes    Quit date: 03/03/1970  . Smokeless tobacco: Never Used  . Alcohol use No     Allergies   Codeine and Doxycycline   Review of Systems Review of Systems  Constitutional: Negative for diaphoresis and unexpected weight change.  Eyes: Negative for visual disturbance.  Respiratory: Negative for shortness of breath.   Cardiovascular: Negative for chest pain, palpitations and leg swelling.  Gastrointestinal: Negative for abdominal pain and vomiting.  Musculoskeletal: Negative for gait problem.  Neurological: Negative for dizziness, tremors, syncope, facial asymmetry, speech difficulty, weakness, light-headedness, numbness and headaches.  All other systems reviewed and are negative.    Physical Exam Updated Vital Signs BP 161/84 (BP Location: Right Arm)   Pulse 96   Temp 98.5 F (36.9 C) (Oral)   Resp 20   Wt 231 lb (104.8 kg)   SpO2 100%   BMI 36.18 kg/m   Physical Exam  Constitutional: She is oriented to person,  place, and time. She appears well-developed and well-nourished. No distress.  HENT:  Head: Normocephalic and atraumatic.  Mouth/Throat: No oropharyngeal exudate.  Eyes: Conjunctivae and EOM are normal. Pupils are equal, round, and reactive to light.  Neck: Normal range of motion. Neck supple. No JVD present. Carotid bruit is not present.  Cardiovascular: Normal rate, regular rhythm, normal heart sounds and intact distal pulses.   Pulmonary/Chest: Effort normal and breath sounds normal. No stridor. She has no wheezes. She has no rales.  Abdominal: Soft. Bowel sounds are normal. She exhibits no mass. There is no tenderness. There is  no guarding.  Musculoskeletal: Normal range of motion. She exhibits no edema.  Neurological: She is alert and oriented to person, place, and time. She has normal reflexes. No cranial nerve deficit.  Gait steady (witnessed as patient eloped)  Skin: Skin is warm and dry. Capillary refill takes less than 2 seconds.  Psychiatric: Her mood appears anxious.     ED Treatments / Results   Vitals:   12/04/15 2345 12/05/15 0032  BP: 186/85 161/84  Pulse: 84 96  Resp: 20 20  Temp: 98.5 F (36.9 C)     Radiology No results found.  Procedures Procedures (including critical care time)  Medications Ordered in ED Medications - No data to display   Initial Impression / Assessment and Plan / ED Course  I have reviewed the triage vital signs and the nursing notes.  Pertinent labs & imaging results that were available during my care of the patient were reviewed by me and considered in my medical decision making (see chart for details).  Clinical Course    EDP examined patient.  We talked at length about stress reduction and lowering sodium in her diet and how taking too much medication can be dangerous in the short term as sudden lowering of BP can cause stroke and or syncope secondary to decrease in cerebral perfusion pressure.  Patient states she has not  complaints as this time and is only here because she called in to her PMD office and based on her BP at home they told her to come in for evaluation.  We had a nice, cordial discussion and patient was at ease. EDP stated the patient should follow up with her PMD this week to discuss her BP and home stress.  EDP then stated that we would recheck her BP and would order EKG, blood work, urinalysis in order to evaluate BP and ensure that patient is healthy.  Nurse tech went to repeat BP.  Patient reportedly took off cuff and got up and left.  EDP saw patient just before the door to the ED closed.  Patient eloped without signing AMA  Final Clinical Impressions(s) / ED Diagnoses   Final diagnoses:  Essential hypertension    New Prescriptions New Prescriptions   No medications on file     Siri Buege, MD 12/05/15 [phone]

## 2015-12-05 NOTE — ED Notes (Signed)
Pt c/o pain in back of neck and back of head.  She has been under a lot of stress.  She denies SOB, denies CP, denies peripheral edema.

## 2015-12-05 NOTE — ED Notes (Signed)
Pt seen walking out of department, asked to wait and pt states, "nope, I don't have time to wait.  You all have my information."

## 2016-01-03 DIAGNOSIS — G894 Chronic pain syndrome: Secondary | ICD-10-CM | POA: Diagnosis not present

## 2016-01-03 DIAGNOSIS — M25551 Pain in right hip: Secondary | ICD-10-CM | POA: Diagnosis not present

## 2016-01-03 DIAGNOSIS — Z79891 Long term (current) use of opiate analgesic: Secondary | ICD-10-CM | POA: Diagnosis not present

## 2016-01-03 DIAGNOSIS — M150 Primary generalized (osteo)arthritis: Secondary | ICD-10-CM | POA: Diagnosis not present

## 2016-01-07 DIAGNOSIS — M79606 Pain in leg, unspecified: Secondary | ICD-10-CM | POA: Diagnosis not present

## 2016-01-07 DIAGNOSIS — Z6835 Body mass index (BMI) 35.0-35.9, adult: Secondary | ICD-10-CM | POA: Diagnosis not present

## 2016-01-07 DIAGNOSIS — I10 Essential (primary) hypertension: Secondary | ICD-10-CM | POA: Diagnosis not present

## 2016-01-07 DIAGNOSIS — R05 Cough: Secondary | ICD-10-CM | POA: Diagnosis not present

## 2016-01-16 DIAGNOSIS — M5432 Sciatica, left side: Secondary | ICD-10-CM | POA: Diagnosis not present

## 2016-01-16 DIAGNOSIS — J040 Acute laryngitis: Secondary | ICD-10-CM | POA: Diagnosis not present

## 2016-01-22 DIAGNOSIS — M25552 Pain in left hip: Secondary | ICD-10-CM | POA: Diagnosis not present

## 2016-01-31 DIAGNOSIS — M150 Primary generalized (osteo)arthritis: Secondary | ICD-10-CM | POA: Diagnosis not present

## 2016-01-31 DIAGNOSIS — G894 Chronic pain syndrome: Secondary | ICD-10-CM | POA: Diagnosis not present

## 2016-01-31 DIAGNOSIS — M25551 Pain in right hip: Secondary | ICD-10-CM | POA: Diagnosis not present

## 2016-01-31 DIAGNOSIS — Z79891 Long term (current) use of opiate analgesic: Secondary | ICD-10-CM | POA: Diagnosis not present

## 2016-02-15 IMAGING — CT CT SHOULDER*R* W/O CM
3 series · 8 of 14 positions shown, 9 images · non-contrast
Comparison: None.

CLINICAL DATA: Osteoarthritis of the right shoulder with decreased
range of motion. M19.019.

EXAM:
CT OF THE RIGHT SHOULDER WITHOUT CONTRAST
TECHNIQUE: Multidetector CT imaging was performed according to the standard
protocol. Multiplanar CT image reconstructions were also generated.

[Series 5: shoulder soft · axial · 0.54mm/px · z∈[-151,-86]mm · 2 of 78 slices shown, 3 images]
[im 26/78  soft-tissue]
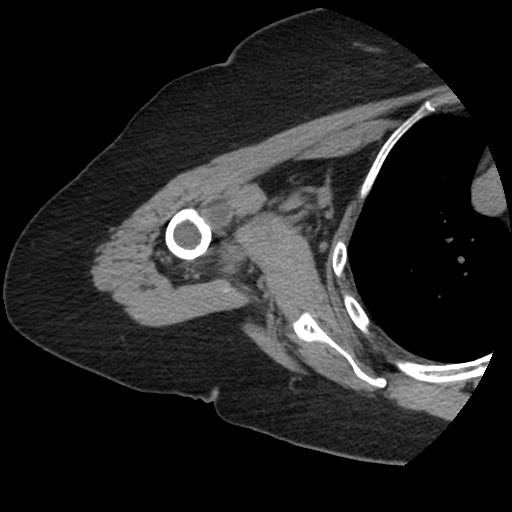
[im 26/78  bone]
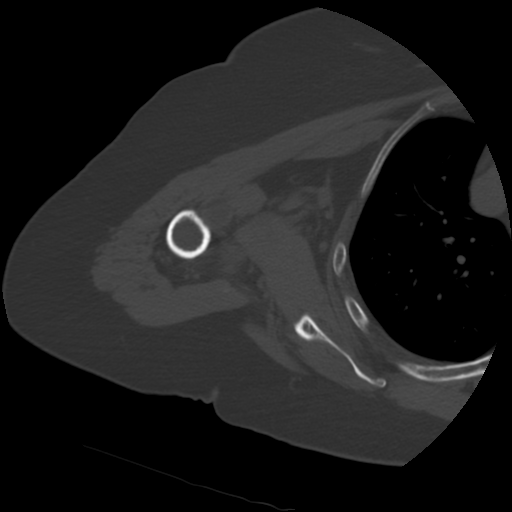
[im 52/78  bone]
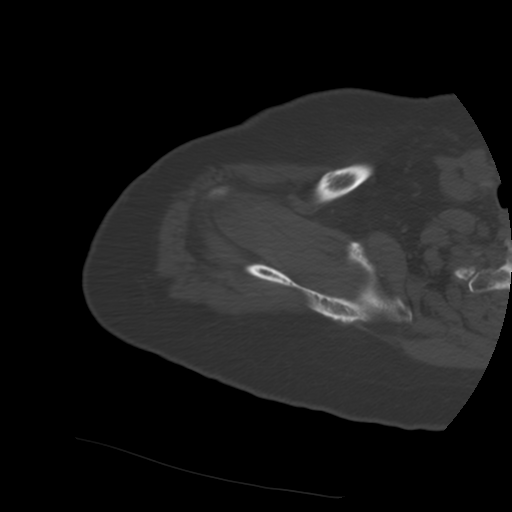

[Series 300: cor soft · oblique · 0.44mm/px · 3 of 109 slices shown]
[im 28/109  soft-tissue]
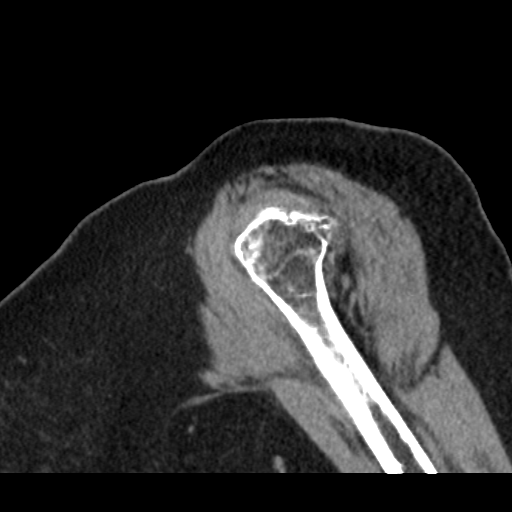
[im 55/109  soft-tissue]
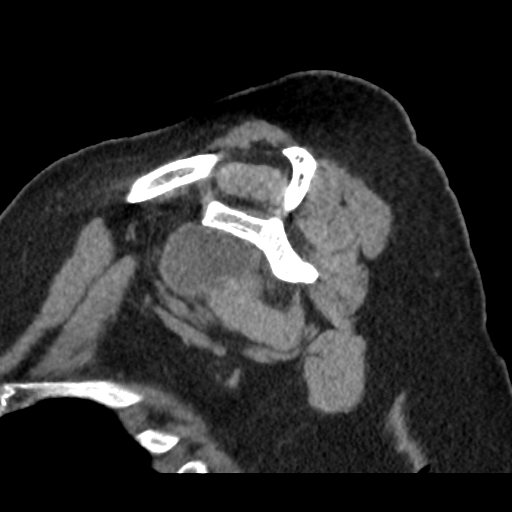
[im 82/109  soft-tissue]
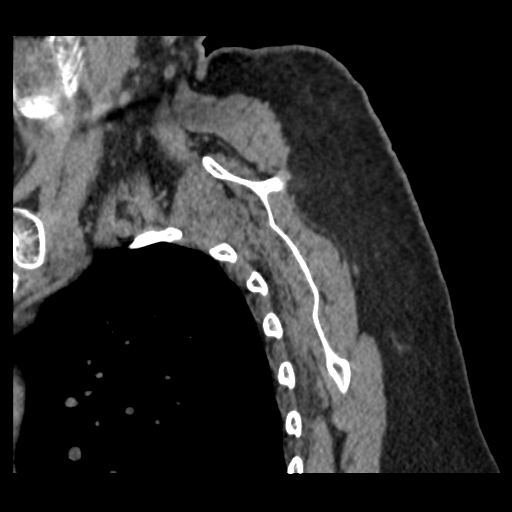

[Series 301: obl cor soft · oblique · 0.44mm/px · 3 of 115 slices shown]
[im 29/115  soft-tissue]
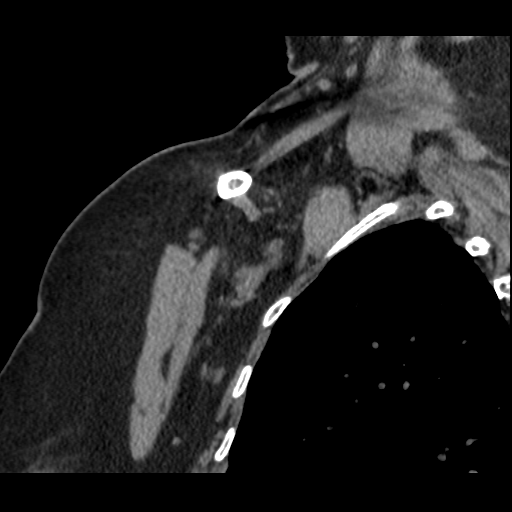
[im 58/115  soft-tissue]
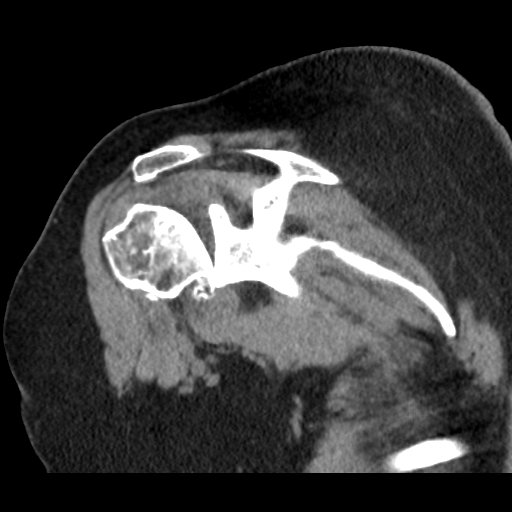
[im 86/115  soft-tissue]
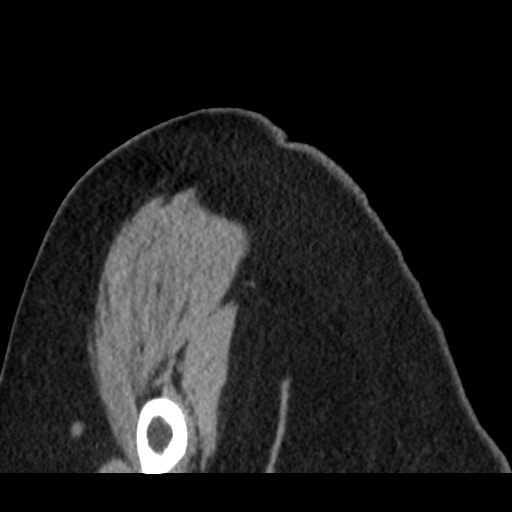

[8 of 14 positions shown; findings below may reference images not displayed]

FINDINGS: There is severe osteoarthritis of the glenohumeral joint with
diffuse full-thickness cartilage loss and subchondral cyst formation
in the glenoid and humeral head with prominent marginal osteophytes
on the humeral head. There is a large joint effusion. There is
extensive fluid and what is probably debris in the bicipital tendon
sheath.

The long head of the biceps tendon is not discretely identified and
may be torn.

Minimal degenerative changes of the acromioclavicular joint. The
rotator cuff appears to be intact. No visible bursitis. No atrophy
of the muscles of the rotator cuff.
IMPRESSION: 1. Severe osteoarthritis of the glenohumeral joint with a prominent
joint effusion.
2. Probable avulsion and distal retraction of the long head of the
biceps tendon.
3. Intact rotator cuff.

## 2016-02-28 DIAGNOSIS — M150 Primary generalized (osteo)arthritis: Secondary | ICD-10-CM | POA: Diagnosis not present

## 2016-02-28 DIAGNOSIS — Z79891 Long term (current) use of opiate analgesic: Secondary | ICD-10-CM | POA: Diagnosis not present

## 2016-02-28 DIAGNOSIS — G894 Chronic pain syndrome: Secondary | ICD-10-CM | POA: Diagnosis not present

## 2016-02-28 DIAGNOSIS — M25551 Pain in right hip: Secondary | ICD-10-CM | POA: Diagnosis not present

## 2016-03-27 DIAGNOSIS — M150 Primary generalized (osteo)arthritis: Secondary | ICD-10-CM | POA: Diagnosis not present

## 2016-03-27 DIAGNOSIS — G894 Chronic pain syndrome: Secondary | ICD-10-CM | POA: Diagnosis not present

## 2016-03-27 DIAGNOSIS — Z79891 Long term (current) use of opiate analgesic: Secondary | ICD-10-CM | POA: Diagnosis not present

## 2016-03-27 DIAGNOSIS — M25551 Pain in right hip: Secondary | ICD-10-CM | POA: Diagnosis not present

## 2016-04-24 DIAGNOSIS — Z79891 Long term (current) use of opiate analgesic: Secondary | ICD-10-CM | POA: Diagnosis not present

## 2016-04-24 DIAGNOSIS — G894 Chronic pain syndrome: Secondary | ICD-10-CM | POA: Diagnosis not present

## 2016-04-24 DIAGNOSIS — M25551 Pain in right hip: Secondary | ICD-10-CM | POA: Diagnosis not present

## 2016-04-24 DIAGNOSIS — M150 Primary generalized (osteo)arthritis: Secondary | ICD-10-CM | POA: Diagnosis not present

## 2016-04-30 DIAGNOSIS — Z029 Encounter for administrative examinations, unspecified: Secondary | ICD-10-CM | POA: Diagnosis not present

## 2016-05-06 DIAGNOSIS — H40013 Open angle with borderline findings, low risk, bilateral: Secondary | ICD-10-CM | POA: Diagnosis not present

## 2016-05-21 DIAGNOSIS — R42 Dizziness and giddiness: Secondary | ICD-10-CM | POA: Diagnosis not present

## 2016-05-22 DIAGNOSIS — R42 Dizziness and giddiness: Secondary | ICD-10-CM | POA: Diagnosis not present

## 2016-05-23 DIAGNOSIS — H9201 Otalgia, right ear: Secondary | ICD-10-CM | POA: Diagnosis not present

## 2016-05-23 DIAGNOSIS — J320 Chronic maxillary sinusitis: Secondary | ICD-10-CM | POA: Diagnosis not present

## 2016-06-17 DIAGNOSIS — G894 Chronic pain syndrome: Secondary | ICD-10-CM | POA: Diagnosis not present

## 2016-06-17 DIAGNOSIS — M25551 Pain in right hip: Secondary | ICD-10-CM | POA: Diagnosis not present

## 2016-06-17 DIAGNOSIS — M150 Primary generalized (osteo)arthritis: Secondary | ICD-10-CM | POA: Diagnosis not present

## 2016-06-17 DIAGNOSIS — Z79891 Long term (current) use of opiate analgesic: Secondary | ICD-10-CM | POA: Diagnosis not present

## 2016-07-10 DIAGNOSIS — J019 Acute sinusitis, unspecified: Secondary | ICD-10-CM | POA: Diagnosis not present

## 2016-07-11 DIAGNOSIS — Z79891 Long term (current) use of opiate analgesic: Secondary | ICD-10-CM | POA: Diagnosis not present

## 2016-07-11 DIAGNOSIS — G894 Chronic pain syndrome: Secondary | ICD-10-CM | POA: Diagnosis not present

## 2016-07-11 DIAGNOSIS — M150 Primary generalized (osteo)arthritis: Secondary | ICD-10-CM | POA: Diagnosis not present

## 2016-07-18 DIAGNOSIS — Z6834 Body mass index (BMI) 34.0-34.9, adult: Secondary | ICD-10-CM | POA: Diagnosis not present

## 2016-07-18 DIAGNOSIS — J329 Chronic sinusitis, unspecified: Secondary | ICD-10-CM | POA: Diagnosis not present

## 2016-07-18 DIAGNOSIS — E669 Obesity, unspecified: Secondary | ICD-10-CM | POA: Diagnosis not present

## 2016-07-21 ENCOUNTER — Ambulatory Visit (INDEPENDENT_AMBULATORY_CARE_PROVIDER_SITE_OTHER): Payer: Medicare Other | Admitting: Physician Assistant

## 2016-07-31 DIAGNOSIS — E039 Hypothyroidism, unspecified: Secondary | ICD-10-CM | POA: Diagnosis not present

## 2016-08-12 DIAGNOSIS — Z79891 Long term (current) use of opiate analgesic: Secondary | ICD-10-CM | POA: Diagnosis not present

## 2016-08-12 DIAGNOSIS — G894 Chronic pain syndrome: Secondary | ICD-10-CM | POA: Diagnosis not present

## 2016-08-12 DIAGNOSIS — M150 Primary generalized (osteo)arthritis: Secondary | ICD-10-CM | POA: Diagnosis not present

## 2016-10-07 DIAGNOSIS — M150 Primary generalized (osteo)arthritis: Secondary | ICD-10-CM | POA: Diagnosis not present

## 2016-10-07 DIAGNOSIS — Z79891 Long term (current) use of opiate analgesic: Secondary | ICD-10-CM | POA: Diagnosis not present

## 2016-10-07 DIAGNOSIS — G894 Chronic pain syndrome: Secondary | ICD-10-CM | POA: Diagnosis not present

## 2016-11-06 DIAGNOSIS — M150 Primary generalized (osteo)arthritis: Secondary | ICD-10-CM | POA: Diagnosis not present

## 2016-11-06 DIAGNOSIS — G894 Chronic pain syndrome: Secondary | ICD-10-CM | POA: Diagnosis not present

## 2016-11-06 DIAGNOSIS — Z79891 Long term (current) use of opiate analgesic: Secondary | ICD-10-CM | POA: Diagnosis not present

## 2016-12-03 DIAGNOSIS — L821 Other seborrheic keratosis: Secondary | ICD-10-CM | POA: Diagnosis not present

## 2016-12-03 DIAGNOSIS — L814 Other melanin hyperpigmentation: Secondary | ICD-10-CM | POA: Diagnosis not present

## 2016-12-03 DIAGNOSIS — L570 Actinic keratosis: Secondary | ICD-10-CM | POA: Diagnosis not present

## 2016-12-04 DIAGNOSIS — Z79891 Long term (current) use of opiate analgesic: Secondary | ICD-10-CM | POA: Diagnosis not present

## 2016-12-04 DIAGNOSIS — M150 Primary generalized (osteo)arthritis: Secondary | ICD-10-CM | POA: Diagnosis not present

## 2016-12-04 DIAGNOSIS — G894 Chronic pain syndrome: Secondary | ICD-10-CM | POA: Diagnosis not present

## 2016-12-05 DIAGNOSIS — E039 Hypothyroidism, unspecified: Secondary | ICD-10-CM | POA: Diagnosis not present

## 2016-12-05 DIAGNOSIS — I10 Essential (primary) hypertension: Secondary | ICD-10-CM | POA: Diagnosis not present

## 2016-12-05 DIAGNOSIS — E7800 Pure hypercholesterolemia, unspecified: Secondary | ICD-10-CM | POA: Diagnosis not present

## 2016-12-05 DIAGNOSIS — R5383 Other fatigue: Secondary | ICD-10-CM | POA: Diagnosis not present

## 2016-12-05 DIAGNOSIS — Z1389 Encounter for screening for other disorder: Secondary | ICD-10-CM | POA: Diagnosis not present

## 2016-12-05 DIAGNOSIS — R7309 Other abnormal glucose: Secondary | ICD-10-CM | POA: Diagnosis not present

## 2016-12-05 DIAGNOSIS — E559 Vitamin D deficiency, unspecified: Secondary | ICD-10-CM | POA: Diagnosis not present

## 2016-12-09 DIAGNOSIS — E039 Hypothyroidism, unspecified: Secondary | ICD-10-CM | POA: Diagnosis not present

## 2016-12-09 DIAGNOSIS — I10 Essential (primary) hypertension: Secondary | ICD-10-CM | POA: Diagnosis not present

## 2016-12-09 DIAGNOSIS — E7800 Pure hypercholesterolemia, unspecified: Secondary | ICD-10-CM | POA: Diagnosis not present

## 2016-12-09 DIAGNOSIS — E559 Vitamin D deficiency, unspecified: Secondary | ICD-10-CM | POA: Diagnosis not present

## 2016-12-09 DIAGNOSIS — Z6835 Body mass index (BMI) 35.0-35.9, adult: Secondary | ICD-10-CM | POA: Diagnosis not present

## 2016-12-09 DIAGNOSIS — R7309 Other abnormal glucose: Secondary | ICD-10-CM | POA: Diagnosis not present

## 2016-12-09 DIAGNOSIS — R5383 Other fatigue: Secondary | ICD-10-CM | POA: Diagnosis not present

## 2017-01-07 DIAGNOSIS — Z79891 Long term (current) use of opiate analgesic: Secondary | ICD-10-CM | POA: Diagnosis not present

## 2017-01-07 DIAGNOSIS — M150 Primary generalized (osteo)arthritis: Secondary | ICD-10-CM | POA: Diagnosis not present

## 2017-01-07 DIAGNOSIS — G894 Chronic pain syndrome: Secondary | ICD-10-CM | POA: Diagnosis not present

## 2017-01-16 DIAGNOSIS — E039 Hypothyroidism, unspecified: Secondary | ICD-10-CM | POA: Diagnosis not present

## 2017-02-06 DIAGNOSIS — R5382 Chronic fatigue, unspecified: Secondary | ICD-10-CM | POA: Diagnosis not present

## 2017-02-06 DIAGNOSIS — G894 Chronic pain syndrome: Secondary | ICD-10-CM | POA: Diagnosis not present

## 2017-02-06 DIAGNOSIS — M150 Primary generalized (osteo)arthritis: Secondary | ICD-10-CM | POA: Diagnosis not present

## 2017-02-06 DIAGNOSIS — Z79891 Long term (current) use of opiate analgesic: Secondary | ICD-10-CM | POA: Diagnosis not present

## 2017-03-04 DIAGNOSIS — M150 Primary generalized (osteo)arthritis: Secondary | ICD-10-CM | POA: Diagnosis not present

## 2017-03-04 DIAGNOSIS — G894 Chronic pain syndrome: Secondary | ICD-10-CM | POA: Diagnosis not present

## 2017-03-04 DIAGNOSIS — Z79891 Long term (current) use of opiate analgesic: Secondary | ICD-10-CM | POA: Diagnosis not present

## 2017-03-04 DIAGNOSIS — M25551 Pain in right hip: Secondary | ICD-10-CM | POA: Diagnosis not present

## 2017-03-23 DIAGNOSIS — J329 Chronic sinusitis, unspecified: Secondary | ICD-10-CM | POA: Diagnosis not present

## 2017-04-08 DIAGNOSIS — G894 Chronic pain syndrome: Secondary | ICD-10-CM | POA: Diagnosis not present

## 2017-04-08 DIAGNOSIS — M25551 Pain in right hip: Secondary | ICD-10-CM | POA: Diagnosis not present

## 2017-04-08 DIAGNOSIS — Z79891 Long term (current) use of opiate analgesic: Secondary | ICD-10-CM | POA: Diagnosis not present

## 2017-04-08 DIAGNOSIS — M150 Primary generalized (osteo)arthritis: Secondary | ICD-10-CM | POA: Diagnosis not present

## 2017-04-13 DIAGNOSIS — E039 Hypothyroidism, unspecified: Secondary | ICD-10-CM | POA: Diagnosis not present

## 2017-04-13 DIAGNOSIS — R531 Weakness: Secondary | ICD-10-CM | POA: Diagnosis not present

## 2017-04-13 DIAGNOSIS — E559 Vitamin D deficiency, unspecified: Secondary | ICD-10-CM | POA: Diagnosis not present

## 2017-04-13 DIAGNOSIS — R5381 Other malaise: Secondary | ICD-10-CM | POA: Diagnosis not present

## 2017-04-13 DIAGNOSIS — R5383 Other fatigue: Secondary | ICD-10-CM | POA: Diagnosis not present

## 2017-04-16 DIAGNOSIS — M7061 Trochanteric bursitis, right hip: Secondary | ICD-10-CM | POA: Diagnosis not present

## 2017-05-08 DIAGNOSIS — G894 Chronic pain syndrome: Secondary | ICD-10-CM | POA: Diagnosis not present

## 2017-05-08 DIAGNOSIS — Z79891 Long term (current) use of opiate analgesic: Secondary | ICD-10-CM | POA: Diagnosis not present

## 2017-05-08 DIAGNOSIS — M25551 Pain in right hip: Secondary | ICD-10-CM | POA: Diagnosis not present

## 2017-05-08 DIAGNOSIS — M150 Primary generalized (osteo)arthritis: Secondary | ICD-10-CM | POA: Diagnosis not present

## 2017-05-19 DIAGNOSIS — J069 Acute upper respiratory infection, unspecified: Secondary | ICD-10-CM | POA: Diagnosis not present

## 2017-05-19 DIAGNOSIS — R509 Fever, unspecified: Secondary | ICD-10-CM | POA: Diagnosis not present

## 2017-05-25 DIAGNOSIS — Z1231 Encounter for screening mammogram for malignant neoplasm of breast: Secondary | ICD-10-CM | POA: Diagnosis not present

## 2017-05-26 DIAGNOSIS — R05 Cough: Secondary | ICD-10-CM | POA: Diagnosis not present

## 2017-05-26 DIAGNOSIS — R0789 Other chest pain: Secondary | ICD-10-CM | POA: Diagnosis not present

## 2017-06-04 DIAGNOSIS — M150 Primary generalized (osteo)arthritis: Secondary | ICD-10-CM | POA: Diagnosis not present

## 2017-06-04 DIAGNOSIS — M25551 Pain in right hip: Secondary | ICD-10-CM | POA: Diagnosis not present

## 2017-06-04 DIAGNOSIS — G894 Chronic pain syndrome: Secondary | ICD-10-CM | POA: Diagnosis not present

## 2017-06-04 DIAGNOSIS — Z79891 Long term (current) use of opiate analgesic: Secondary | ICD-10-CM | POA: Diagnosis not present

## 2017-06-15 DIAGNOSIS — R300 Dysuria: Secondary | ICD-10-CM | POA: Diagnosis not present

## 2017-06-15 DIAGNOSIS — R0789 Other chest pain: Secondary | ICD-10-CM | POA: Diagnosis not present

## 2017-06-15 DIAGNOSIS — F4320 Adjustment disorder, unspecified: Secondary | ICD-10-CM | POA: Diagnosis not present

## 2017-06-15 DIAGNOSIS — N390 Urinary tract infection, site not specified: Secondary | ICD-10-CM | POA: Diagnosis not present

## 2017-06-25 DIAGNOSIS — M25561 Pain in right knee: Secondary | ICD-10-CM | POA: Diagnosis not present

## 2017-07-03 DIAGNOSIS — M25551 Pain in right hip: Secondary | ICD-10-CM | POA: Diagnosis not present

## 2017-07-03 DIAGNOSIS — G894 Chronic pain syndrome: Secondary | ICD-10-CM | POA: Diagnosis not present

## 2017-07-03 DIAGNOSIS — M150 Primary generalized (osteo)arthritis: Secondary | ICD-10-CM | POA: Diagnosis not present

## 2017-07-03 DIAGNOSIS — Z79891 Long term (current) use of opiate analgesic: Secondary | ICD-10-CM | POA: Diagnosis not present

## 2017-07-28 DIAGNOSIS — L720 Epidermal cyst: Secondary | ICD-10-CM | POA: Diagnosis not present

## 2017-07-28 DIAGNOSIS — L821 Other seborrheic keratosis: Secondary | ICD-10-CM | POA: Diagnosis not present

## 2017-08-03 DIAGNOSIS — G894 Chronic pain syndrome: Secondary | ICD-10-CM | POA: Diagnosis not present

## 2017-08-03 DIAGNOSIS — Z79891 Long term (current) use of opiate analgesic: Secondary | ICD-10-CM | POA: Diagnosis not present

## 2017-08-03 DIAGNOSIS — M150 Primary generalized (osteo)arthritis: Secondary | ICD-10-CM | POA: Diagnosis not present

## 2017-08-03 DIAGNOSIS — M25551 Pain in right hip: Secondary | ICD-10-CM | POA: Diagnosis not present

## 2017-08-10 DIAGNOSIS — Z6821 Body mass index (BMI) 21.0-21.9, adult: Secondary | ICD-10-CM | POA: Diagnosis not present

## 2017-08-10 DIAGNOSIS — Z01419 Encounter for gynecological examination (general) (routine) without abnormal findings: Secondary | ICD-10-CM | POA: Diagnosis not present

## 2017-09-01 DIAGNOSIS — G894 Chronic pain syndrome: Secondary | ICD-10-CM | POA: Diagnosis not present

## 2017-09-01 DIAGNOSIS — Z79891 Long term (current) use of opiate analgesic: Secondary | ICD-10-CM | POA: Diagnosis not present

## 2017-09-01 DIAGNOSIS — M150 Primary generalized (osteo)arthritis: Secondary | ICD-10-CM | POA: Diagnosis not present

## 2017-09-01 DIAGNOSIS — M25551 Pain in right hip: Secondary | ICD-10-CM | POA: Diagnosis not present

## 2017-09-30 DIAGNOSIS — Z79891 Long term (current) use of opiate analgesic: Secondary | ICD-10-CM | POA: Diagnosis not present

## 2017-09-30 DIAGNOSIS — G894 Chronic pain syndrome: Secondary | ICD-10-CM | POA: Diagnosis not present

## 2017-09-30 DIAGNOSIS — M150 Primary generalized (osteo)arthritis: Secondary | ICD-10-CM | POA: Diagnosis not present

## 2017-10-28 DIAGNOSIS — M150 Primary generalized (osteo)arthritis: Secondary | ICD-10-CM | POA: Diagnosis not present

## 2017-10-28 DIAGNOSIS — Z79891 Long term (current) use of opiate analgesic: Secondary | ICD-10-CM | POA: Diagnosis not present

## 2017-10-28 DIAGNOSIS — G894 Chronic pain syndrome: Secondary | ICD-10-CM | POA: Diagnosis not present

## 2017-11-03 DIAGNOSIS — L821 Other seborrheic keratosis: Secondary | ICD-10-CM | POA: Diagnosis not present

## 2017-11-03 DIAGNOSIS — Z08 Encounter for follow-up examination after completed treatment for malignant neoplasm: Secondary | ICD-10-CM | POA: Diagnosis not present

## 2017-11-03 DIAGNOSIS — Z85828 Personal history of other malignant neoplasm of skin: Secondary | ICD-10-CM | POA: Diagnosis not present

## 2017-11-16 DIAGNOSIS — R42 Dizziness and giddiness: Secondary | ICD-10-CM | POA: Diagnosis not present

## 2017-11-25 DIAGNOSIS — M150 Primary generalized (osteo)arthritis: Secondary | ICD-10-CM | POA: Diagnosis not present

## 2017-11-25 DIAGNOSIS — Z79891 Long term (current) use of opiate analgesic: Secondary | ICD-10-CM | POA: Diagnosis not present

## 2017-11-25 DIAGNOSIS — G894 Chronic pain syndrome: Secondary | ICD-10-CM | POA: Diagnosis not present

## 2017-12-04 DIAGNOSIS — R350 Frequency of micturition: Secondary | ICD-10-CM | POA: Diagnosis not present

## 2017-12-04 DIAGNOSIS — R5383 Other fatigue: Secondary | ICD-10-CM | POA: Diagnosis not present

## 2017-12-04 DIAGNOSIS — E039 Hypothyroidism, unspecified: Secondary | ICD-10-CM | POA: Diagnosis not present

## 2017-12-04 DIAGNOSIS — R7309 Other abnormal glucose: Secondary | ICD-10-CM | POA: Diagnosis not present

## 2017-12-04 DIAGNOSIS — R51 Headache: Secondary | ICD-10-CM | POA: Diagnosis not present

## 2017-12-04 DIAGNOSIS — I10 Essential (primary) hypertension: Secondary | ICD-10-CM | POA: Diagnosis not present

## 2017-12-04 DIAGNOSIS — H538 Other visual disturbances: Secondary | ICD-10-CM | POA: Diagnosis not present

## 2017-12-04 DIAGNOSIS — H6980 Other specified disorders of Eustachian tube, unspecified ear: Secondary | ICD-10-CM | POA: Diagnosis not present

## 2017-12-04 DIAGNOSIS — E559 Vitamin D deficiency, unspecified: Secondary | ICD-10-CM | POA: Diagnosis not present

## 2017-12-23 DIAGNOSIS — M150 Primary generalized (osteo)arthritis: Secondary | ICD-10-CM | POA: Diagnosis not present

## 2017-12-23 DIAGNOSIS — G894 Chronic pain syndrome: Secondary | ICD-10-CM | POA: Diagnosis not present

## 2017-12-23 DIAGNOSIS — Z79891 Long term (current) use of opiate analgesic: Secondary | ICD-10-CM | POA: Diagnosis not present

## 2018-01-21 DIAGNOSIS — M150 Primary generalized (osteo)arthritis: Secondary | ICD-10-CM | POA: Diagnosis not present

## 2018-01-21 DIAGNOSIS — Z79891 Long term (current) use of opiate analgesic: Secondary | ICD-10-CM | POA: Diagnosis not present

## 2018-01-21 DIAGNOSIS — G894 Chronic pain syndrome: Secondary | ICD-10-CM | POA: Diagnosis not present

## 2018-02-17 DIAGNOSIS — R300 Dysuria: Secondary | ICD-10-CM | POA: Diagnosis not present

## 2018-02-17 DIAGNOSIS — R8290 Unspecified abnormal findings in urine: Secondary | ICD-10-CM | POA: Diagnosis not present

## 2018-02-19 DIAGNOSIS — Z79891 Long term (current) use of opiate analgesic: Secondary | ICD-10-CM | POA: Diagnosis not present

## 2018-02-19 DIAGNOSIS — G894 Chronic pain syndrome: Secondary | ICD-10-CM | POA: Diagnosis not present

## 2018-02-19 DIAGNOSIS — M150 Primary generalized (osteo)arthritis: Secondary | ICD-10-CM | POA: Diagnosis not present

## 2018-03-25 DIAGNOSIS — M150 Primary generalized (osteo)arthritis: Secondary | ICD-10-CM | POA: Diagnosis not present

## 2018-03-25 DIAGNOSIS — G894 Chronic pain syndrome: Secondary | ICD-10-CM | POA: Diagnosis not present

## 2018-03-25 DIAGNOSIS — Z79891 Long term (current) use of opiate analgesic: Secondary | ICD-10-CM | POA: Diagnosis not present

## 2018-04-22 DIAGNOSIS — Z79891 Long term (current) use of opiate analgesic: Secondary | ICD-10-CM | POA: Diagnosis not present

## 2018-04-22 DIAGNOSIS — M150 Primary generalized (osteo)arthritis: Secondary | ICD-10-CM | POA: Diagnosis not present

## 2018-04-22 DIAGNOSIS — G894 Chronic pain syndrome: Secondary | ICD-10-CM | POA: Diagnosis not present

## 2018-04-30 DIAGNOSIS — R8290 Unspecified abnormal findings in urine: Secondary | ICD-10-CM | POA: Diagnosis not present

## 2018-04-30 DIAGNOSIS — R109 Unspecified abdominal pain: Secondary | ICD-10-CM | POA: Diagnosis not present

## 2018-05-18 DIAGNOSIS — M150 Primary generalized (osteo)arthritis: Secondary | ICD-10-CM | POA: Diagnosis not present

## 2018-05-18 DIAGNOSIS — G5602 Carpal tunnel syndrome, left upper limb: Secondary | ICD-10-CM | POA: Diagnosis not present

## 2018-05-18 DIAGNOSIS — Z79891 Long term (current) use of opiate analgesic: Secondary | ICD-10-CM | POA: Diagnosis not present

## 2018-05-18 DIAGNOSIS — G894 Chronic pain syndrome: Secondary | ICD-10-CM | POA: Diagnosis not present

## 2018-06-16 DIAGNOSIS — Z79891 Long term (current) use of opiate analgesic: Secondary | ICD-10-CM | POA: Diagnosis not present

## 2018-06-16 DIAGNOSIS — M47816 Spondylosis without myelopathy or radiculopathy, lumbar region: Secondary | ICD-10-CM | POA: Diagnosis not present

## 2018-06-16 DIAGNOSIS — G894 Chronic pain syndrome: Secondary | ICD-10-CM | POA: Diagnosis not present

## 2018-06-16 DIAGNOSIS — M150 Primary generalized (osteo)arthritis: Secondary | ICD-10-CM | POA: Diagnosis not present

## 2018-06-25 DIAGNOSIS — R42 Dizziness and giddiness: Secondary | ICD-10-CM | POA: Diagnosis not present

## 2018-06-30 DIAGNOSIS — R42 Dizziness and giddiness: Secondary | ICD-10-CM | POA: Diagnosis not present

## 2018-07-12 DIAGNOSIS — J019 Acute sinusitis, unspecified: Secondary | ICD-10-CM | POA: Diagnosis not present

## 2018-07-14 DIAGNOSIS — Z79891 Long term (current) use of opiate analgesic: Secondary | ICD-10-CM | POA: Diagnosis not present

## 2018-07-14 DIAGNOSIS — R42 Dizziness and giddiness: Secondary | ICD-10-CM | POA: Diagnosis not present

## 2018-07-14 DIAGNOSIS — M150 Primary generalized (osteo)arthritis: Secondary | ICD-10-CM | POA: Diagnosis not present

## 2018-07-14 DIAGNOSIS — M47816 Spondylosis without myelopathy or radiculopathy, lumbar region: Secondary | ICD-10-CM | POA: Diagnosis not present

## 2018-07-14 DIAGNOSIS — G894 Chronic pain syndrome: Secondary | ICD-10-CM | POA: Diagnosis not present

## 2018-07-29 DIAGNOSIS — R42 Dizziness and giddiness: Secondary | ICD-10-CM | POA: Diagnosis not present

## 2018-08-11 DIAGNOSIS — M150 Primary generalized (osteo)arthritis: Secondary | ICD-10-CM | POA: Diagnosis not present

## 2018-08-11 DIAGNOSIS — Z79891 Long term (current) use of opiate analgesic: Secondary | ICD-10-CM | POA: Diagnosis not present

## 2018-08-11 DIAGNOSIS — G894 Chronic pain syndrome: Secondary | ICD-10-CM | POA: Diagnosis not present

## 2018-08-11 DIAGNOSIS — M47816 Spondylosis without myelopathy or radiculopathy, lumbar region: Secondary | ICD-10-CM | POA: Diagnosis not present

## 2018-08-16 DIAGNOSIS — J019 Acute sinusitis, unspecified: Secondary | ICD-10-CM | POA: Diagnosis not present

## 2018-08-16 DIAGNOSIS — N3090 Cystitis, unspecified without hematuria: Secondary | ICD-10-CM | POA: Diagnosis not present

## 2018-08-16 DIAGNOSIS — E039 Hypothyroidism, unspecified: Secondary | ICD-10-CM | POA: Diagnosis not present

## 2018-08-16 DIAGNOSIS — R300 Dysuria: Secondary | ICD-10-CM | POA: Diagnosis not present

## 2018-09-09 DIAGNOSIS — Z79891 Long term (current) use of opiate analgesic: Secondary | ICD-10-CM | POA: Diagnosis not present

## 2018-09-09 DIAGNOSIS — M150 Primary generalized (osteo)arthritis: Secondary | ICD-10-CM | POA: Diagnosis not present

## 2018-09-09 DIAGNOSIS — G894 Chronic pain syndrome: Secondary | ICD-10-CM | POA: Diagnosis not present

## 2018-09-09 DIAGNOSIS — M47816 Spondylosis without myelopathy or radiculopathy, lumbar region: Secondary | ICD-10-CM | POA: Diagnosis not present

## 2018-09-27 DIAGNOSIS — E559 Vitamin D deficiency, unspecified: Secondary | ICD-10-CM | POA: Diagnosis not present

## 2018-09-27 DIAGNOSIS — N390 Urinary tract infection, site not specified: Secondary | ICD-10-CM | POA: Diagnosis not present

## 2018-09-27 DIAGNOSIS — R5383 Other fatigue: Secondary | ICD-10-CM | POA: Diagnosis not present

## 2018-09-27 DIAGNOSIS — Z832 Family history of diseases of the blood and blood-forming organs and certain disorders involving the immune mechanism: Secondary | ICD-10-CM | POA: Diagnosis not present

## 2018-09-28 DIAGNOSIS — E8881 Metabolic syndrome: Secondary | ICD-10-CM | POA: Diagnosis not present

## 2018-09-28 DIAGNOSIS — R8290 Unspecified abnormal findings in urine: Secondary | ICD-10-CM | POA: Diagnosis not present

## 2018-09-28 DIAGNOSIS — Z131 Encounter for screening for diabetes mellitus: Secondary | ICD-10-CM | POA: Diagnosis not present

## 2018-09-28 DIAGNOSIS — R0602 Shortness of breath: Secondary | ICD-10-CM | POA: Diagnosis not present

## 2018-09-28 DIAGNOSIS — E559 Vitamin D deficiency, unspecified: Secondary | ICD-10-CM | POA: Diagnosis not present

## 2018-09-28 DIAGNOSIS — Z79899 Other long term (current) drug therapy: Secondary | ICD-10-CM | POA: Diagnosis not present

## 2018-09-28 DIAGNOSIS — R5383 Other fatigue: Secondary | ICD-10-CM | POA: Diagnosis not present

## 2018-09-28 DIAGNOSIS — Z1159 Encounter for screening for other viral diseases: Secondary | ICD-10-CM | POA: Diagnosis not present

## 2018-09-28 DIAGNOSIS — N390 Urinary tract infection, site not specified: Secondary | ICD-10-CM | POA: Diagnosis not present

## 2018-09-28 DIAGNOSIS — Z832 Family history of diseases of the blood and blood-forming organs and certain disorders involving the immune mechanism: Secondary | ICD-10-CM | POA: Diagnosis not present

## 2018-10-04 DIAGNOSIS — I10 Essential (primary) hypertension: Secondary | ICD-10-CM | POA: Diagnosis not present

## 2018-10-04 DIAGNOSIS — R9431 Abnormal electrocardiogram [ECG] [EKG]: Secondary | ICD-10-CM | POA: Diagnosis not present

## 2018-10-04 DIAGNOSIS — R0602 Shortness of breath: Secondary | ICD-10-CM | POA: Diagnosis not present

## 2018-10-04 DIAGNOSIS — I119 Hypertensive heart disease without heart failure: Secondary | ICD-10-CM | POA: Diagnosis not present

## 2018-10-04 DIAGNOSIS — R739 Hyperglycemia, unspecified: Secondary | ICD-10-CM | POA: Diagnosis not present

## 2018-10-05 DIAGNOSIS — L989 Disorder of the skin and subcutaneous tissue, unspecified: Secondary | ICD-10-CM | POA: Diagnosis not present

## 2018-10-05 DIAGNOSIS — R300 Dysuria: Secondary | ICD-10-CM | POA: Diagnosis not present

## 2018-10-05 DIAGNOSIS — R7303 Prediabetes: Secondary | ICD-10-CM | POA: Diagnosis not present

## 2018-10-08 DIAGNOSIS — Z79891 Long term (current) use of opiate analgesic: Secondary | ICD-10-CM | POA: Diagnosis not present

## 2018-10-08 DIAGNOSIS — G894 Chronic pain syndrome: Secondary | ICD-10-CM | POA: Diagnosis not present

## 2018-10-08 DIAGNOSIS — M47816 Spondylosis without myelopathy or radiculopathy, lumbar region: Secondary | ICD-10-CM | POA: Diagnosis not present

## 2018-10-08 DIAGNOSIS — M150 Primary generalized (osteo)arthritis: Secondary | ICD-10-CM | POA: Diagnosis not present

## 2018-11-09 DIAGNOSIS — G894 Chronic pain syndrome: Secondary | ICD-10-CM | POA: Diagnosis not present

## 2018-11-09 DIAGNOSIS — M150 Primary generalized (osteo)arthritis: Secondary | ICD-10-CM | POA: Diagnosis not present

## 2018-11-09 DIAGNOSIS — Z79891 Long term (current) use of opiate analgesic: Secondary | ICD-10-CM | POA: Diagnosis not present

## 2018-11-09 DIAGNOSIS — M47816 Spondylosis without myelopathy or radiculopathy, lumbar region: Secondary | ICD-10-CM | POA: Diagnosis not present

## 2018-11-16 DIAGNOSIS — R5383 Other fatigue: Secondary | ICD-10-CM | POA: Diagnosis not present

## 2018-11-16 DIAGNOSIS — I10 Essential (primary) hypertension: Secondary | ICD-10-CM | POA: Diagnosis not present

## 2018-11-16 DIAGNOSIS — R0600 Dyspnea, unspecified: Secondary | ICD-10-CM | POA: Diagnosis not present

## 2018-11-16 DIAGNOSIS — R9431 Abnormal electrocardiogram [ECG] [EKG]: Secondary | ICD-10-CM | POA: Diagnosis not present

## 2018-11-19 DIAGNOSIS — Z6838 Body mass index (BMI) 38.0-38.9, adult: Secondary | ICD-10-CM | POA: Diagnosis not present

## 2018-11-19 DIAGNOSIS — R635 Abnormal weight gain: Secondary | ICD-10-CM | POA: Diagnosis not present

## 2018-11-19 DIAGNOSIS — E663 Overweight: Secondary | ICD-10-CM | POA: Diagnosis not present

## 2018-11-22 DIAGNOSIS — R0683 Snoring: Secondary | ICD-10-CM | POA: Diagnosis not present

## 2018-11-22 DIAGNOSIS — G4719 Other hypersomnia: Secondary | ICD-10-CM | POA: Diagnosis not present

## 2018-11-22 DIAGNOSIS — G4733 Obstructive sleep apnea (adult) (pediatric): Secondary | ICD-10-CM | POA: Diagnosis not present

## 2018-11-22 DIAGNOSIS — R351 Nocturia: Secondary | ICD-10-CM | POA: Diagnosis not present

## 2018-11-25 DIAGNOSIS — E039 Hypothyroidism, unspecified: Secondary | ICD-10-CM | POA: Diagnosis not present

## 2018-11-30 ENCOUNTER — Telehealth: Payer: Self-pay

## 2018-11-30 NOTE — Telephone Encounter (Signed)
NOTES ON FILE FROM DR Antony Haste ROSS [phone], REFERRAL SENT TO SCHEDULING

## 2018-12-06 DIAGNOSIS — G894 Chronic pain syndrome: Secondary | ICD-10-CM | POA: Diagnosis not present

## 2018-12-06 DIAGNOSIS — M150 Primary generalized (osteo)arthritis: Secondary | ICD-10-CM | POA: Diagnosis not present

## 2018-12-06 DIAGNOSIS — M47816 Spondylosis without myelopathy or radiculopathy, lumbar region: Secondary | ICD-10-CM | POA: Diagnosis not present

## 2018-12-06 DIAGNOSIS — Z79891 Long term (current) use of opiate analgesic: Secondary | ICD-10-CM | POA: Diagnosis not present

## 2018-12-16 ENCOUNTER — Ambulatory Visit: Payer: Medicare Other | Admitting: Cardiovascular Disease

## 2018-12-17 DIAGNOSIS — G4730 Sleep apnea, unspecified: Secondary | ICD-10-CM | POA: Diagnosis not present

## 2018-12-23 DIAGNOSIS — L820 Inflamed seborrheic keratosis: Secondary | ICD-10-CM | POA: Diagnosis not present

## 2018-12-23 DIAGNOSIS — L821 Other seborrheic keratosis: Secondary | ICD-10-CM | POA: Diagnosis not present

## 2019-01-05 DIAGNOSIS — M47816 Spondylosis without myelopathy or radiculopathy, lumbar region: Secondary | ICD-10-CM | POA: Diagnosis not present

## 2019-01-05 DIAGNOSIS — M150 Primary generalized (osteo)arthritis: Secondary | ICD-10-CM | POA: Diagnosis not present

## 2019-01-05 DIAGNOSIS — Z79891 Long term (current) use of opiate analgesic: Secondary | ICD-10-CM | POA: Diagnosis not present

## 2019-01-05 DIAGNOSIS — G894 Chronic pain syndrome: Secondary | ICD-10-CM | POA: Diagnosis not present

## 2019-01-17 DIAGNOSIS — R5383 Other fatigue: Secondary | ICD-10-CM | POA: Diagnosis not present

## 2019-01-17 DIAGNOSIS — R635 Abnormal weight gain: Secondary | ICD-10-CM | POA: Diagnosis not present

## 2019-01-17 DIAGNOSIS — S40012A Contusion of left shoulder, initial encounter: Secondary | ICD-10-CM | POA: Diagnosis not present

## 2019-03-28 DIAGNOSIS — D2371 Other benign neoplasm of skin of right lower limb, including hip: Secondary | ICD-10-CM | POA: Diagnosis not present

## 2019-03-28 DIAGNOSIS — L821 Other seborrheic keratosis: Secondary | ICD-10-CM | POA: Diagnosis not present

## 2019-03-28 DIAGNOSIS — D485 Neoplasm of uncertain behavior of skin: Secondary | ICD-10-CM | POA: Diagnosis not present

## 2019-03-28 DIAGNOSIS — L570 Actinic keratosis: Secondary | ICD-10-CM | POA: Diagnosis not present

## 2019-04-01 DIAGNOSIS — R0989 Other specified symptoms and signs involving the circulatory and respiratory systems: Secondary | ICD-10-CM | POA: Diagnosis not present

## 2019-04-01 DIAGNOSIS — I10 Essential (primary) hypertension: Secondary | ICD-10-CM | POA: Diagnosis not present

## 2019-04-05 DIAGNOSIS — E663 Overweight: Secondary | ICD-10-CM | POA: Diagnosis not present

## 2019-04-09 DIAGNOSIS — H6690 Otitis media, unspecified, unspecified ear: Secondary | ICD-10-CM | POA: Diagnosis not present

## 2019-04-09 DIAGNOSIS — H9201 Otalgia, right ear: Secondary | ICD-10-CM | POA: Diagnosis not present

## 2019-04-11 DIAGNOSIS — I10 Essential (primary) hypertension: Secondary | ICD-10-CM | POA: Diagnosis not present

## 2019-04-28 ENCOUNTER — Ambulatory Visit: Payer: Medicare Other

## 2019-04-28 ENCOUNTER — Ambulatory Visit: Payer: Federal, State, Local not specified - PPO | Attending: Internal Medicine

## 2019-04-28 DIAGNOSIS — Z23 Encounter for immunization: Secondary | ICD-10-CM

## 2019-04-28 NOTE — Progress Notes (Signed)
2  Covid-19 Vaccination Clinic  Name:  Sally [name]    MRN: [id number] DOB: 05-24-1945  04/28/2019  [name] was observed post Covid-19 immunization for 15 minutes without incidence. She was provided with Vaccine Information Sheet and instruction to access the V-Safe system.   [name] was instructed to call 911 with any severe reactions post vaccine: Marland Kitchen Difficulty breathing  . Swelling of your face and throat  . A fast heartbeat  . A bad rash all over your body  . Dizziness and weakness    Immunizations Administered    Name Date Dose VIS Date Route   Pfizer COVID-19 Vaccine 04/28/2019  4:05 PM 0.3 mL 02/11/2019 Intramuscular   Manufacturer: [place]   Lot: [id number]   [place]: [id number]

## 2019-05-02 DIAGNOSIS — E559 Vitamin D deficiency, unspecified: Secondary | ICD-10-CM | POA: Diagnosis not present

## 2019-05-02 DIAGNOSIS — R8290 Unspecified abnormal findings in urine: Secondary | ICD-10-CM | POA: Diagnosis not present

## 2019-05-02 DIAGNOSIS — R7989 Other specified abnormal findings of blood chemistry: Secondary | ICD-10-CM | POA: Diagnosis not present

## 2019-05-02 DIAGNOSIS — R7303 Prediabetes: Secondary | ICD-10-CM | POA: Diagnosis not present

## 2019-05-02 DIAGNOSIS — R351 Nocturia: Secondary | ICD-10-CM | POA: Diagnosis not present

## 2019-05-02 DIAGNOSIS — E039 Hypothyroidism, unspecified: Secondary | ICD-10-CM | POA: Diagnosis not present

## 2019-05-10 DIAGNOSIS — E782 Mixed hyperlipidemia: Secondary | ICD-10-CM | POA: Diagnosis not present

## 2019-05-10 DIAGNOSIS — E663 Overweight: Secondary | ICD-10-CM | POA: Diagnosis not present

## 2019-05-23 DIAGNOSIS — M25461 Effusion, right knee: Secondary | ICD-10-CM | POA: Diagnosis not present

## 2019-05-23 DIAGNOSIS — M1711 Unilateral primary osteoarthritis, right knee: Secondary | ICD-10-CM | POA: Diagnosis not present

## 2019-05-24 ENCOUNTER — Ambulatory Visit: Payer: Federal, State, Local not specified - PPO | Attending: Internal Medicine

## 2019-05-24 DIAGNOSIS — Z23 Encounter for immunization: Secondary | ICD-10-CM

## 2019-05-24 NOTE — Progress Notes (Signed)
   Covid-19 Vaccination Clinic  Name:  JAMARAH BALTES    MRN: [id number] DOB: 22-Oct-1945  05/24/2019  Ms. Drummonds was observed post Covid-19 immunization for 15 minutes without incident. She was provided with Vaccine Information Sheet and instruction to access the V-Safe system.   Ms. Sellner was instructed to call 911 with any severe reactions post vaccine: Marland Kitchen Difficulty breathing  . Swelling of face and throat  . A fast heartbeat  . A bad rash all over body  . Dizziness and weakness   Immunizations Administered    Name Date Dose VIS Date Route   Pfizer COVID-19 Vaccine 05/24/2019  1:31 PM 0.3 mL 02/11/2019 Intramuscular   Manufacturer: [place]   Lot: [id number]   [place]: [id number]

## 2019-05-31 DIAGNOSIS — N390 Urinary tract infection, site not specified: Secondary | ICD-10-CM | POA: Diagnosis not present

## 2019-06-20 DIAGNOSIS — Z79899 Other long term (current) drug therapy: Secondary | ICD-10-CM | POA: Diagnosis not present

## 2019-06-20 DIAGNOSIS — E663 Overweight: Secondary | ICD-10-CM | POA: Diagnosis not present

## 2019-06-20 DIAGNOSIS — E039 Hypothyroidism, unspecified: Secondary | ICD-10-CM | POA: Diagnosis not present

## 2019-06-24 DIAGNOSIS — M150 Primary generalized (osteo)arthritis: Secondary | ICD-10-CM | POA: Diagnosis not present

## 2019-06-24 DIAGNOSIS — M47816 Spondylosis without myelopathy or radiculopathy, lumbar region: Secondary | ICD-10-CM | POA: Diagnosis not present

## 2019-06-24 DIAGNOSIS — Z79891 Long term (current) use of opiate analgesic: Secondary | ICD-10-CM | POA: Diagnosis not present

## 2019-06-24 DIAGNOSIS — G894 Chronic pain syndrome: Secondary | ICD-10-CM | POA: Diagnosis not present

## 2019-07-25 DIAGNOSIS — Z79891 Long term (current) use of opiate analgesic: Secondary | ICD-10-CM | POA: Diagnosis not present

## 2019-07-25 DIAGNOSIS — M150 Primary generalized (osteo)arthritis: Secondary | ICD-10-CM | POA: Diagnosis not present

## 2019-07-25 DIAGNOSIS — G894 Chronic pain syndrome: Secondary | ICD-10-CM | POA: Diagnosis not present

## 2019-07-25 DIAGNOSIS — M47816 Spondylosis without myelopathy or radiculopathy, lumbar region: Secondary | ICD-10-CM | POA: Diagnosis not present

## 2019-07-28 DIAGNOSIS — E663 Overweight: Secondary | ICD-10-CM | POA: Diagnosis not present

## 2019-07-28 DIAGNOSIS — Z79899 Other long term (current) drug therapy: Secondary | ICD-10-CM | POA: Diagnosis not present

## 2019-09-20 DIAGNOSIS — D225 Melanocytic nevi of trunk: Secondary | ICD-10-CM | POA: Diagnosis not present

## 2019-09-20 DIAGNOSIS — L304 Erythema intertrigo: Secondary | ICD-10-CM | POA: Diagnosis not present

## 2019-09-20 DIAGNOSIS — L570 Actinic keratosis: Secondary | ICD-10-CM | POA: Diagnosis not present

## 2019-09-20 DIAGNOSIS — L821 Other seborrheic keratosis: Secondary | ICD-10-CM | POA: Diagnosis not present

## 2019-09-20 DIAGNOSIS — L905 Scar conditions and fibrosis of skin: Secondary | ICD-10-CM | POA: Diagnosis not present

## 2019-09-20 DIAGNOSIS — L814 Other melanin hyperpigmentation: Secondary | ICD-10-CM | POA: Diagnosis not present

## 2019-09-20 DIAGNOSIS — Z85828 Personal history of other malignant neoplasm of skin: Secondary | ICD-10-CM | POA: Diagnosis not present

## 2019-09-20 DIAGNOSIS — I781 Nevus, non-neoplastic: Secondary | ICD-10-CM | POA: Diagnosis not present

## 2019-09-22 DIAGNOSIS — Z79891 Long term (current) use of opiate analgesic: Secondary | ICD-10-CM | POA: Diagnosis not present

## 2019-09-22 DIAGNOSIS — G894 Chronic pain syndrome: Secondary | ICD-10-CM | POA: Diagnosis not present

## 2019-09-22 DIAGNOSIS — M150 Primary generalized (osteo)arthritis: Secondary | ICD-10-CM | POA: Diagnosis not present

## 2019-09-22 DIAGNOSIS — M47816 Spondylosis without myelopathy or radiculopathy, lumbar region: Secondary | ICD-10-CM | POA: Diagnosis not present

## 2019-10-03 DIAGNOSIS — E663 Overweight: Secondary | ICD-10-CM | POA: Diagnosis not present

## 2019-10-25 DIAGNOSIS — G894 Chronic pain syndrome: Secondary | ICD-10-CM | POA: Diagnosis not present

## 2019-10-25 DIAGNOSIS — M150 Primary generalized (osteo)arthritis: Secondary | ICD-10-CM | POA: Diagnosis not present

## 2019-10-25 DIAGNOSIS — M47816 Spondylosis without myelopathy or radiculopathy, lumbar region: Secondary | ICD-10-CM | POA: Diagnosis not present

## 2019-10-25 DIAGNOSIS — Z79891 Long term (current) use of opiate analgesic: Secondary | ICD-10-CM | POA: Diagnosis not present

## 2019-11-03 DIAGNOSIS — E663 Overweight: Secondary | ICD-10-CM | POA: Diagnosis not present

## 2019-11-03 DIAGNOSIS — Z6837 Body mass index (BMI) 37.0-37.9, adult: Secondary | ICD-10-CM | POA: Diagnosis not present

## 2019-11-09 DIAGNOSIS — N390 Urinary tract infection, site not specified: Secondary | ICD-10-CM | POA: Diagnosis not present

## 2019-11-17 DIAGNOSIS — E559 Vitamin D deficiency, unspecified: Secondary | ICD-10-CM | POA: Diagnosis not present

## 2019-11-17 DIAGNOSIS — R350 Frequency of micturition: Secondary | ICD-10-CM | POA: Diagnosis not present

## 2019-11-17 DIAGNOSIS — E039 Hypothyroidism, unspecified: Secondary | ICD-10-CM | POA: Diagnosis not present

## 2019-11-17 DIAGNOSIS — R7303 Prediabetes: Secondary | ICD-10-CM | POA: Diagnosis not present

## 2019-11-23 DIAGNOSIS — M47816 Spondylosis without myelopathy or radiculopathy, lumbar region: Secondary | ICD-10-CM | POA: Diagnosis not present

## 2019-11-23 DIAGNOSIS — M150 Primary generalized (osteo)arthritis: Secondary | ICD-10-CM | POA: Diagnosis not present

## 2019-11-23 DIAGNOSIS — Z79891 Long term (current) use of opiate analgesic: Secondary | ICD-10-CM | POA: Diagnosis not present

## 2019-11-23 DIAGNOSIS — G894 Chronic pain syndrome: Secondary | ICD-10-CM | POA: Diagnosis not present

## 2019-11-24 DIAGNOSIS — E039 Hypothyroidism, unspecified: Secondary | ICD-10-CM | POA: Diagnosis not present

## 2019-12-05 DIAGNOSIS — E663 Overweight: Secondary | ICD-10-CM | POA: Diagnosis not present

## 2019-12-22 DIAGNOSIS — G894 Chronic pain syndrome: Secondary | ICD-10-CM | POA: Diagnosis not present

## 2019-12-22 DIAGNOSIS — M150 Primary generalized (osteo)arthritis: Secondary | ICD-10-CM | POA: Diagnosis not present

## 2019-12-22 DIAGNOSIS — M47816 Spondylosis without myelopathy or radiculopathy, lumbar region: Secondary | ICD-10-CM | POA: Diagnosis not present

## 2019-12-22 DIAGNOSIS — Z79891 Long term (current) use of opiate analgesic: Secondary | ICD-10-CM | POA: Diagnosis not present

## 2020-01-12 DIAGNOSIS — E663 Overweight: Secondary | ICD-10-CM | POA: Diagnosis not present

## 2020-01-12 DIAGNOSIS — R0602 Shortness of breath: Secondary | ICD-10-CM | POA: Diagnosis not present

## 2020-01-12 DIAGNOSIS — Z6837 Body mass index (BMI) 37.0-37.9, adult: Secondary | ICD-10-CM | POA: Diagnosis not present

## 2020-01-18 DIAGNOSIS — M150 Primary generalized (osteo)arthritis: Secondary | ICD-10-CM | POA: Diagnosis not present

## 2020-01-18 DIAGNOSIS — Z79891 Long term (current) use of opiate analgesic: Secondary | ICD-10-CM | POA: Diagnosis not present

## 2020-01-18 DIAGNOSIS — M47816 Spondylosis without myelopathy or radiculopathy, lumbar region: Secondary | ICD-10-CM | POA: Diagnosis not present

## 2020-01-18 DIAGNOSIS — G894 Chronic pain syndrome: Secondary | ICD-10-CM | POA: Diagnosis not present

## 2020-02-07 DIAGNOSIS — Z6837 Body mass index (BMI) 37.0-37.9, adult: Secondary | ICD-10-CM | POA: Diagnosis not present

## 2020-02-07 DIAGNOSIS — E663 Overweight: Secondary | ICD-10-CM | POA: Diagnosis not present

## 2020-02-16 DIAGNOSIS — G894 Chronic pain syndrome: Secondary | ICD-10-CM | POA: Diagnosis not present

## 2020-02-16 DIAGNOSIS — Z79891 Long term (current) use of opiate analgesic: Secondary | ICD-10-CM | POA: Diagnosis not present

## 2020-02-16 DIAGNOSIS — M47816 Spondylosis without myelopathy or radiculopathy, lumbar region: Secondary | ICD-10-CM | POA: Diagnosis not present

## 2020-02-16 DIAGNOSIS — M150 Primary generalized (osteo)arthritis: Secondary | ICD-10-CM | POA: Diagnosis not present

## 2020-12-19 ENCOUNTER — Encounter: Payer: Self-pay | Admitting: Physical Therapy

## 2020-12-19 ENCOUNTER — Ambulatory Visit (INDEPENDENT_AMBULATORY_CARE_PROVIDER_SITE_OTHER): Payer: Medicare Other | Admitting: Physical Therapy

## 2020-12-19 DIAGNOSIS — R262 Difficulty in walking, not elsewhere classified: Secondary | ICD-10-CM

## 2020-12-19 DIAGNOSIS — M6281 Muscle weakness (generalized): Secondary | ICD-10-CM

## 2020-12-19 DIAGNOSIS — M170 Bilateral primary osteoarthritis of knee: Secondary | ICD-10-CM | POA: Diagnosis present

## 2020-12-19 NOTE — Patient Instructions (Signed)
Access Code: [email or web address] URL: [email or web address] Date: 12/19/2020 Prepared by: Isabelle Course  Exercises Supine Bridge - 1 x daily - 7 x weekly - 2 sets - 10 reps Active Straight Leg Raise with Quad Set - 1 x daily - 7 x weekly - 2 sets - 10 reps Kem[place] Quarter Squat - 1 x daily - 7 x weekly - 1 sets - 10 reps - 5 hold Warrior II - 1 x daily - 7 x weekly - 3 sets - 1 reps - 20-30 seconds hold Side Stepping with Resistance at Ankles - 1 x daily - 7 x weekly - 2 sets - 10 reps

## 2020-12-19 NOTE — Therapy (Signed)
[facility] [place] [place] [place], [place], [address] Phone: [phone]   Fax:  [phone]  Physical Therapy Evaluation  Patient Details  Name: Sally [name] MRN: [id number] Date of Birth: [birth year] Referring Provider (PT): [place], [place]   Encounter Date: 12/19/2020   PT End of Session - 12/19/20 1235     Visit Number 1    Number of Visits 18    Date for PT Re-Evaluation 01/30/21    Authorization Type Medicare    Authorization - Visit Number 1    Authorization - Number of Visits 18    Progress Note Due on Visit 10    PT Start Time 1158    PT Stop Time 1236    PT Time Calculation (min) 38 min    Activity Tolerance Patient tolerated treatment well    Behavior During Therapy [facility] for tasks assessed/performed             Past Medical History:  Diagnosis Date   Allergy    Arthritis    "all over"   Chronic pain syndrome    Chronic UTI    Fibromyalgia    Hx of colonic polyps    Hypertension    Hypothyroidism    Obesity    Pre-diabetes    "barely" (12/22/2013)    Past Surgical History:  Procedure Laterality Date   [place]; [place] ARTHROSCOPY Right [place] Left 11/25/2012   Procedure: TOTAL SHOULDER ARTHROPLASTY;  Surgeon: Nita Sells, MD;  Location: [facility];  Service: Orthopedics;  Laterality: Left;  LEFT TOTAL SHOULDER ARTHROPLASTY   TOTAL SHOULDER ARTHROPLASTY Right 12/22/2013   TOTAL SHOULDER ARTHROPLASTY Right 12/22/2013   Procedure: TOTAL SHOULDER ARTHROPLASTY;  Surgeon: Nita Sells, MD;  Location: [facility];  Service: Orthopedics;  Laterality: Right;  Right shoulder total arthroplasty   TUBAL LIGATION  1985    There were no vitals filed for this visit.    Subjective  Assessment - 12/19/20 1201     Subjective Pt went hiking with daughter in September and has had bilat knee pain since then. She states prior to hiking she had not been doing much walking as she had been focusing on strength training. Pt states MD recommended knee replacement but she wants to try PT first. Pain increases with stairs and with prolonged sitting, pain decreases with ice, pain meds.    Pertinent History bilat shoulder replacement    Limitations Sitting;House hold activities;Standing;Walking    How long can you sit comfortably? 20 minutes    How long can you walk comfortably? < 1 min    Patient Stated Goals decrease pain and improve strength    Currently in Pain? Yes    Pain Score 3     Pain Location Knee    Pain Orientation Right    Pain Descriptors / Indicators Aching    Pain Type Chronic pain    Pain Onset More than a month ago    Pain Frequency Intermittent    Aggravating Factors  stairs, prolonged sitting    Pain Relieving Factors ice, meds                [facility] PT Assessment - 12/19/20 0001  Assessment   Medical Diagnosis bilat knee OA    Referring Provider (PT) mccauley, larry    Onset Date/Surgical Date 11/09/20    Next MD Visit 01/28/21    Prior Therapy for bilat shoulders      Precautions   Precautions None      Restrictions   Weight Bearing Restrictions No      Balance Screen   Has the patient fallen in the past 6 months No      Home Environment   Additional Comments multilevel home      Prior Function   Level of Independence Independent      Observation/Other Assessments   Focus on Therapeutic Outcomes (FOTO)  47      ROM / Strength   AROM / PROM / Strength AROM;Strength      AROM   AROM Assessment Site Knee    Right/Left Knee Right;Left    Right Knee Extension -17    Right Knee Flexion 115    Left Knee Extension -8    Left Knee Flexion 110      Strength   Overall Strength Comments bilat hip flex 4-/5, hip ext 4-/5, hip abd  4/5    Strength Assessment Site Knee    Right/Left Knee Left;Right    Right Knee Flexion 4/5    Right Knee Extension 4+/5    Left Knee Flexion 3+/5    Left Knee Extension 4+/5      Flexibility   Soft Tissue Assessment /Muscle Length yes    Hamstrings decreased bilat    Quadriceps decreased bilat      Palpation   Patella mobility hypomobile bilat    Palpation comment increased mm spasticity and trigger points bilat adductors      Ambulation/Gait   Gait Comments decreased knee extension bilat knees during gait, increased trunk lateral sway, hip circumduction bilat                        Objective measurements completed on examination: See above findings.       [facility] Adult PT Treatment/Exercise - 12/19/20 0001       Exercises   Exercises Knee/Hip      Knee/Hip Exercises: Stretches   Other Knee/Hip Stretches warrior pose for adductor stretch x 30 sec bilat      Knee/Hip Exercises: Standing   Mikels Squat 5 reps;5 seconds    [place] Squat Limitations quarter squat    Walking with Sports Cord side step red TB x 10 each way      Knee/Hip Exercises: Supine   Bridges 10 reps    Straight Leg Raises 10 reps    Straight Leg Raises Limitations with quad set                     PT Education - 12/19/20 1227     Education Details PT POC and goals, HEP    Person(s) Educated Patient    Methods Explanation;Demonstration;Handout    Comprehension Returned demonstration;Verbalized understanding                 PT Long Term Goals - 12/19/20 1245       PT LONG TERM GOAL #1   Title Pt will be independent with HEP    Time 6    Period Weeks    Status New    Target Date 01/30/21      PT LONG TERM GOAL #2   Title Pt will  improve FOTO to >= 64 to demo improved functional mobility    Time 6    Period Weeks    Status New    Target Date 01/30/21      PT LONG TERM GOAL #3   Title Pt will improve bilat LE strength to 4+/5 to improve standing and gait  tolerance    Time 6    Period Weeks    Status New    Target Date 01/30/21      PT LONG TERM GOAL #4   Title Pt will return to LE strengthening program at gym with knee pain <= 2/10    Time 6    Period Weeks    Status New    Target Date 01/30/21                    Plan - 12/19/20 1237     Clinical Impression Statement Pt is a 75 y/o female referred for bilat knee OA. Pt presents with decreased strength and ROM, impaired gait and activity tolerance and increased pain. Pt will benefit from skilled PT to address deficits and improve functional mobility    Personal Factors and Comorbidities Time since onset of injury/illness/exacerbation;Comorbidity 1    Examination-Activity Limitations Stairs;Squat;Stand;Transfers;Sit    Examination-Participation Restrictions Community Activity;Cleaning;Yard Work    Stability/Clinical Decision Making Stable/Uncomplicated    Designer, jewellery Low    Rehab Potential Good    PT Frequency 3x / week    PT Duration 6 weeks    PT Treatment/Interventions Aquatic Therapy;Cryotherapy;Moist Heat;Electrical Stimulation;Gait training;Stair training;Therapeutic exercise;Therapeutic activities;Balance training;Neuromuscular re-education;Patient/family education;Manual techniques;Passive range of motion;Dry needling;Vasopneumatic Device;Taping    PT Next Visit Plan assess HEP, progress knee strength and stability    PT Home Exercise Plan [email or web address]    Consulted and Agree with Plan of Care Patient             Patient will benefit from skilled therapeutic intervention in order to improve the following deficits and impairments:  Pain, Impaired flexibility, Decreased strength, Decreased activity tolerance, Decreased range of motion, Increased edema, Difficulty walking, Abnormal gait, Decreased balance, Decreased mobility  Visit Diagnosis: Osteoarthritis of both knees, unspecified osteoarthritis type - Plan: PT plan of care cert/re-cert  Muscle  weakness (generalized) - Plan: PT plan of care cert/re-cert  Difficulty in walking, not elsewhere classified - Plan: PT plan of care cert/re-cert     Problem List Patient Active Problem List   Diagnosis Date Noted   Arthritis of shoulder region, right, degenerative 12/22/2013   Osteoarthritis of right shoulder 08/09/2013   Right tennis elbow 08/09/2013   [place]us post left total shoulder arthroplasty 11/26/2012   Fibromyalgia 08/15/2010   Chronic pain 08/15/2010   HYPOTHYROIDISM 05/15/2009   HYPERTENSION 05/15/2009   ALLERGIC RHINITIS 05/15/2009   DYSURIA 05/15/2009   COLONIC POLYPS, HX OF 05/15/2009    Samiyyah Moffa, PT 12/19/2020, 1:22 PM  [facility] [place] [address], [place], [address] Phone: [phone]   Fax:  [phone]  Name: SADIYA DURAND MRN: [id number] Date of Birth: 08-28-1945

## 2020-12-26 ENCOUNTER — Other Ambulatory Visit: Payer: Self-pay

## 2020-12-26 ENCOUNTER — Ambulatory Visit (INDEPENDENT_AMBULATORY_CARE_PROVIDER_SITE_OTHER): Payer: Medicare Other | Admitting: Physical Therapy

## 2020-12-26 ENCOUNTER — Encounter: Payer: Self-pay | Admitting: Physical Therapy

## 2020-12-26 DIAGNOSIS — R262 Difficulty in walking, not elsewhere classified: Secondary | ICD-10-CM

## 2020-12-26 DIAGNOSIS — M6281 Muscle weakness (generalized): Secondary | ICD-10-CM | POA: Diagnosis not present

## 2020-12-26 DIAGNOSIS — M170 Bilateral primary osteoarthritis of knee: Secondary | ICD-10-CM

## 2020-12-26 NOTE — Therapy (Signed)
[place], [place], [address] Phone: [phone]   Fax:  [phone]  Physical Therapy Treatment  Patient Details  Name: Sally [name] MRN: [id number] Date of Birth: [birth year] Referring Provider (PT): [place], [place]   Encounter Date: 12/26/2020   PT End of Session - 12/26/20 1451     Visit Number 2    Number of Visits 18    Date for PT Re-Evaluation 01/30/21    Authorization Type Medicare    Authorization - Visit Number 2    Authorization - Number of Visits 18    Progress Note Due on Visit 10    PT Start Time 6948    PT Stop Time 1516    PT Time Calculation (min) 38 min    Activity Tolerance Patient tolerated treatment well    Behavior During Therapy [facility] for tasks assessed/performed             Past Medical History:  Diagnosis Date   Allergy    Arthritis    "all over"   Chronic pain syndrome    Chronic UTI    Fibromyalgia    Hx of colonic polyps    Hypertension    Hypothyroidism    Obesity    Pre-diabetes    "barely" (12/22/2013)    Past Surgical History:  Procedure Laterality Date   [place]; [place] ARTHROSCOPY Right [place] Left 11/25/2012   Procedure: TOTAL SHOULDER ARTHROPLASTY;  Surgeon: Nita Sells, MD;  Location: [place];  Service: Orthopedics;  Laterality: Left;  LEFT TOTAL SHOULDER ARTHROPLASTY   TOTAL SHOULDER ARTHROPLASTY Right 12/22/2013   TOTAL SHOULDER ARTHROPLASTY Right 12/22/2013   Procedure: TOTAL SHOULDER ARTHROPLASTY;  Surgeon: Nita Sells, MD;  Location: [place];  Service: Orthopedics;  Laterality: Right;  Right shoulder total arthroplasty   TUBAL LIGATION  1985    There were no vitals filed for this visit.   Subjective  Assessment - 12/26/20 1519     Subjective Pt reports she feels like she is walking a little better than a week ago.  She is unable to get on floor for exercise of HEP (downdog to warrior I and II).  She rode her recumbent bike for 15 min prior to arrival.    Pertinent History bilat shoulder replacement    Limitations Sitting;House hold activities;Standing;Walking    How long can you sit comfortably? 20 minutes    How long can you walk comfortably? < 1 min    Patient Stated Goals decrease pain and improve strength    Currently in Pain? Yes    Pain Score 5     Pain Location Knee    Pain Orientation Right    Pain Descriptors / Indicators Aching    Pain Onset More than a month ago    Aggravating Factors  stairs    Pain Relieving Factors recumbent bicycle, ice, meds.                [facility] PT Assessment - 12/26/20 0001       Assessment   Medical Diagnosis bilat knee OA    Referring Provider (PT) mccauley, larry    Onset Date/Surgical Date 11/09/20    Next MD  Visit 01/28/21    Prior Therapy for bilat shoulders               [facility] Adult PT Treatment/Exercise - 12/26/20 0001       Knee/Hip Exercises: Stretches   Passive Hamstring Stretch Right;Left;3 reps;20 seconds   seated   Quad Stretch Right;Left;2 reps;20 seconds   sitting foot under chair.   Quad Stretch Limitations (prefers seated hip flexor stretch)    Hip Flexor Stretch Right;Left;2 reps;20 seconds   seated with leg off side of chair.   Gastroc Stretch --    Other Knee/Hip Stretches standing adductor stretch holding onto counter x 20 sec      Knee/Hip Exercises: Standing   [place]be Squat 1 set;10 reps;5 seconds    [place] Squat Limitations quarter squat    Walking with Sports Cord side step yellow TB x 10 each way, with UE support on elevated table.      Knee/Hip Exercises: Supine   Bridges 10 reps    Straight Leg Raises Strengthening;Left;Right;1 set;10 reps    Straight Leg Raises Limitations with quad set                           PT Long Term Goals - 12/19/20 1245       PT LONG TERM GOAL #1   Title Pt will be independent with HEP    Time 6    Period Weeks    Status New    Target Date 01/30/21      PT LONG TERM GOAL #2   Title Pt will improve FOTO to >= 64 to demo improved functional mobility    Time 6    Period Weeks    Status New    Target Date 01/30/21      PT LONG TERM GOAL #3   Title Pt will improve bilat LE strength to 4+/5 to improve standing and gait tolerance    Time 6    Period Weeks    Status New    Target Date 01/30/21      PT LONG TERM GOAL #4   Title Pt will return to LE strengthening program at gym with knee pain <= 2/10    Time 6    Period Weeks    Status New    Target Date 01/30/21                   Plan - 12/26/20 1521     Clinical Impression Statement Pt responded well to stretches for LE.  Minor cues for form.  Modified HEP to include LE stretches.  Goals are ongoing.    Personal Factors and Comorbidities Time since onset of injury/illness/exacerbation;Comorbidity 1    Examination-Activity Limitations Stairs;Squat;Stand;Transfers;Sit    Examination-Participation Restrictions Art gallery manager;Yard Work    Stability/Clinical Decision Making Stable/Uncomplicated    Rehab Potential Good    PT Frequency 3x / week    PT Duration 6 weeks    PT Treatment/Interventions Aquatic Therapy;Cryotherapy;Moist Heat;Electrical Stimulation;Gait Scientist, forensic;Therapeutic exercise;Therapeutic activities;Balance training;Neuromuscular re-education;Patient/family education;Manual techniques;Passive range of motion;Dry needling;Vasopneumatic Device;Taping    PT Next Visit Plan assess HEP, progress knee strength and stability    PT Home Exercise Plan [email or web address]    Consulted and Agree with Plan of Care Patient             Patient will benefit from skilled therapeutic intervention in order to improve the following deficits and  impairments:  Pain, Impaired flexibility,  Decreased strength, Decreased activity tolerance, Decreased range of motion, Increased edema, Difficulty walking, Abnormal gait, Decreased balance, Decreased mobility  Visit Diagnosis: Osteoarthritis of both knees, unspecified osteoarthritis type  Muscle weakness (generalized)  Difficulty in walking, not elsewhere classified     Problem List Patient Active Problem List   Diagnosis Date Noted   Arthritis of shoulder region, right, degenerative 12/22/2013   Osteoarthritis of right shoulder 08/09/2013   Right tennis elbow 08/09/2013   Stautus post left total shoulder arthroplasty 11/26/2012   Fibromyalgia 08/15/2010   Chronic pain 08/15/2010   HYPOTHYROIDISM 05/15/2009   HYPERTENSION 05/15/2009   ALLERGIC RHINITIS 05/15/2009   DYSURIA 05/15/2009   COLONIC POLYPS, HX OF 05/15/2009   Kerin Perna, PTA 12/26/20 3:29 PM   [facility] [place], [place], [address] Phone: [phone]   Fax:  [phone]  Name: JUSTIN BUECHNER MRN: [id number] Date of Birth: Aug 02, 1945

## 2020-12-26 NOTE — Patient Instructions (Addendum)
  Access Code: [email or web address] URL: [email or web address] Date: 12/26/2020 Prepared by: [facility]  Exercises Supine Bridge - 1 x daily - 3-4 x weekly - 2 sets - 10 reps Active Straight Leg Raise with Quad Set - 1 x daily - 3-4 x weekly - 2 sets - 10 reps [place] Quarter Squat - 1 x daily - 3-4 x weekly - 1 sets - 10 reps - 5 hold Side Stepping with Resistance at Ankles - 1 x daily - 3-4 x weekly - 2 sets - 10 reps Lateral Lunge Adductor Stretch with Counter Support - 2 x daily - 7 x weekly - 2-3 reps - 15-30 seconds hold Seated Hamstring Stretch - 2 x daily - 7 x weekly - 2-3 reps - 15-30 sec hold

## 2020-12-28 ENCOUNTER — Ambulatory Visit (INDEPENDENT_AMBULATORY_CARE_PROVIDER_SITE_OTHER): Payer: Medicare Other | Admitting: Physical Therapy

## 2020-12-28 ENCOUNTER — Other Ambulatory Visit: Payer: Self-pay

## 2020-12-28 ENCOUNTER — Encounter: Payer: Self-pay | Admitting: Physical Therapy

## 2020-12-28 DIAGNOSIS — R262 Difficulty in walking, not elsewhere classified: Secondary | ICD-10-CM

## 2020-12-28 DIAGNOSIS — M170 Bilateral primary osteoarthritis of knee: Secondary | ICD-10-CM

## 2020-12-28 DIAGNOSIS — M6281 Muscle weakness (generalized): Secondary | ICD-10-CM

## 2020-12-28 NOTE — Therapy (Signed)
[place], [place], [address] Phone: [phone]   Fax:  [phone]  Physical Therapy Treatment  Patient Details  Name: Sally [name] MRN: [id number] Date of Birth: 01-25-1946 Referring Provider (PT): [place], [place]   Encounter Date: 12/28/2020   PT End of Session - 12/28/20 1134     Visit Number 3    Number of Visits 18    Date for PT Re-Evaluation 01/30/21    Authorization Type Medicare    Authorization - Visit Number 3    Authorization - Number of Visits 18    Progress Note Due on Visit 10    PT Start Time 1110   pt arrived late   PT Stop Time 1152    PT Time Calculation (min) 42 min    Activity Tolerance Patient tolerated treatment well    Behavior During Therapy [facility] for tasks assessed/performed             Past Medical History:  Diagnosis Date   Allergy    Arthritis    "all over"   Chronic pain syndrome    Chronic UTI    Fibromyalgia    Hx of colonic polyps    Hypertension    Hypothyroidism    Obesity    Pre-diabetes    "barely" (12/22/2013)    Past Surgical History:  Procedure Laterality Date   [place]; [place] ARTHROSCOPY Right [place] ARTHROPLASTY Left 11/25/2012   Procedure: TOTAL SHOULDER ARTHROPLASTY;  Surgeon: Nita Sells, MD;  Location: [place];  Service: Orthopedics;  Laterality: Left;  LEFT TOTAL SHOULDER ARTHROPLASTY   TOTAL SHOULDER ARTHROPLASTY Right 12/22/2013   TOTAL SHOULDER ARTHROPLASTY Right 12/22/2013   Procedure: TOTAL SHOULDER ARTHROPLASTY;  Surgeon: Nita Sells, MD;  Location: [place];  Service: Orthopedics;  Laterality: Right;  Right shoulder total arthroplasty   TUBAL LIGATION  1985    There were no vitals filed for this  visit.   Subjective Assessment - 12/28/20 1114     Subjective Pt reports she is sore, and was very sore yesterday. She tried some LE exercises yesterday.    Pertinent History bilat shoulder replacement    Patient Stated Goals decrease pain and improve strength    Currently in Pain? Yes    Pain Score 7     Pain Location Generalized                [facility] PT Assessment - 12/28/20 0001       Assessment   Medical Diagnosis bilat knee OA    Referring Provider (PT) mccauley, larry    Onset Date/Surgical Date 11/09/20    Next MD Visit 01/28/21    Prior Therapy for bilat shoulders               [facility] Adult PT Treatment/Exercise - 12/28/20 0001       Knee/Hip Exercises: Stretches   Passive Hamstring Stretch Right;Left;3 reps;20 seconds   seated   Hip Flexor Stretch Right;Left;2 reps;20 seconds   seated with leg off side of chair for QUAD stretch   Other Knee/Hip Stretches standing adductor stretch holding onto counter x 20 sec      Knee/Hip Exercises: Aerobic   Stationary  Bike L1: 4 min at beginning of session, 5 min at end of session.   Single laps around gym in between exercises to decrease stiffness.      Knee/Hip Exercises: Standing   Forward Step Up Right;Left;1 set;5 sets;Hand Hold: 2;Step Height: 4";Step Height: 6"    Step Down Left;1 set;10 reps   and retro step up with RLE for TKE   Walking with Sports Cord side step yellow TB x 18 each way, with UE support from therapist   Backwards walking holding onto elevated table x 10 ft x 4 reps for focus on TKE.      Knee/Hip Exercises: Seated   Sit to Sand 2 sets;5 reps;without UE support   slow eccentric lowering               PT Long Term Goals - 12/19/20 1245       PT LONG TERM GOAL #1   Title Pt will be independent with HEP    Time 6    Period Weeks    Status New    Target Date 01/30/21      PT LONG TERM GOAL #2   Title Pt will improve FOTO to >= 64 to demo improved functional mobility    Time 6     Period Weeks    Status New    Target Date 01/30/21      PT LONG TERM GOAL #3   Title Pt will improve bilat LE strength to 4+/5 to improve standing and gait tolerance    Time 6    Period Weeks    Status New    Target Date 01/30/21      PT LONG TERM GOAL #4   Title Pt will return to LE strengthening program at gym with knee pain <= 2/10    Time 6    Period Weeks    Status New    Target Date 01/30/21                   Plan - 12/28/20 1137     Clinical Impression Statement Pt's pain reduced to 2/10 after completion of exercise.  She required minor cues for form on exercises.   Progressing well towards goals.    Personal Factors and Comorbidities Time since onset of injury/illness/exacerbation;Comorbidity 1    Examination-Activity Limitations Stairs;Squat;Stand;Transfers;Sit    Examination-Participation Restrictions Art gallery manager;Yard Work    Stability/Clinical Decision Making Stable/Uncomplicated    Rehab Potential Good    PT Frequency 3x / week    PT Duration 6 weeks    PT Treatment/Interventions Aquatic Therapy;Cryotherapy;Moist Heat;Electrical Stimulation;Gait Scientist, forensic;Therapeutic exercise;Therapeutic activities;Balance training;Neuromuscular re-education;Patient/family education;Manual techniques;Passive range of motion;Dry needling;Vasopneumatic Device;Taping    PT Next Visit Plan progress knee strength and stability    PT Home Exercise Plan [email or web address]    Consulted and Agree with Plan of Care Patient             Patient will benefit from skilled therapeutic intervention in order to improve the following deficits and impairments:  Pain, Impaired flexibility, Decreased strength, Decreased activity tolerance, Decreased range of motion, Increased edema, Difficulty walking, Abnormal gait, Decreased balance, Decreased mobility  Visit Diagnosis: Osteoarthritis of both knees, unspecified osteoarthritis type  Muscle weakness  (generalized)  Difficulty in walking, not elsewhere classified     Problem List Patient Active Problem List   Diagnosis Date Noted   Arthritis of shoulder region, right, degenerative 12/22/2013   Osteoarthritis of right shoulder 08/09/2013   Right tennis  elbow 08/09/2013   Stautus post left total shoulder arthroplasty 11/26/2012   Fibromyalgia 08/15/2010   Chronic pain 08/15/2010   HYPOTHYROIDISM 05/15/2009   HYPERTENSION 05/15/2009   ALLERGIC RHINITIS 05/15/2009   DYSURIA 05/15/2009   COLONIC POLYPS, HX OF 05/15/2009   Kerin Perna, PTA 12/28/20 12:01 PM  [facility] [facility] [place], [place], [address] Phone: [phone]   Fax:  [phone]  Name: Sally [name] MRN: [id number] Date of Birth: 1945-06-05

## 2021-01-01 ENCOUNTER — Encounter: Payer: Medicare Other | Admitting: Physical Therapy

## 2021-01-02 ENCOUNTER — Encounter: Payer: Self-pay | Admitting: Physical Therapy

## 2021-01-02 ENCOUNTER — Ambulatory Visit (INDEPENDENT_AMBULATORY_CARE_PROVIDER_SITE_OTHER): Payer: Medicare Other | Admitting: Physical Therapy

## 2021-01-02 ENCOUNTER — Other Ambulatory Visit: Payer: Self-pay

## 2021-01-02 DIAGNOSIS — M170 Bilateral primary osteoarthritis of knee: Secondary | ICD-10-CM | POA: Diagnosis present

## 2021-01-02 DIAGNOSIS — M6281 Muscle weakness (generalized): Secondary | ICD-10-CM | POA: Diagnosis not present

## 2021-01-02 DIAGNOSIS — R262 Difficulty in walking, not elsewhere classified: Secondary | ICD-10-CM

## 2021-01-02 NOTE — Therapy (Signed)
[place], [place], [address] Phone: [phone]   Fax:  [phone]  Physical Therapy Treatment  Patient Details  Name: Sally [name] MRN: [id number] Date of Birth: 11/15/1945 Referring Provider (PT): [place], [place]   Encounter Date: 01/02/2021   PT End of Session - 01/02/21 1534     Visit Number 4    Number of Visits 18    Date for PT Re-Evaluation 01/30/21    Authorization Type Medicare    Authorization - Visit Number 4    Authorization - Number of Visits 18    Progress Note Due on Visit 10    PT Start Time 1520    PT Stop Time 3500    PT Time Calculation (min) 35 min    Activity Tolerance Patient tolerated treatment well    Behavior During Therapy [facility] for tasks assessed/performed             Past Medical History:  Diagnosis Date   Allergy    Arthritis    "all over"   Chronic pain syndrome    Chronic UTI    Fibromyalgia    Hx of colonic polyps    Hypertension    Hypothyroidism    Obesity    Pre-diabetes    "barely" (12/22/2013)    Past Surgical History:  Procedure Laterality Date   [place]; [place] ARTHROSCOPY Right [place] Left 11/25/2012   Procedure: TOTAL SHOULDER ARTHROPLASTY;  Surgeon: Nita Sells, MD;  Location: [place];  Service: Orthopedics;  Laterality: Left;  LEFT TOTAL SHOULDER ARTHROPLASTY   TOTAL SHOULDER ARTHROPLASTY Right 12/22/2013   TOTAL SHOULDER ARTHROPLASTY Right 12/22/2013   Procedure: TOTAL SHOULDER ARTHROPLASTY;  Surgeon: Nita Sells, MD;  Location: [place];  Service: Orthopedics;  Laterality: Right;  Right shoulder total arthroplasty   TUBAL LIGATION  1985    There were no vitals filed for this visit.   Subjective  Assessment - 01/02/21 1532     Subjective Pt reports no new changes since last visit.    Limitations Sitting;House hold activities;Standing;Walking    Currently in Pain? No/denies    Pain Score 0-No pain                [facility] PT Assessment - 01/02/21 0001       Assessment   Medical Diagnosis bilat knee OA    Referring Provider (PT) mccauley, larry    Onset Date/Surgical Date 11/09/20    Next MD Visit 01/28/21    Prior Therapy for bilat shoulders      AROM   Right Knee Extension -17    Right Knee Flexion 113    Left Knee Extension -11    Left Knee Flexion 113      Strength   Right Knee Flexion 4+/5    Right Knee Extension 5/5    Left Knee Flexion 4+/5    Left Knee Extension 5/5               [facility] Adult PT Treatment/Exercise - 01/02/21 0001       Self-Care   Self-Care Other Self-Care Comments    Other Self-Care Comments  pt instructed in self massage to LE with roller  stick.  Pt returned demo with cues.      Knee/Hip Exercises: Stretches   Passive Hamstring Stretch Right;Left;3 reps;20 seconds   seated   Gastroc Stretch Both;2 reps;20 seconds    Other Knee/Hip Stretches standing adductor stretch holding onto counter x 20 sec      Knee/Hip Exercises: Aerobic   Stationary Bike L1: 5 min for warm up at beginning of session.    Other Aerobic single laps around gym in between exercises to assess response to exercise / reduce stiffness.      Knee/Hip Exercises: Standing   Lateral Step Up Right;1 set;10 reps;Hand Hold: 2;Step Height: 6"    Step Down Right;Left;1 set;10 reps;Step Height: 4";Hand Hold: 2   and retro step up with focus on TKE each leg.     Knee/Hip Exercises: Prone   Hamstring Curl 2 sets;5 reps    Prone Knee Hang 2 minutes   2 reps                         PT Long Term Goals - 01/02/21 1658       PT LONG TERM GOAL #1   Title Pt will be independent with HEP    Time 6    Period Weeks    Status On-going      PT LONG TERM  GOAL #2   Title Pt will improve FOTO to >= 64 to demo improved functional mobility    Time 6    Period Weeks    Status On-going      PT LONG TERM GOAL #3   Title Pt will improve bilat LE strength to 4+/5 to improve standing and gait tolerance    Time 6    Period Weeks    Status Partially Met      PT LONG TERM GOAL #4   Title Pt will return to LE strengthening program at gym with knee pain <= 2/10    Time 6    Period Weeks    Status On-going                   Plan - 01/02/21 1554     Clinical Impression Statement No change in bilat knee ROM, but improvement in knee strength.  She reported reduction of tightness in LE at end of session.  Pt reporting overall decrease in pain with activities since starting therapy.  Will trial aquatic therapy in future session.    Personal Factors and Comorbidities Time since onset of injury/illness/exacerbation;Comorbidity 1    Examination-Activity Limitations Stairs;Squat;Stand;Transfers;Sit    Examination-Participation Restrictions Art gallery manager;Yard Work    Stability/Clinical Decision Making Stable/Uncomplicated    Rehab Potential Good    PT Frequency 3x / week    PT Duration 6 weeks    PT Treatment/Interventions Aquatic Therapy;Cryotherapy;Moist Heat;Electrical Stimulation;Gait Scientist, forensic;Therapeutic exercise;Therapeutic activities;Balance training;Neuromuscular re-education;Patient/family education;Manual techniques;Passive range of motion;Dry needling;Vasopneumatic Device;Taping    PT Next Visit Plan progress knee strength and stability.    PT Home Exercise Plan [email or web address]    Consulted and Agree with Plan of Care Patient             Patient will benefit from skilled therapeutic intervention in order to improve the following deficits and impairments:  Pain, Impaired flexibility, Decreased strength, Decreased activity tolerance, Decreased range of motion, Increased edema, Difficulty walking, Abnormal gait,  Decreased balance, Decreased mobility  Visit Diagnosis: Osteoarthritis of both knees, unspecified osteoarthritis type  Muscle weakness (generalized)  Difficulty  in walking, not elsewhere classified     Problem List Patient Active Problem List   Diagnosis Date Noted   Arthritis of shoulder region, right, degenerative 12/22/2013   Osteoarthritis of right shoulder 08/09/2013   Right tennis elbow 08/09/2013   Stautus post left total shoulder arthroplasty 11/26/2012   Fibromyalgia 08/15/2010   Chronic pain 08/15/2010   HYPOTHYROIDISM 05/15/2009   HYPERTENSION 05/15/2009   ALLERGIC RHINITIS 05/15/2009   DYSURIA 05/15/2009   COLONIC POLYPS, HX OF 05/15/2009   Kerin Perna, PTA 01/02/21 4:58 PM  [facility] [place], [place], [address] Phone: [phone]   Fax:  [phone]  Name: Sally [name] MRN: [id number] Date of Birth: 06-18-1945

## 2021-01-02 NOTE — Patient Instructions (Signed)
Aquatic Therapy: What to Expect! ° °Where:  °[facility] [address] °[address] °, [place] [address] °[phone]          ° °How to Prepare: ° °If you require assistance with dressing, with transportation (ie: wheel chair), or toileting, a caregiver must attend the entire session with you (unless your primary therapists feels this is not necessary).   °If there is thunder during your appointment, you will be asked to leave pool area. You have the option to finish your session in the physical therapy area near the gym. °Masks in the pool area are optional. Your face will remain dry during your session, so you are welcome to keep your mask on, if desired. You will be spaced at least 6 feet from other aquatic patients.  °Please bring your own swim towel to dry off with.   °There are Men's and Women's locker rooms with showers, as well as gender neutral bathrooms in the pool area.  °Please arrive IN YOUR SUIT and a few minutes prior to your appointment - this helps to avoid delays in starting your session. °Head to the pool and await your appointment on the bench on the pool deck. °Please make sure to attend to any toileting needs prior to entering the pool. °Once on the pool deck your therapist will ask you to sign the Patient  Consent and Assignment of Benefits form. °Your therapist may take your blood pressure prior to, during and after your session if indicated. °We usually try and create a home exercise program based on activities we do in the pool. Some patients do not want to or do not have the ability to participate in an aquatic home program - this is not a barrier in any way to you participating in aquatic therapy as part of your current therapy plan! ° °Appointments:  All sessions are 45 minutes ° °About the pool: °Entering the pool: °Your therapist will assist you if needed; there are two ways to enter the pool - stairs or a mechanical lift. Your therapist will determine  the most appropriate way for you. °Water temperature is usually around 91-95°.  There is a lap pool with a temperature around 84° °There may be other therapists and patients in the pool at the same time.  ° °Contact Info:            °To cancel appointment, please call [facility] at [phone] °If you are running late, please call [facility] at [phone]    °        °     ° ° °

## 2021-01-04 ENCOUNTER — Other Ambulatory Visit: Payer: Self-pay

## 2021-01-04 ENCOUNTER — Encounter: Payer: Self-pay | Admitting: Physical Therapy

## 2021-01-04 ENCOUNTER — Ambulatory Visit (INDEPENDENT_AMBULATORY_CARE_PROVIDER_SITE_OTHER): Payer: Medicare Other | Admitting: Physical Therapy

## 2021-01-04 DIAGNOSIS — M170 Bilateral primary osteoarthritis of knee: Secondary | ICD-10-CM | POA: Diagnosis present

## 2021-01-04 DIAGNOSIS — R262 Difficulty in walking, not elsewhere classified: Secondary | ICD-10-CM | POA: Diagnosis not present

## 2021-01-04 DIAGNOSIS — M6281 Muscle weakness (generalized): Secondary | ICD-10-CM

## 2021-01-04 NOTE — Therapy (Signed)
[place], [place], [address] Phone: [phone]   Fax:  [phone]  Physical Therapy Treatment  Patient Details  Name: Sally [name] MRN: [id number] Date of Birth: 1946-01-30 Referring Provider (PT): [place], [place]   Encounter Date: 01/04/2021   PT End of Session - 01/04/21 1408     Visit Number 5    Number of Visits 18    Date for PT Re-Evaluation 01/30/21    Authorization Type Medicare    Authorization - Visit Number 5    Authorization - Number of Visits 18    Progress Note Due on Visit 10    PT Start Time 1400    PT Stop Time 1440    PT Time Calculation (min) 40 min    Activity Tolerance Patient tolerated treatment well    Behavior During Therapy [facility] for tasks assessed/performed             Past Medical History:  Diagnosis Date   Allergy    Arthritis    "all over"   Chronic pain syndrome    Chronic UTI    Fibromyalgia    Hx of colonic polyps    Hypertension    Hypothyroidism    Obesity    Pre-diabetes    "barely" (12/22/2013)    Past Surgical History:  Procedure Laterality Date   [place]; [place] ARTHROSCOPY Right [place] ARTHROPLASTY Left 11/25/2012   Procedure: TOTAL SHOULDER ARTHROPLASTY;  Surgeon: Nita Sells, MD;  Location: [place];  Service: Orthopedics;  Laterality: Left;  LEFT TOTAL SHOULDER ARTHROPLASTY   TOTAL SHOULDER ARTHROPLASTY Right 12/22/2013   TOTAL SHOULDER ARTHROPLASTY Right 12/22/2013   Procedure: TOTAL SHOULDER ARTHROPLASTY;  Surgeon: Nita Sells, MD;  Location: [place];  Service: Orthopedics;  Laterality: Right;  Right shoulder total arthroplasty   TUBAL LIGATION  1985    There were no vitals filed for this visit.   Subjective  Assessment - 01/04/21 1405     Subjective "I can tell my knees are getting better".    Pertinent History bilat shoulder replacement    Limitations Sitting;House hold activities;Standing;Walking    Patient Stated Goals decrease pain and improve strength    Currently in Pain? Yes    Pain Score 4     Pain Location Knee    Pain Orientation Right;Left    Pain Descriptors / Indicators Aching    Aggravating Factors  stairs    Pain Relieving Factors recumbent bicycle, ice, meds               Pt seen for aquatic therapy today.  Treatment took place in water 3.25-4 ft in depth at the [employer] pool. Temp of water was 95.  Pt entered/exited the pool via stairs independently with step to pattern with bilat rail.  Treatment:    Holding on to [place]:  forward and backward gait - repeated during session.  Sidestepping.  Marching.  Heel raises x 20,  Squat with/without  hip abdct. Hamstring curls x 12 each leg.  Calf stretch x 2 reps of 15 sec each leg.   Holding on to rails:  Forward step up x 10 each leg.  Retro step up with LLE  x 5.  Quad stretch with dorsum of foot on 3rd step x 15 sec x 3 reps each side (difficulty tolerating). Hamstring stretch with foot on 2nd step.  With hands near support surface:  SLS x 10 sec x 2 reps each side.   Toe taps to first step x 10.   Sit to/ from stand at in pool bench.  Sitting on bench: LAQ each leg, alternating legs. Hip abdct/add scissors     Pt requires buoyancy for support and to offload joints with strengthening exercises. Viscosity of the water is needed for resistance of strengthening; water current perturbations provides challenge to standing balance unsupported, requiring increased core activation.     PT Long Term Goals - 01/02/21 1658       PT LONG TERM GOAL #1   Title Pt will be independent with HEP    Time 6    Period Weeks    Status On-going      PT LONG TERM GOAL #2   Title Pt will improve FOTO to >= 64 to demo  improved functional mobility    Time 6    Period Weeks    Status On-going      PT LONG TERM GOAL #3   Title Pt will improve bilat LE strength to 4+/5 to improve standing and gait tolerance    Time 6    Period Weeks    Status Partially Met      PT LONG TERM GOAL #4   Title Pt will return to LE strengthening program at gym with knee pain <= 2/10    Time 6    Period Weeks    Status On-going                   Plan - 01/04/21 1553     Clinical Impression Statement Pt requires UE support on solid surface during session due to fear of water.  She was unable to walk holding onto floatation barbell - felt more relaxed holding Kozlowski.  Some difficulty tolerating quad stretch with foot on step behind her, but able to complete 3 reps each leg.  Pt reported slight drop in knee pain during session in water.    Personal Factors and Comorbidities Time since onset of injury/illness/exacerbation;Comorbidity 1    Examination-Activity Limitations Stairs;Squat;Stand;Transfers;Sit    Examination-Participation Restrictions Art gallery manager;Yard Work    Stability/Clinical Decision Making Stable/Uncomplicated    Rehab Potential Good    PT Frequency 3x / week    PT Duration 6 weeks    PT Treatment/Interventions Aquatic Therapy;Cryotherapy;Moist Heat;Electrical Stimulation;Gait Scientist, forensic;Therapeutic exercise;Therapeutic activities;Balance training;Neuromuscular re-education;Patient/family education;Manual techniques;Passive range of motion;Dry needling;Vasopneumatic Device;Taping    PT Next Visit Plan progress knee strength and stability.    PT Home Exercise Plan [email or web address]    Consulted and Agree with Plan of Care Patient             Patient will benefit from skilled therapeutic intervention in order to improve the following deficits and impairments:  Pain, Impaired flexibility, Decreased strength, Decreased activity tolerance, Decreased range of motion, Increased edema,  Difficulty walking, Abnormal gait, Decreased balance, Decreased mobility  Visit Diagnosis: Osteoarthritis of both knees, unspecified osteoarthritis type  Muscle weakness (generalized)  Difficulty in walking, not elsewhere classified     Problem List Patient Active Problem List   Diagnosis Date Noted   Arthritis of shoulder region, right, degenerative 12/22/2013   Osteoarthritis of right shoulder 08/09/2013   Right tennis elbow 08/09/2013   Stautus post  left total shoulder arthroplasty 11/26/2012   Fibromyalgia 08/15/2010   Chronic pain 08/15/2010   HYPOTHYROIDISM 05/15/2009   HYPERTENSION 05/15/2009   ALLERGIC RHINITIS 05/15/2009   DYSURIA 05/15/2009   COLONIC POLYPS, HX OF 05/15/2009    Kerin Perna, PTA 01/04/21 4:01 PM   [facility] [place], [place], [address] Phone: [phone]   Fax:  [phone]  Name: Sally [name] MRN: [id number] Date of Birth: 29-Jun-1945

## 2021-01-08 ENCOUNTER — Other Ambulatory Visit: Payer: Self-pay

## 2021-01-08 ENCOUNTER — Ambulatory Visit (INDEPENDENT_AMBULATORY_CARE_PROVIDER_SITE_OTHER): Payer: Medicare Other | Admitting: Physical Therapy

## 2021-01-08 DIAGNOSIS — R262 Difficulty in walking, not elsewhere classified: Secondary | ICD-10-CM

## 2021-01-08 DIAGNOSIS — M6281 Muscle weakness (generalized): Secondary | ICD-10-CM

## 2021-01-08 DIAGNOSIS — M170 Bilateral primary osteoarthritis of knee: Secondary | ICD-10-CM | POA: Diagnosis present

## 2021-01-08 NOTE — Therapy (Signed)
[place], [place], [address] Phone: [phone]   Fax:  [phone]  Physical Therapy Treatment  Patient Details  Name: Sally [name] MRN: [id number] Date of Birth: [birth year] Referring Provider (PT): [place], [place]   Encounter Date: 01/08/2021   PT End of Session - 01/08/21 1157     Visit Number 6    Number of Visits 18    Date for PT Re-Evaluation 01/30/21    Authorization Type Medicare    Authorization - Visit Number 6    Authorization - Number of Visits 18    Progress Note Due on Visit 10    PT Start Time 1150    PT Stop Time 1228    PT Time Calculation (min) 38 min    Activity Tolerance Patient tolerated treatment well    Behavior During Therapy [facility] for tasks assessed/performed             Past Medical History:  Diagnosis Date   Allergy    Arthritis    "all over"   Chronic pain syndrome    Chronic UTI    Fibromyalgia    Hx of colonic polyps    Hypertension    Hypothyroidism    Obesity    Pre-diabetes    "barely" (12/22/2013)    Past Surgical History:  Procedure Laterality Date   [place]; [place] ARTHROSCOPY Right [place] Left 11/25/2012   Procedure: TOTAL SHOULDER ARTHROPLASTY;  Surgeon: Nita Sells, MD;  Location: [place];  Service: Orthopedics;  Laterality: Left;  LEFT TOTAL SHOULDER ARTHROPLASTY   TOTAL SHOULDER ARTHROPLASTY Right 12/22/2013   TOTAL SHOULDER ARTHROPLASTY Right 12/22/2013   Procedure: TOTAL SHOULDER ARTHROPLASTY;  Surgeon: Nita Sells, MD;  Location: [place];  Service: Orthopedics;  Laterality: Right;  Right shoulder total arthroplasty   TUBAL LIGATION  1985    There were no vitals filed for this visit.   Subjective  Assessment - 01/08/21 1152     Subjective Pt reports she felt "aweful" after aquatic therapy session. She thinks maybe she overheated at pool.  She rode her stationary bicycle last night and "initially it was grinding, but then it wasn't anymore by the end of the 15 minutes".  Pt not interested in any more aquatic appts.    Pertinent History bilat shoulder replacement    Patient Stated Goals decrease pain and improve strength    Currently in Pain? Yes    Pain Score 4    Lt - 4-5/10,  Rt 3-4/10   Pain Location Knee    Pain Orientation Left;Right    Pain Descriptors / Indicators Aching    Aggravating Factors  stairs    Pain Relieving Factors bicycle, ice, meds                [facility] PT Assessment - 01/08/21 0001       Assessment   Medical Diagnosis bilat knee OA    Referring Provider (PT) mccauley, larry    Onset Date/Surgical Date 11/09/20    Next MD Visit 01/28/21    Prior Therapy for bilat shoulders      Strength   Strength Assessment Site Hip    Right/Left Hip Right;Left  Right Hip ABduction 4+/5    Left Hip ABduction 4-/5      Flexibility   Quadriceps LLE: 80, RLe 90               [facility] Adult PT Treatment/Exercise - 01/08/21 0001       Knee/Hip Exercises: Stretches   Passive Hamstring Stretch Right;Left;5 reps;10 seconds   foot on 12" step with pt applying over pressure, followed by forward lunge.   Quad Stretch Left;Right;3 reps;30 seconds   prone with strap   Hip Flexor Stretch Right;Left;2 reps;20 seconds    Gastroc Stretch Both;2 reps;20 seconds   incline board   Other Knee/Hip Stretches standing adductor stretch holding onto counter x 10 sec x 5 reps each leg      Knee/Hip Exercises: Aerobic   Stationary Bike L1: 5 min for warm up at beginning of session.    Other Aerobic single laps around gym in between exercises to assess response to exercise / reduce stiffness.      Knee/Hip Exercises: Standing   Functional Squat 1 set;5 reps   lifting 10# KB  off of 6" step. cues for hip hinge     Knee/Hip Exercises: Sidelying   Hip ABduction Strengthening;Right;Left;1 set;10 reps   tactile cues for form.     Knee/Hip Exercises: Prone   Hamstring Curl 2 sets;5 reps    Prone Knee Hang 1 minute;3 minutes   2 reps                         PT Long Term Goals - 01/02/21 1658       PT LONG TERM GOAL #1   Title Pt will be independent with HEP    Time 6    Period Weeks    Status On-going      PT LONG TERM GOAL #2   Title Pt will improve FOTO to >= 64 to demo improved functional mobility    Time 6    Period Weeks    Status On-going      PT LONG TERM GOAL #3   Title Pt will improve bilat LE strength to 4+/5 to improve standing and gait tolerance    Time 6    Period Weeks    Status Partially Met      PT LONG TERM GOAL #4   Title Pt will return to LE strengthening program at gym with knee pain <= 2/10    Time 6    Period Weeks    Status On-going                   Plan - 01/08/21 1223     Clinical Impression Statement Limited tolerance for weighted squat (with 10# KB). Cues given to hold stretches a little longer to make lasting changes to tissue.  Overall, all other exercises tolerated well- reported pain reduction to 2/10 at end of session.    Personal Factors and Comorbidities Time since onset of injury/illness/exacerbation;Comorbidity 1    Examination-Activity Limitations Stairs;Squat;Stand;Transfers;Sit    Examination-Participation Restrictions Art gallery manager;Yard Work    Stability/Clinical Decision Making Stable/Uncomplicated    Rehab Potential Good    PT Frequency 3x / week    PT Duration 6 weeks    PT Treatment/Interventions Aquatic Therapy;Cryotherapy;Moist Heat;Electrical Stimulation;Gait Scientist, forensic;Therapeutic exercise;Therapeutic activities;Balance training;Neuromuscular re-education;Patient/family education;Manual techniques;Passive range of motion;Dry  needling;Vasopneumatic Device;Taping    PT Next Visit Plan progress knee strength and stability.    PT Home Exercise  Plan [id number]    Consulted and Agree with Plan of Care Patient             Patient will benefit from skilled therapeutic intervention in order to improve the following deficits and impairments:  Pain, Impaired flexibility, Decreased strength, Decreased activity tolerance, Decreased range of motion, Increased edema, Difficulty walking, Abnormal gait, Decreased balance, Decreased mobility  Visit Diagnosis: Osteoarthritis of both knees, unspecified osteoarthritis type  Muscle weakness (generalized)  Difficulty in walking, not elsewhere classified     Problem List Patient Active Problem List   Diagnosis Date Noted   Arthritis of shoulder region, right, degenerative 12/22/2013   Osteoarthritis of right shoulder 08/09/2013   Right tennis elbow 08/09/2013   Stautus post left total shoulder arthroplasty 11/26/2012   Fibromyalgia 08/15/2010   Chronic pain 08/15/2010   HYPOTHYROIDISM 05/15/2009   HYPERTENSION 05/15/2009   ALLERGIC RHINITIS 05/15/2009   DYSURIA 05/15/2009   COLONIC POLYPS, HX OF 05/15/2009   Kerin Perna, PTA 01/08/21 1:00 PM  [place], [place], [address] Phone: [phone]   Fax:  [phone]  Name: Sally [name] MRN: [id number] Date of Birth: December 02, 1945

## 2021-01-09 ENCOUNTER — Ambulatory Visit (INDEPENDENT_AMBULATORY_CARE_PROVIDER_SITE_OTHER): Payer: Medicare Other | Admitting: Physical Therapy

## 2021-01-09 DIAGNOSIS — R262 Difficulty in walking, not elsewhere classified: Secondary | ICD-10-CM | POA: Diagnosis not present

## 2021-01-09 DIAGNOSIS — M6281 Muscle weakness (generalized): Secondary | ICD-10-CM

## 2021-01-09 DIAGNOSIS — M170 Bilateral primary osteoarthritis of knee: Secondary | ICD-10-CM | POA: Diagnosis present

## 2021-01-09 NOTE — Therapy (Signed)
Sally [name]  [place], [place], [address] Phone: [phone]   Fax:  [phone]  Physical Therapy Treatment  Patient Details  Name: Sally [name] MRN: [id number] Date of Birth: 1945/12/08 Referring Provider (PT): [place], [place]   Encounter Date: 01/09/2021   PT End of Session - 01/09/21 1230     Visit Number 7    Number of Visits 18    Date for PT Re-Evaluation 01/30/21    Authorization Type Medicare    Authorization - Visit Number 7    Authorization - Number of Visits 18    Progress Note Due on Visit 10    PT Start Time 1150    PT Stop Time 1230    PT Time Calculation (min) 40 min    Activity Tolerance Patient tolerated treatment well    Behavior During Therapy [facility] for tasks assessed/performed             Past Medical History:  Diagnosis Date   Allergy    Arthritis    "all over"   Chronic pain syndrome    Chronic UTI    Fibromyalgia    Hx of colonic polyps    Hypertension    Hypothyroidism    Obesity    Pre-diabetes    "barely" (12/22/2013)    Past Surgical History:  Procedure Laterality Date   [place]; [place] ARTHROSCOPY Right [place] ARTHROPLASTY Left 11/25/2012   Procedure: TOTAL SHOULDER ARTHROPLASTY;  Surgeon: Nita Sells, MD;  Location: [place];  Service: Orthopedics;  Laterality: Left;  LEFT TOTAL SHOULDER ARTHROPLASTY   TOTAL SHOULDER ARTHROPLASTY Right 12/22/2013   TOTAL SHOULDER ARTHROPLASTY Right 12/22/2013   Procedure: TOTAL SHOULDER ARTHROPLASTY;  Surgeon: Nita Sells, MD;  Location: [place];  Service: Orthopedics;  Laterality: Right;  Right shoulder total arthroplasty   TUBAL LIGATION  1985    There were no vitals filed for this visit.   Subjective  Assessment - 01/09/21 1153     Subjective Pt states that she was sore yesterday after PT on Wednesday. She thinks her arthritis is "flared up"    Patient Stated Goals decrease pain and improve strength    Currently in Pain? Yes    Pain Score 6     Pain Location Knee    Pain Orientation Left;Right    Pain Descriptors / Indicators Sore    Pain Type Chronic pain                [facility] PT Assessment - 01/09/21 0001       Assessment   Medical Diagnosis bilat knee OA    Referring Provider (PT) mccauley, larry    Onset Date/Surgical Date 11/09/20    Next MD Visit 01/28/21    Prior Therapy for bilat shoulders      Flexibility   Quadriceps bilat LE to 90 degrees                           [facility] Adult PT Treatment/Exercise - 01/09/21 0001       Knee/Hip Exercises: Stretches   Passive Hamstring Stretch Right;Left;5 reps;10 seconds   foot on 12" step with pt applying over pressure,  followed by forward lunge.   Quad Stretch Left;Right;3 reps;30 seconds   prone with strap   Hip Flexor Stretch Right;Left;2 reps;20 seconds    Gastroc Stretch Both;2 reps;20 seconds   incline board   Other Knee/Hip Stretches standing adductor stretch holding onto counter x 10 sec x 5 reps each leg      Knee/Hip Exercises: Aerobic   Stationary Bike L2 x 5 min for warm up    Other Aerobic single laps around gym in between exercises to assess response to exercise / reduce stiffness.      Knee/Hip Exercises: Standing   Walking with Sports Cord sidestep red TB 2 x 15 steps bilat      Knee/Hip Exercises: Seated   Sit to Sand 10 reps;without UE support      Knee/Hip Exercises: Prone   Hamstring Curl 2 sets;5 reps    Hip Extension Limitations 3 reps bilat LE      Manual Therapy   Manual Therapy Soft tissue mobilization    Soft tissue mobilization STM bilat hamstrings to reduce mm spasticity and reduce pain                          PT Long Term Goals - 01/02/21 1658        PT LONG TERM GOAL #1   Title Pt will be independent with HEP    Time 6    Period Weeks    Status On-going      PT LONG TERM GOAL #2   Title Pt will improve FOTO to >= 64 to demo improved functional mobility    Time 6    Period Weeks    Status On-going      PT LONG TERM GOAL #3   Title Pt will improve bilat LE strength to 4+/5 to improve standing and gait tolerance    Time 6    Period Weeks    Status Partially Met      PT LONG TERM GOAL #4   Title Pt will return to LE strengthening program at gym with knee pain <= 2/10    Time 6    Period Weeks    Status On-going                   Plan - 01/09/21 1231     Clinical Impression Statement Pt with good tolerance to sit to stand without UEs and sidestep with red TB to add strengthening to program. She demos improving knee ROM and strength    PT Next Visit Plan progress knee strength and stability.    PT Home Exercise Plan [id number]    Consulted and Agree with Plan of Care Patient             Patient will benefit from skilled therapeutic intervention in order to improve the following deficits and impairments:     Visit Diagnosis: Osteoarthritis of both knees, unspecified osteoarthritis type  Muscle weakness (generalized)  Difficulty in walking, not elsewhere classified     Problem List Patient Active Problem List   Diagnosis Date Noted   Arthritis of shoulder region, right, degenerative 12/22/2013   Osteoarthritis of right shoulder 08/09/2013   Right tennis elbow 08/09/2013   Stautus post left total shoulder arthroplasty 11/26/2012   Fibromyalgia 08/15/2010   Chronic pain 08/15/2010   HYPOTHYROIDISM 05/15/2009   HYPERTENSION 05/15/2009   ALLERGIC RHINITIS 05/15/2009   DYSURIA 05/15/2009   COLONIC POLYPS, HX OF 05/15/2009  [name], PT 01/09/2021, 12:32 PM  [facility] [place], [place], [address] Phone:  [phone]   Fax:  [phone]  Name: Sally [name] MRN: [id number] Date of Birth: [birth year]

## 2021-01-11 ENCOUNTER — Encounter: Payer: Medicare Other | Admitting: Physical Therapy

## 2021-01-15 ENCOUNTER — Ambulatory Visit (INDEPENDENT_AMBULATORY_CARE_PROVIDER_SITE_OTHER): Payer: Medicare Other | Admitting: Physical Therapy

## 2021-01-15 ENCOUNTER — Other Ambulatory Visit: Payer: Self-pay

## 2021-01-15 DIAGNOSIS — M6281 Muscle weakness (generalized): Secondary | ICD-10-CM

## 2021-01-15 DIAGNOSIS — R262 Difficulty in walking, not elsewhere classified: Secondary | ICD-10-CM

## 2021-01-15 DIAGNOSIS — M170 Bilateral primary osteoarthritis of knee: Secondary | ICD-10-CM

## 2021-01-15 NOTE — Therapy (Signed)
[place], [place], [address] Phone: [phone]   Fax:  [phone]  Physical Therapy Treatment  Patient Details  Name: Sally [name] MRN: [id number] Date of Birth: 1945/10/03 Referring Provider (PT): [place], [place]   Encounter Date: 01/15/2021   PT End of Session - 01/15/21 1154     Visit Number 8    Number of Visits 18    Date for PT Re-Evaluation 01/30/21    Authorization Type Medicare    Authorization - Visit Number 8    Authorization - Number of Visits 18    Progress Note Due on Visit 10    PT Start Time 1150    PT Stop Time 1229    PT Time Calculation (min) 39 min    Activity Tolerance Patient tolerated treatment well    Behavior During Therapy [facility] for tasks assessed/performed             Past Medical History:  Diagnosis Date   Allergy    Arthritis    "all over"   Chronic pain syndrome    Chronic UTI    Fibromyalgia    Hx of colonic polyps    Hypertension    Hypothyroidism    Obesity    Pre-diabetes    "barely" (12/22/2013)    Past Surgical History:  Procedure Laterality Date   [place]; [place] ARTHROSCOPY Right [place] ARTHROPLASTY Left 11/25/2012   Procedure: TOTAL SHOULDER ARTHROPLASTY;  Surgeon: Nita Sells, MD;  Location: [place];  Service: Orthopedics;  Laterality: Left;  LEFT TOTAL SHOULDER ARTHROPLASTY   TOTAL SHOULDER ARTHROPLASTY Right 12/22/2013   TOTAL SHOULDER ARTHROPLASTY Right 12/22/2013   Procedure: TOTAL SHOULDER ARTHROPLASTY;  Surgeon: Nita Sells, MD;  Location: [place];  Service: Orthopedics;  Laterality: Right;  Right shoulder total arthroplasty   TUBAL LIGATION  1985    There were no vitals filed for this visit.   Subjective  Assessment - 01/15/21 1153     Subjective "I'm stiff as a board".    Pertinent History bilat shoulder replacement    Patient Stated Goals decrease pain and improve strength    Currently in Pain? Yes    Pain Score 6     Pain Location Generalized    Pain Descriptors / Indicators Aching    Aggravating Factors  stairs    Pain Relieving Factors bicycle, ice, meds.                [facility] PT Assessment - 01/15/21 0001       Assessment   Medical Diagnosis bilat knee OA    Referring Provider (PT) mccauley, larry    Onset Date/Surgical Date 11/09/20    Next MD Visit 01/28/21    Prior Therapy for bilat shoulders              [facility] Adult PT Treatment/Exercise - 01/15/21 0001       Knee/Hip Exercises: Stretches   Passive Hamstring Stretch Right;Left;5 reps;10 seconds   foot on 12" step with pt applying over pressure, followed by forward lunge.   Quad Stretch Left;Right;3 reps;30 seconds   prone with strap   Gastroc Stretch Both;2 reps;20 seconds   incline board  Other Knee/Hip Stretches standing adductor stretch holding onto counter x 10 sec x 5 reps each leg      Knee/Hip Exercises: Aerobic   Stationary Bike L1: 5 min for warm up    Other Aerobic single laps around gym in between exercises to assess response to exercise / reduce stiffness.      Knee/Hip Exercises: Standing   Terminal Knee Extension Strengthening;Right;1 set;10 reps    Theraband Level (Terminal Knee Extension) Level 3 (Green)    SLS Rt/Lt x 10 sec each    Other Standing Knee Exercises tandem stance x 15 sec x 2 reps each      Knee/Hip Exercises: Supine   Bridges 10 reps   5 sec in ext     Knee/Hip Exercises: Prone   Hip Extension 5 reps;2 sets    Hip Extension Limitations LLE more difficult than Rt    Prone Knee Hang 1 minute;3 minutes   2 reps              PT Long Term Goals - 01/02/21 1658       PT LONG TERM GOAL #1   Title Pt will be independent with HEP    Time 6    Period Weeks     Status On-going      PT LONG TERM GOAL #2   Title Pt will improve FOTO to >= 64 to demo improved functional mobility    Time 6    Period Weeks    Status On-going      PT LONG TERM GOAL #3   Title Pt will improve bilat LE strength to 4+/5 to improve standing and gait tolerance    Time 6    Period Weeks    Status Partially Met      PT LONG TERM GOAL #4   Title Pt will return to LE strengthening program at gym with knee pain <= 2/10    Time 6    Period Weeks    Status On-going                   Plan - 01/15/21 1231     Clinical Impression Statement Increased general stiffness reduced with stretches and ROM exercises.  she continues to have limited bilat knee ext ROM; may benefit from manual therapy (PROM, DN, STM) to increase range.  Pt gradually progressing towards LTGs.    PT Frequency 3x / week    PT Duration 6 weeks    PT Treatment/Interventions Aquatic Therapy;Cryotherapy;Moist Heat;Electrical Stimulation;Gait training;Stair training;Therapeutic exercise;Therapeutic activities;Balance training;Neuromuscular re-education;Patient/family education;Manual techniques;Passive range of motion;Dry needling;Vasopneumatic Device;Taping    PT Next Visit Plan progress knee strength and stability.    PT Home Exercise Plan [id number]    Consulted and Agree with Plan of Care Patient             Patient will benefit from skilled therapeutic intervention in order to improve the following deficits and impairments:  Pain, Impaired flexibility, Decreased strength, Decreased activity tolerance, Decreased range of motion, Increased edema, Difficulty walking, Abnormal gait, Decreased balance, Decreased mobility  Visit Diagnosis: Osteoarthritis of both knees, unspecified osteoarthritis type  Muscle weakness (generalized)  Difficulty in walking, not elsewhere classified     Problem List Patient Active Problem List   Diagnosis Date Noted   Arthritis of shoulder region, right,  degenerative 12/22/2013   Osteoarthritis of right shoulder 08/09/2013   Right tennis elbow 08/09/2013   Stautus post left total shoulder arthroplasty 11/26/2012   Fibromyalgia  08/15/2010   Chronic pain 08/15/2010   HYPOTHYROIDISM 05/15/2009   HYPERTENSION 05/15/2009   ALLERGIC RHINITIS 05/15/2009   DYSURIA 05/15/2009   COLONIC POLYPS, HX OF 05/15/2009   Kerin Perna, PTA 01/15/21 12:43 PM  [place], [place], [address] Phone: [phone]   Fax:  [phone]  Name: Sally [name] MRN: [id number] Date of Birth: Jan 17, 1946

## 2021-01-16 ENCOUNTER — Encounter: Payer: Medicare Other | Admitting: Physical Therapy

## 2021-01-18 ENCOUNTER — Other Ambulatory Visit: Payer: Self-pay

## 2021-01-18 ENCOUNTER — Ambulatory Visit (INDEPENDENT_AMBULATORY_CARE_PROVIDER_SITE_OTHER): Payer: Medicare Other | Admitting: Physical Therapy

## 2021-01-18 DIAGNOSIS — R262 Difficulty in walking, not elsewhere classified: Secondary | ICD-10-CM | POA: Diagnosis not present

## 2021-01-18 DIAGNOSIS — M6281 Muscle weakness (generalized): Secondary | ICD-10-CM

## 2021-01-18 DIAGNOSIS — M170 Bilateral primary osteoarthritis of knee: Secondary | ICD-10-CM | POA: Diagnosis present

## 2021-01-18 NOTE — Therapy (Addendum)
[place], [place], [address] Phone: [phone]   Fax:  [phone]  Physical Therapy Treatment and Discharge  Patient Details  Name: Sally [name] MRN: [id number] Date of Birth: 1945/11/29 Referring Provider (PT): [place], [place]   Encounter Date: 01/18/2021   PT End of Session - 01/18/21 1326     Visit Number 9    Number of Visits 18    Date for PT Re-Evaluation 01/30/21    Authorization Type Medicare    Authorization - Visit Number 9    Authorization - Number of Visits 18    Progress Note Due on Visit 10    PT Start Time 1326   late arrival   PT Stop Time 1400    PT Time Calculation (min) 34 min    Activity Tolerance Patient tolerated treatment well    Behavior During Therapy [facility] for tasks assessed/performed             Past Medical History:  Diagnosis Date   Allergy    Arthritis    "all over"   Chronic pain syndrome    Chronic UTI    Fibromyalgia    Hx of colonic polyps    Hypertension    Hypothyroidism    Obesity    Pre-diabetes    "barely" (12/22/2013)    Past Surgical History:  Procedure Laterality Date   [place]; [place] ARTHROSCOPY Right [place] Left 11/25/2012   Procedure: TOTAL SHOULDER ARTHROPLASTY;  Surgeon: Nita Sells, MD;  Location: [place];  Service: Orthopedics;  Laterality: Left;  LEFT TOTAL SHOULDER ARTHROPLASTY   TOTAL SHOULDER ARTHROPLASTY Right 12/22/2013   TOTAL SHOULDER ARTHROPLASTY Right 12/22/2013   Procedure: TOTAL SHOULDER ARTHROPLASTY;  Surgeon: Nita Sells, MD;  Location: [place];  Service: Orthopedics;  Laterality: Right;  Right shoulder total arthroplasty   TUBAL LIGATION  1985    There were no vitals filed for  this visit.   Subjective Assessment - 01/18/21 1327     Subjective "If I'm on the bike every day I'm usually less stiff." I feel stiff. "I ordered my yoga strap"    Pertinent History bilat shoulder replacement    Limitations Sitting;House hold activities;Standing;Walking    How long can you sit comfortably? 20 minutes    How long can you walk comfortably? < 1 min    Patient Stated Goals decrease pain and improve strength    Currently in Pain? Yes    Pain Score 6     Pain Location Generalized    Pain Orientation Left;Right    Pain Descriptors / Indicators Aching    Pain Type Chronic pain    Pain Onset More than a month ago                               [facility] Adult PT Treatment/Exercise - 01/18/21 0001       Knee/Hip Exercises: Stretches   Passive Hamstring Stretch Right;Left;20 seconds;2 reps    Sports administrator Left;Right;30 seconds;2 reps    Hip Flexor Stretch Right;Left;2 reps;20 seconds      Knee/Hip Exercises: Aerobic   Stationary Bike L1: 5 min  for warm up      Knee/Hip Exercises: Standing   Walking with Sports Cord sidestep red TB 2 x 10 steps bilat    Other Standing Knee Exercises monster walk red tband forward & backward 2x10      Knee/Hip Exercises: Prone   Hamstring Curl 10 reps    Hip Extension 10 reps;AAROM    Other Prone Exercises quad set 2x10 each leg      Manual Therapy   Soft tissue mobilization STM bilat hamstrings to reduce mm spasticity and reduce pain                          PT Long Term Goals - 01/02/21 1658       PT LONG TERM GOAL #1   Title Pt will be independent with HEP    Time 6    Period Weeks    Status On-going      PT LONG TERM GOAL #2   Title Pt will improve FOTO to >= 64 to demo improved functional mobility    Time 6    Period Weeks    Status On-going      PT LONG TERM GOAL #3   Title Pt will improve bilat LE strength to 4+/5 to improve standing and gait tolerance    Time 6    Period Weeks     Status Partially Met      PT LONG TERM GOAL #4   Title Pt will return to LE strengthening program at gym with knee pain <= 2/10    Time 6    Period Weeks    Status On-going                   Plan - 01/18/21 1331     Clinical Impression Statement Treatment focused on continuing to improve ROM and stretching. Performed manual therapy. Continued to work on Scientist, research (physical sciences).    Personal Factors and Comorbidities Time since onset of injury/illness/exacerbation;Comorbidity 1    Examination-Activity Limitations Stairs;Squat;Stand;Transfers;Sit    Examination-Participation Restrictions Art gallery manager;Yard Work    PT Frequency 3x / week    PT Duration 6 weeks    PT Treatment/Interventions Aquatic Nurse, learning disability;Therapeutic exercise;Therapeutic activities;Balance training;Neuromuscular re-education;Patient/family education;Manual techniques;Passive range of motion;Dry needling;Vasopneumatic Device;Taping    PT Next Visit Plan progress knee strength and stability.    PT Home Exercise Plan [id number]    Consulted and Agree with Plan of Care Patient             Patient will benefit from skilled therapeutic intervention in order to improve the following deficits and impairments:  Pain, Impaired flexibility, Decreased strength, Decreased activity tolerance, Decreased range of motion, Increased edema, Difficulty walking, Abnormal gait, Decreased balance, Decreased mobility  Visit Diagnosis: Osteoarthritis of both knees, unspecified osteoarthritis type  Muscle weakness (generalized)  Difficulty in walking, not elsewhere classified     Problem List Patient Active Problem List   Diagnosis Date Noted   Arthritis of shoulder region, right, degenerative 12/22/2013   Osteoarthritis of right shoulder 08/09/2013   Right tennis elbow 08/09/2013   Stautus post left total shoulder arthroplasty  11/26/2012   Fibromyalgia 08/15/2010   Chronic pain 08/15/2010   HYPOTHYROIDISM 05/15/2009   HYPERTENSION 05/15/2009   ALLERGIC RHINITIS 05/15/2009   DYSURIA 05/15/2009   COLONIC POLYPS, HX OF 05/15/2009  PHYSICAL THERAPY DISCHARGE SUMMARY  Visits from Start of Care: 9  Current functional level related to  goals / functional outcomes: Improving activity tolerance   Remaining deficits: Decreased strength and ROM   Education / Equipment: HEP   Patient agrees to discharge. Patient goals were not met. Patient is being discharged due to not returning since the last visit. Isabelle Course, PT,DPT12/21/222:16 PM    [facility] [address] PT, DPT 01/18/2021, 2:02 PM  [facility] [place], [place], [address] Phone: [phone]   Fax:  [phone]  Name: Sally [name] MRN: [id number] Date of Birth: September 17, 1945

## 2021-01-22 ENCOUNTER — Encounter: Payer: Medicare Other | Admitting: Physical Therapy

## 2021-01-30 ENCOUNTER — Encounter: Payer: Medicare Other | Admitting: Physical Therapy

## 2021-02-01 ENCOUNTER — Encounter: Payer: Medicare Other | Admitting: Physical Therapy

## 2021-02-21 ENCOUNTER — Ambulatory Visit: Payer: Medicare Other | Admitting: Physical Therapy

## 2021-07-06 ENCOUNTER — Emergency Department (INDEPENDENT_AMBULATORY_CARE_PROVIDER_SITE_OTHER)
Admission: EM | Admit: 2021-07-06 | Discharge: 2021-07-06 | Disposition: A | Discharge status: Home / Self Care | Payer: Medicare Other | Source: Home / Self Care

## 2021-07-06 ENCOUNTER — Other Ambulatory Visit: Payer: Self-pay

## 2021-07-06 DIAGNOSIS — N3001 Acute cystitis with hematuria: Secondary | ICD-10-CM | POA: Diagnosis not present

## 2021-07-06 LAB — POCT URINALYSIS DIP (MANUAL ENTRY)
Glucose, UA: 250 mg/dL — AB
Nitrite, UA: POSITIVE — AB
Protein Ur, POC: 100 mg/dL — AB
Spec Grav, UA: <=1.005 — AB (ref 1.010–1.025)
Urobilinogen, UA: 4 E.U./dL — AB
pH, UA: 5 (ref 5.0–8.0)

## 2021-07-06 MED ORDER — CIPROFLOXACIN HCL 250 MG PO TABS: 250.0000 mg | ORAL_TABLET | Freq: Two times a day (BID) | ORAL | 0 refills | Status: AC

## 2021-07-06 NOTE — Discharge Instructions (Addendum)
Instructed patient to take medication as directed with food to completion.  Encouraged patient to increase daily water intake while taking this medication.  Advised patient we will follow-up with urine culture results once received. 

## 2021-07-06 NOTE — ED Triage Notes (Signed)
Pt presents to Urgent Care with c/o dysuria w/ mild hematuria x 2 days. Has been taking AZO.  ?

## 2021-07-06 NOTE — ED Provider Notes (Signed)
?[place] ? ? ? ?CSN: [id number] ?Arrival date & time: 07/06/21  1253 ? ? ?  ? ?History   ?Chief Complaint ?Chief Complaint  ?Patient presents with  ? Dysuria  ? Hematuria  ? ? ?HPI ?Sally [name] is a 76 y.o. female.  ? ?HPI Pleasant 76 year old female presents with dysuria, hematuria for 2 days and reports taking an OTC AZO.  PMH significant for obesity, chronic pain syndrome, chronic UTI, HTN and fibromyalgia ? ?Past Medical History:  ?Diagnosis Date  ? Allergy   ? Arthritis   ? "all over"  ? Chronic pain syndrome   ? Chronic UTI   ? Fibromyalgia   ? Hx of colonic polyps   ? Hypertension   ? Hypothyroidism   ? Obesity   ? Pre-diabetes   ? "barely" (12/22/2013)  ? ? ?Patient Active Problem List  ? Diagnosis Date Noted  ? Arthritis of shoulder region, right, degenerative 12/22/2013  ? Osteoarthritis of right shoulder 08/09/2013  ? Right tennis elbow 08/09/2013  ? [place] post left total shoulder arthroplasty 11/26/2012  ? Fibromyalgia 08/15/2010  ? Chronic pain 08/15/2010  ? HYPOTHYROIDISM 05/15/2009  ? HYPERTENSION 05/15/2009  ? ALLERGIC RHINITIS 05/15/2009  ? DYSURIA 05/15/2009  ? COLONIC POLYPS, HX OF 05/15/2009  ? ? ?Past Surgical History:  ?Procedure Laterality Date  ? ABDOMINAL HYSTERECTOMY  1990  ? APPENDECTOMY  1965  ? [place]; 1985  ? CHOLECYSTECTOMY  1982  ? DILATION AND CURETTAGE OF UTERUS  1990  ? FOOT SURGERY  1992  ? KNEE ARTHROSCOPY Right 1992  ? TONSILLECTOMY  1959  ? TOTAL SHOULDER ARTHROPLASTY Left 11/25/2012  ? Procedure: TOTAL SHOULDER ARTHROPLASTY;  Surgeon: Nita Sells, MD;  Location: [place];  Service: Orthopedics;  Laterality: Left;  LEFT TOTAL SHOULDER ARTHROPLASTY  ? TOTAL SHOULDER ARTHROPLASTY Right 12/22/2013  ? TOTAL SHOULDER ARTHROPLASTY Right 12/22/2013  ? Procedure: TOTAL SHOULDER ARTHROPLASTY;  Surgeon: Nita Sells, MD;  Location: [place];  Service: Orthopedics;  Laterality: Right;  Right shoulder total arthroplasty  ? TUBAL LIGATION   1985  ? ? ?OB History   ? ? Gravida  ?7  ? Para  ?6  ? Term  ?6  ? Preterm  ?   ? AB  ?   ? Living  ?6  ?  ? ? SAB  ?   ? IAB  ?   ? Ectopic  ?   ? Multiple  ?   ? Live Births  ?   ?   ?  ?  ? ? ? ?Home Medications   ? ?Prior to Admission medications   ?Medication Sig Start Date End Date Taking? Authorizing Provider  ?ciprofloxacin (CIPRO) 250 MG tablet Take 1 tablet (250 mg total) by mouth every 12 (twelve) hours for 7 days. 07/06/21 07/13/21 Yes Eliezer Lofts, FNP  ?Ascorbic Acid (VITAMIN C PO) Take 1 tablet by mouth daily.    [provider]  ?B Complex Vitamins (VITAMIN-B COMPLEX PO) Take 1 tablet by mouth daily.    [provider]  ?BIOTIN 5000 PO Take 5,000 Units by mouth 2 (two) times a week.     [provider]  ?Diethylpropion HCl 25 MG TABS Take 75 mg by mouth daily.    [provider]  ?fluticasone (FLONASE) 50 MCG/ACT nasal spray Place 2 sprays into both nostrils daily. 08/22/15   Noe Gens, PA-C  ?Magnesium 250 MG TABS Take 250 mg by mouth 2 (two) times  daily.    [provider]  ?Multiple Vitamins-Minerals (MULTIVITAMIN PO) Take 1 tablet by mouth daily.    [provider]  ?OVER THE COUNTER MEDICATION Take 50 mg by mouth 2 (two) times a week. DHEA 50 mg    [provider]  ?OxyCODONE (OXYCONTIN) 20 mg T12A 12 hr tablet Take 1 tablet (20 mg total) by mouth every 8 (eight) hours. ?Patient taking differently: Take 20 mg by mouth every 8 (eight) hours. 10 mg 12/23/13   Grier Mitts, PA-C  ?oxyCODONE-acetaminophen (PERCOCET) 10-325 MG per tablet Take 1 tablet by mouth 3 (three) times daily.     [provider]  ?phenazopyridine (PYRIDIUM) 200 MG tablet Take 1 tablet by mouth every 6-8 hours if needed for urinary pain 07/29/14   Jacqulyn Cane, MD  ?telmisartan (MICARDIS) 40 MG tablet Take 20 mg by mouth daily.     [provider]  ?thyroid (ARMOUR) 60 MG tablet Take 120 mg by mouth daily before breakfast.    [provider]  ?Vitamin D, Ergocalciferol, (DRISDOL) 50000 UNITS CAPS capsule Take 50,000 Units by mouth every 7 (seven) days.    [provider]  ? ? ?Family History ?Family History  ?Problem Relation Age of Onset  ? Cancer Mother 42  ?     pancratic  ? Cancer Father 66  ?     hematologic malignancy   ? Arthritis Sister   ? Arthritis Sister   ? Hypertension Unknown   ?     Family history  ? Thyroid disease Unknown   ?     family history  ? ? ?Social History ?Social History  ? ?Tobacco Use  ? Smoking status: Former  ?  Packs/day: 1.00  ?  Years: 7.00  ?  Pack years: 7.00  ?  Types: Cigarettes  ?  Quit date: 03/03/1970  ?  Years since quitting: 51.3  ? Smokeless tobacco: Never  ?Vaping Use  ? Vaping Use: Never used  ?Substance Use Topics  ? Alcohol use: No  ? Drug use: No  ? ? ? ?Allergies   ?Codeine and Doxycycline ? ? ?Review of Systems ?Review of Systems  ?Genitourinary:  Positive for dysuria and hematuria.  ? ? ?Physical Exam ?Triage Vital Signs ?ED Triage Vitals [07/06/21 1306]  ?Enc Vitals Group  ?   BP   ?   Pulse   ?   Resp   ?   Temp   ?   Temp src   ?   SpO2   ?   Weight 205 lb (93 kg)  ?   Height '5\' 7"'$  (1.702 m)  ?   Head Circumference   ?   Peak Flow   ?   Pain Score 10  ?   Pain Loc   ?   Pain Edu?   ?   Excl. in [place]?   ? ?No data found. ? ?Updated Vital Signs ?BP (!) 176/98 (BP Location: Right Arm)   Pulse 85   Temp 97.7 ?F (36.5 ?C) (Oral)   Resp 18   Ht '5\' 7"'$  (1.702 m)   Wt 205 lb (93 kg)   SpO2 96%   BMI 32.11 kg/m?  ? ?  ? ?Physical Exam ?Vitals and nursing note reviewed.  ?Constitutional:   ?   Appearance: Normal appearance. She is obese.  ?HENT:  ?   Head: Normocephalic and atraumatic.  ?   Mouth/Throat:  ?   Mouth: Mucous membranes are moist.  ?  Pharynx: Oropharynx is clear.  ?Eyes:  ?   Extraocular Movements: Extraocular movements intact.  ?   Conjunctiva/sclera: Conjunctivae normal.  ?   Pupils: Pupils are equal, round, and reactive to light.  ?Cardiovascular:  ?   Rate and  Rhythm: Normal rate and regular rhythm.  ?   Pulses: Normal pulses.  ?   Heart sounds: Normal heart sounds.  ?Pulmonary:  ?   Effort: Pulmonary effort is normal.  ?   Breath sounds: Normal breath sounds. No wheezing, rhonchi or rales.  ?Abdominal:  ?   Tenderness: There is no right CVA tenderness or left CVA tenderness.  ?Musculoskeletal:  ?   Cervical back: Normal range of motion and neck supple.  ?Skin: ?   General: Skin is warm and dry.  ?Neurological:  ?   General: No focal deficit present.  ?   Mental Status: She is alert and oriented to person, place, and time.  ? ? ? ?UC Treatments / Results  ?Labs ?(all labs ordered are listed, but only abnormal results are displayed) ?Labs Reviewed  ?POCT URINALYSIS DIP (MANUAL ENTRY) - Abnormal; Notable for the following components:  ?    Result Value  ? Color, UA orange (*)   ? Clarity, UA cloudy (*)   ? Glucose, UA =250 (*)   ? Bilirubin, UA small (*)   ? Ketones, POC UA small (15) (*)   ? Spec Grav, UA <=1.005 (*)   ? Blood, UA small (*)   ? Protein Ur, POC =100 (*)   ? Urobilinogen, UA 4.0 (*)   ? Nitrite, UA Positive (*)   ? Leukocytes, UA Large (3+) (*)   ? All other components within normal limits  ?URINE CULTURE  ? ? ?EKG ? ? ?Radiology ?No results found. ? ?Procedures ?Procedures (including critical care time) ? ?Medications Ordered in UC ?Medications - No data to display ? ?Initial Impression / Assessment and Plan / UC Course  ?I have reviewed the triage vital signs and the nursing notes. ? ?Pertinent labs & imaging results that were available during my care of the patient were reviewed by me and considered in my medical decision making (see chart for details). ? ?  ? ?MDM: 1.  Acute cystitis with hematuria-Rx'd Cipro. Instructed patient to take medication as directed with food to completion.  Encouraged patient to increase daily water intake while taking this medication.  Advised patient we will follow-up with urine culture results once received.  Patient  discharged home, hemodynamically stable. ?Final Clinical Impressions(s) / UC Diagnoses  ? ?Final diagnoses:  ?Acute cystitis with hematuria  ? ? ? ?Discharge Instructions   ? ?  ?Instructed patient to take medication as di

## 2021-07-08 ENCOUNTER — Telehealth: Payer: Self-pay

## 2021-07-08 LAB — URINE CULTURE
MICRO NUMBER:: 13362609
Result:: NO GROWTH
SPECIMEN QUALITY:: ADEQUATE

## 2021-07-08 NOTE — Telephone Encounter (Signed)
Telephone call received from pt requesting Urine Culture Results from recent visit. Pt verifiers identified and no growth urine culture result shared with patient. Pt states she was told in clinic she was positive for nitrites. UA reviewed and this nursed asked pt if she had taken AZO prior to visit. Pt confirmed she had taken azo. This nurse shared with patient that AZO will cause abnormal UA results there for we will send out a urine culture for verification. Per pt, this nurse was "looking at it wrong and she will call back later". Pt abruptly ended phone call.  ?

## 2022-01-13 ENCOUNTER — Ambulatory Visit (INDEPENDENT_AMBULATORY_CARE_PROVIDER_SITE_OTHER): Payer: Medicare Other | Admitting: Bariatrics

## 2022-01-13 ENCOUNTER — Encounter: Payer: Self-pay | Admitting: Bariatrics

## 2022-01-13 VITALS — HR 101 | Temp 97.7°F | Ht 67.0 in | Wt 208.0 lb

## 2022-01-13 DIAGNOSIS — E038 Other specified hypothyroidism: Secondary | ICD-10-CM | POA: Diagnosis not present

## 2022-01-13 DIAGNOSIS — R5383 Other fatigue: Secondary | ICD-10-CM | POA: Diagnosis not present

## 2022-01-13 DIAGNOSIS — E668 Other obesity: Secondary | ICD-10-CM

## 2022-01-13 DIAGNOSIS — M159 Polyosteoarthritis, unspecified: Secondary | ICD-10-CM

## 2022-01-13 DIAGNOSIS — Z0289 Encounter for other administrative examinations: Secondary | ICD-10-CM

## 2022-01-13 DIAGNOSIS — Z6832 Body mass index (BMI) 32.0-32.9, adult: Secondary | ICD-10-CM

## 2022-01-13 DIAGNOSIS — E65 Localized adiposity: Secondary | ICD-10-CM | POA: Insufficient documentation

## 2022-01-13 DIAGNOSIS — E669 Obesity, unspecified: Secondary | ICD-10-CM

## 2022-01-27 ENCOUNTER — Encounter: Payer: Self-pay | Admitting: Bariatrics

## 2022-01-27 NOTE — Progress Notes (Signed)
Office: [phone]  /  Fax: [phone]   Initial Visit  Sally [name] was seen in clinic today to evaluate for obesity. She is interested in losing weight to improve overall health and reduce the risk of weight related complications. She presents today to review program treatment options, initial physical assessment, and evaluation.     She was referred by: Self-Referral  When asked what else they would like to accomplish? She states: Improve existing medical conditions and Improve quality of life  When asked how has your weight affected you? She states: Contributed to orthopedic problems or mobility issues and Having fatigue  Some associated conditions: Arthritis (right knee) and Other: Hypothyroidism  Contributing factors: Family history (mother)  Weight promoting medications identified: Steroids  Current nutrition plan: Portion control / smart choices and Other: increase vegetables  Current level of physical activity: Other: Gym  Current or previous pharmacotherapy: Buproprion  Response to medication: Other: currently taking   Past medical history includes:   Past Medical History:  Diagnosis Date   Allergy    Arthritis    "all over"   Chronic pain syndrome    Chronic UTI    Fibromyalgia    Hx of colonic polyps    Hypertension    Hypothyroidism    Obesity    Pre-diabetes    "barely" (12/22/2013)     Objective:   [name] (!) 101   Temp 97.7 F (36.5 C)   Ht '5\' 7"'$  (1.702 m)   Wt 208 lb (94.3 kg)   SpO2 96%   BMI 32.58 kg/m  She was weighed on the bioimpedance scale: Body mass index is 32.58 kg/m.  Peak Weight:280 lbs ,Visceral Fat Rating:14, Body Fat%:44.0, Weight trend over the last 12 months: Unchanged  General:  Alert, oriented and cooperative. Patient is in no acute distress.  Respiratory: Normal respiratory effort, no problems with respiration noted  Extremities: Normal range of motion.    Mental Status: Normal mood and affect. Normal behavior.  Normal judgment and thought content.   Assessment and Plan:  1. Visceral obesity Sally [name]'s visceral fat rating is 14.  Plan: Her goal is 13 or below for visceral fat rating.  She will begin her weight loss and exercise.  2. Other fatigue Sally [name] notes some activities are limited due to fatigue.  Plan: EKG will be done at her next future visit, as well as fasting labs.  - EKG 12-Lead  3. Other specified hypothyroidism Sally [name] is stable, and she is taking Armour Thyroid.  Plan: Sally [name] will continue her medications as directed.  4. Primary osteoarthritis involving multiple joints Sally [name] notes osteoarthritis in multiple sites and with increased walking.  Plan: She will continue to follow-up with her PCP.  5. Obesity, Current BMI 32.6 Patient will follow-up in 2 to 3 weeks.  Goal is to get down to 165 pounds at this time.   We reviewed weight, biometrics, associated medical conditions and contributing factors with patient. She would benefit from weight loss therapy via a modified calorie, low-carb, high-protein nutritional plan tailored to their REE (resting energy expenditure) which will be determined by indirect calorimetry.  We will also assess for cardiometabolic risk and nutritional derangements via fasting serologies at her next appointment.      Obesity Treatment / Action Plan:  Patient will work on garnering support from family and friends to begin weight loss journey. Will work on eliminating or reducing the presence of highly palatable, calorie dense foods in the home. Will complete provided nutritional  and psychosocial assessment questionnaire before the next appointment. Will be scheduled for indirect calorimetry to determine resting energy expenditure in a fasting state.  This will allow [place] to create a reduced calorie, high-protein meal plan to promote loss of fat mass while preserving muscle mass.  Obesity Education Performed Today:  She was weighed on the bioimpedance scale  and results were discussed and documented in the synopsis.  We discussed obesity as a disease and the importance of a more detailed evaluation of all the factors contributing to the disease.  We discussed the importance of long term lifestyle changes which include nutrition, exercise and behavioral modifications as well as the importance of customizing this to her specific health and social needs.  We discussed the benefits of reaching a healthier weight to alleviate the symptoms of existing conditions and reduce the risks of the biomechanical, metabolic and psychological effects of obesity.  Sally [name] appears to be in the action stage of change and states they are ready to start intensive lifestyle modifications and behavioral modifications.  35 minutes was spent today on this visit including the above counseling, pre-visit chart review, and post-visit documentation.  Reviewed by clinician on day of visit: allergies, medications, problem list, medical history, surgical history, family history, social history, and previous encounter notes.   Wilhemena Durie, am acting as transcriptionist for [employer], DO   I have reviewed the above documentation for accuracy and completeness, and I agree with the above. Sally Lesch, DO

## 2022-01-28 ENCOUNTER — Ambulatory Visit: Payer: Medicare Other | Admitting: Bariatrics

## 2022-02-05 ENCOUNTER — Ambulatory Visit: Payer: Medicare Other | Admitting: Bariatrics

## 2022-02-11 ENCOUNTER — Ambulatory Visit: Payer: Medicare Other | Admitting: Bariatrics

## 2022-02-12 ENCOUNTER — Ambulatory Visit: Payer: Medicare Other | Admitting: Bariatrics

## 2022-02-26 ENCOUNTER — Ambulatory Visit: Payer: Medicare Other | Admitting: Bariatrics

## 2022-04-20 ENCOUNTER — Ambulatory Visit: Payer: Self-pay

## 2022-07-07 ENCOUNTER — Other Ambulatory Visit: Payer: Self-pay | Admitting: Endocrinology

## 2022-07-07 DIAGNOSIS — E010 Iodine-deficiency related diffuse (endemic) goiter: Secondary | ICD-10-CM

## 2022-08-04 ENCOUNTER — Other Ambulatory Visit: Payer: Medicare Other

## 2022-11-24 ENCOUNTER — Other Ambulatory Visit: Payer: Self-pay | Admitting: Orthopedic Surgery

## 2022-11-24 DIAGNOSIS — M545 Low back pain, unspecified: Secondary | ICD-10-CM

## 2022-12-22 ENCOUNTER — Other Ambulatory Visit: Payer: Self-pay | Admitting: Family Medicine

## 2022-12-22 DIAGNOSIS — S32050D Wedge compression fracture of fifth lumbar vertebra, subsequent encounter for fracture with routine healing: Secondary | ICD-10-CM

## 2022-12-26 ENCOUNTER — Other Ambulatory Visit: Payer: Self-pay | Admitting: Family Medicine

## 2022-12-26 DIAGNOSIS — Z780 Asymptomatic menopausal state: Secondary | ICD-10-CM

## 2022-12-26 DIAGNOSIS — S32050D Wedge compression fracture of fifth lumbar vertebra, subsequent encounter for fracture with routine healing: Secondary | ICD-10-CM

## 2022-12-30 NOTE — Therapy (Unsigned)
OUTPATIENT PHYSICAL THERAPY THORACOLUMBAR EVALUATION   Patient Name: Sally [name] MRN: [id number] DOB:12-04-1945, 77 y.o., female Today's Date: 12/31/2022  END OF SESSION:  PT End of Session - 12/31/22 1603     Visit Number 1    Number of Visits 24    Date for PT Re-Evaluation 03/25/23    Authorization Type medicare A/B; BCBS federal secondary    Progress Note Due on Visit 10    PT Start Time 1315    PT Stop Time 1405    PT Time Calculation (min) 50 min    Activity Tolerance Patient tolerated treatment well             Past Medical History:  Diagnosis Date   Allergy    Arthritis    "all over"   Chronic pain syndrome    Chronic UTI    Fibromyalgia    Hx of colonic polyps    Hypertension    Hypothyroidism    Obesity    Pre-diabetes    "barely" (12/22/2013)   Past Surgical History:  Procedure Laterality Date   ABDOMINAL HYSTERECTOMY  1990   APPENDECTOMY  1965   CESAREAN SECTION  1980; 1985   CHOLECYSTECTOMY  1982   DILATION AND CURETTAGE OF UTERUS  1990   FOOT SURGERY  1992   KNEE ARTHROSCOPY Right 1992   TONSILLECTOMY  1959   TOTAL SHOULDER ARTHROPLASTY Left 11/25/2012   Procedure: TOTAL SHOULDER ARTHROPLASTY;  Surgeon: Mable Paris, MD;  Location: [facility] OR;  Service: Orthopedics;  Laterality: Left;  LEFT TOTAL SHOULDER ARTHROPLASTY   TOTAL SHOULDER ARTHROPLASTY Right 12/22/2013   TOTAL SHOULDER ARTHROPLASTY Right 12/22/2013   Procedure: TOTAL SHOULDER ARTHROPLASTY;  Surgeon: Mable Paris, MD;  Location: [facility] OR;  Service: Orthopedics;  Laterality: Right;  Right shoulder total arthroplasty   TUBAL LIGATION  1985   Patient Active Problem List   Diagnosis Date Noted   Visceral obesity 01/13/2022   Other fatigue 01/13/2022   Other specified hypothyroidism 01/13/2022   Primary osteoarthritis involving multiple joints 01/13/2022   Class 1 obesity with serious comorbidity and body mass index (BMI) of 32.0 to 32.9 in adult 01/13/2022    Arthritis of shoulder region, right, degenerative 12/22/2013   Osteoarthritis of right shoulder 08/09/2013   Right tennis elbow 08/09/2013   Status post total shoulder replacement 11/26/2012   Fibromyalgia 08/15/2010   Chronic pain 08/15/2010   HYPOTHYROIDISM 05/15/2009   HYPERTENSION 05/15/2009   ALLERGIC RHINITIS 05/15/2009   DYSURIA 05/15/2009   History of colonic polyps 05/15/2009    PCP: Dr Dorthey Sawyer   REFERRING PROVIDER: Dr Dorthey Sawyer   REFERRING DIAG: Wedge compression fracture L5  Rationale for Evaluation and Treatment: Rehabilitation  THERAPY DIAG:  Other low back pain  Muscle weakness (generalized)  Other symptoms and signs involving the musculoskeletal system  Other abnormalities of gait and mobility  ONSET DATE: 10/02/22  SUBJECTIVE:  SUBJECTIVE STATEMENT: Patient reports that she fell back on to the arm of her couch and injured her back/tailbone ~ 2- 3 months ago and has had some persistent pain in the tailbone since that time. She has increased pain with sitting on hard surfaces or driving. She also notices trouble with balance and feels clumsy, especially going down steps   PERTINENT HISTORY:  Bilat shoulder replacements; bilat OA knees; obesity  PAIN:  Are you having pain? Yes: NPRS scale: 3/10 Pain location: low back to tailbone  Pain description: aching; sharp at times  Aggravating factors: sitting on hard surface; driving  Relieving factors: rest; sometimes heat or ice   PRECAUTIONS: None  RED FLAGS: None   WEIGHT BEARING RESTRICTIONS: No  FALLS:  Has patient fallen in last 6 months? Yes. 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  DIAGNOSTIC FINDINGS:  None available   PATIENT SURVEYS:  FOTO 49; goal 57  SCREENING FOR RED FLAGS: Bowel or bladder incontinence: No Spinal tumors: No Cauda equina syndrome: No Compression fracture: No Abdominal aneurysm: No  COGNITION: Overall cognitive status: Within functional limits for tasks assessed     SENSATION: WFL  MUSCLE LENGTH: Hamstrings: Right 60  deg; Left 65 deg Tight hip flexors bilat R > L   POSTURE: rounded shoulders, forward head, decreased lumbar lordosis, increased thoracic kyphosis, flexed trunk , and upper body shifted to L; knees flexed    PALPATION: Tenderness and tightness R > l lateral sacral border into coccyx   LUMBAR ROM:   AROM eval  Flexion 90%  Extension 40%  Right lateral flexion 70%  Left lateral flexion 65%  Right rotation 40%  Left rotation 45%    (Blank rows = not tested)  LOWER EXTREMITY ROM:  bilat knee flexion and extension limited R > L    *measure knee flexion and extension at next visit   Active  Right eval Left eval  Hip flexion    Hip extension    Hip abduction    Hip adduction    Hip internal rotation    Hip external rotation    Knee flexion    Knee extension    Ankle dorsiflexion    Ankle plantarflexion    Ankle inversion    Ankle eversion     (Blank rows = not tested)  LOWER EXTREMITY MMT:    MMT Right eval Left eval  Hip flexion 4 5  Hip extension 3 3+  Hip abduction 3 4-  Hip adduction    Hip internal  rotation    Hip external rotation    Knee flexion    Knee extension 4 4  Ankle dorsiflexion    Ankle plantarflexion    Ankle inversion    Ankle eversion     (Blank rows = not tested)  LUMBAR SPECIAL TESTS:  Straight leg raise test: Negative and Slump test: Negative  FUNCTIONAL TESTS:  5 times sit to stand: 11.10 sec  SLS L 5 sec; R 2 sec   Berg balance score 48     [facility] PT Assessment - 12/31/22 0001       Standardized Balance Assessment   Standardized Balance Assessment Berg Balance Test      Berg Balance Test   Standing Unsupported Able to stand safely 2 minutes    Sitting with Back Unsupported but Feet Supported on Floor or Stool Able to sit safely and securely 2 minutes    Stand to Sit Sits safely with minimal use of hands    Transfers Able to transfer safely, minor use of hands    Standing Unsupported with Eyes Closed Able to stand 10 seconds safely    Standing Unsupported with Feet Together Able to place feet together independently and stand 1 minute safely    From Standing, Reach Forward with Outstretched Arm Can reach confidently >25 cm (10")    From Standing Position, Pick up Object from Floor Able to pick up shoe safely and easily    From Standing Position, Turn to Look Behind Over each Shoulder Looks behind from both sides and weight shifts well    Turn 360 Degrees Able to turn 360 degrees safely in 4 seconds or less    Standing Unsupported, Alternately Place Feet on Step/Stool Able to stand independently and safely and complete 8 steps in 20 seconds    Standing Unsupported, One Foot in Front Able to plae foot ahead of the other independently and hold 30 seconds    Standing on One Leg Tries to lift leg/unable to hold 3 seconds but remains standing independently               GAIT: Distance walked: 40 feet  Assistive device utilized: None Level of assistance: Complete Independence Comments: flexed forward posture   [facility] Adult PT Treatment:                                                 DATE: 12/31/22 Therapeutic Exercise: Supine Core activation 10 sec x 10  Prone  Glut set 10 sec x 5 Standing  SLS 10-15 sec x 2 R/L  Tandum standing 10 sec x 1 each LE forward  Self Care: Do not sit with legs or ankles crossed     PATIENT EDUCATION:  Education details: POC; HEP  Person educated: Patient Education method: Explanation, Demonstration, Tactile cues, Verbal cues, and Handouts Education comprehension: verbalized understanding, returned demonstration, verbal cues required, tactile cues required, and needs further education  HOME EXERCISE PROGRAM: Access Code: [facility] URL: [email or web address] Date: 12/31/2022 Prepared by: Corlis Leak  Exercises - Prone Gluteal Sets  - 1 x daily - 7 x weekly - 1 sets - 10 reps - 10 sec  hold - Prone Hip Extension  - 1 x daily - 7 x weekly - 1 sets - 10 reps - 3 sec  hold - Supine Transversus Abdominis Bracing with Pelvic Floor Contraction  - 2 x daily - 7 x weekly - 1 sets - 10 reps - 10sec  hold - Single Leg Stance with Support  - 2 x daily - 7 x weekly - 1 sets - 3-5 reps - 10-20 sec  hold - Tandem Stance with Support  - 2 x daily - 7 x weekly - 1 sets - 3-5 reps -  10-20 sec  hold  ASSESSMENT:  CLINICAL IMPRESSION: Patient is a 77 y.o. female who was seen today for physical therapy evaluation and treatment for wedge fracture L5 lumbar spine. She reports resolving pain primarily in the R sacral and coccyx area. She is most concerned with her balance and walking. She does not feel comfortable and secure with walking. Patient has history of bilat knee arthritis and vestibular dysfunction. She presents with poor posture and alignment; limited LE and trunk mobility/ROM; LE weakness; decreased stability with higher level balance activities; report of some dizziness with rolling; coccyx/sacral pain R>L. Patient will benefit from PT to address problems identified.   OBJECTIVE IMPAIRMENTS:  decreased activity tolerance, decreased balance, decreased mobility, decreased ROM, decreased strength, increased fascial restrictions, impaired flexibility, improper body mechanics, postural dysfunction, and pain.   ACTIVITY LIMITATIONS: sitting and driving  PARTICIPATION LIMITATIONS: driving  PERSONAL FACTORS: Behavior pattern, Fitness, Past/current experiences, and Time since onset of injury/illness/exacerbation are also affecting patient's functional outcome.   REHAB POTENTIAL: Good  CLINICAL DECISION MAKING: Stable/uncomplicated  EVALUATION COMPLEXITY: Low   GOALS: Goals reviewed with patient? Yes  SHORT TERM GOALS: Target date: 02/10/2023   Independent initial HEP  Baseline: Goal status: INITIAL  2.  Further assess higher level balance and vestibular disorder  Baseline:  Goal status: INITIAL   LONG TERM GOALS: Target date: 03/24/2023   Improve posture and alignment with patient to demonstrate more upright posture with increased hip and knee extension in standing  Baseline:  Goal status: INITIAL  2.  Increased LE strength to 4+/5 to 5/5  Baseline:  Goal status: INITIAL  3.  Increase knee extension  Baseline:  Goal status: INITIAL  4.  Patient reports no increased pain with sitting and/or driving > 1 hour  Baseline:  Goal status: INITIAL  5.  Independent in HEP, including aquatic program as indicated  Baseline:  Goal status: INITIAL  6.  Improve functional limitation score to 64 Baseline: 49 Goal status: INITIAL  PLAN:  PT FREQUENCY: 2x/week  PT DURATION: 12 weeks  PLANNED INTERVENTIONS: 97164- PT Re-evaluation, 97110-Therapeutic exercises, 97530- Therapeutic activity, 97112- Neuromuscular re-education, 97535- Self Care, 16109- Manual therapy, [id number]- Gait training, [phone]- Aquatic Therapy, 97014- Electrical stimulation (unattended), [id number]- Ultrasound, 09811- Ionotophoresis 4mg /ml Dexamethasone, Patient/Family education, Balance training, Stair  training, Taping, Dry Needling, Joint mobilization, Spinal mobilization, Cryotherapy, and Moist heat.  PLAN FOR NEXT SESSION: review and progress exercise; continue with back care and body mechanics education; manual work, DN, modalities as indicated.   *measure bilat knee flex/ext  *further assess vestibular disorder as MD orders (requested PT eval)   Satchel Heidinger Rober Minion, PT 12/31/2022, 4:04 PM

## 2022-12-31 ENCOUNTER — Other Ambulatory Visit: Payer: Self-pay

## 2022-12-31 ENCOUNTER — Ambulatory Visit: Payer: Medicare Other | Attending: Family Medicine | Admitting: Rehabilitative and Restorative Service Providers"

## 2022-12-31 ENCOUNTER — Encounter: Payer: Self-pay | Admitting: Rehabilitative and Restorative Service Providers"

## 2022-12-31 DIAGNOSIS — M5459 Other low back pain: Secondary | ICD-10-CM | POA: Diagnosis present

## 2022-12-31 DIAGNOSIS — R29898 Other symptoms and signs involving the musculoskeletal system: Secondary | ICD-10-CM

## 2022-12-31 DIAGNOSIS — M6281 Muscle weakness (generalized): Secondary | ICD-10-CM | POA: Diagnosis present

## 2022-12-31 DIAGNOSIS — R2689 Other abnormalities of gait and mobility: Secondary | ICD-10-CM

## 2023-01-02 ENCOUNTER — Ambulatory Visit: Payer: Medicare Other | Attending: Family Medicine

## 2023-01-02 DIAGNOSIS — R2689 Other abnormalities of gait and mobility: Secondary | ICD-10-CM | POA: Diagnosis present

## 2023-01-02 DIAGNOSIS — M170 Bilateral primary osteoarthritis of knee: Secondary | ICD-10-CM | POA: Diagnosis present

## 2023-01-02 DIAGNOSIS — M6281 Muscle weakness (generalized): Secondary | ICD-10-CM | POA: Insufficient documentation

## 2023-01-02 DIAGNOSIS — R29898 Other symptoms and signs involving the musculoskeletal system: Secondary | ICD-10-CM | POA: Insufficient documentation

## 2023-01-02 DIAGNOSIS — R262 Difficulty in walking, not elsewhere classified: Secondary | ICD-10-CM | POA: Diagnosis present

## 2023-01-02 DIAGNOSIS — M5459 Other low back pain: Secondary | ICD-10-CM | POA: Insufficient documentation

## 2023-01-02 NOTE — Therapy (Signed)
OUTPATIENT PHYSICAL THERAPY THORACOLUMBAR TREATMENT   Patient Name: Sally [name] MRN: [id number] DOB:Feb 01, 1946, 77 y.o., female Today's Date: 01/02/2023  END OF SESSION:  PT End of Session - 01/02/23 1015     Visit Number 2    Number of Visits 24    Date for PT Re-Evaluation 03/25/23    Authorization Type medicare A/B; [employer] federal secondary    Progress Note Due on Visit 10    PT Start Time 1015    PT Stop Time 1100    PT Time Calculation (min) 45 min    Activity Tolerance Patient tolerated treatment well    Behavior During Therapy WFL for tasks assessed/performed             Past Medical History:  Diagnosis Date   Allergy    Arthritis    "all over"   Chronic pain syndrome    Chronic UTI    Fibromyalgia    Hx of colonic polyps    Hypertension    Hypothyroidism    Obesity    Pre-diabetes    "barely" (12/22/2013)   Past Surgical History:  Procedure Laterality Date   ABDOMINAL HYSTERECTOMY  1990   APPENDECTOMY  1965   CESAREAN SECTION  1980; 1985   CHOLECYSTECTOMY  1982   DILATION AND CURETTAGE OF UTERUS  1990   FOOT SURGERY  1992   KNEE ARTHROSCOPY Right 1992   TONSILLECTOMY  1959   TOTAL SHOULDER ARTHROPLASTY Left 11/25/2012   Procedure: TOTAL SHOULDER ARTHROPLASTY;  Surgeon: Mable Paris, MD;  Location: [facility] OR;  Service: Orthopedics;  Laterality: Left;  LEFT TOTAL SHOULDER ARTHROPLASTY   TOTAL SHOULDER ARTHROPLASTY Right 12/22/2013   TOTAL SHOULDER ARTHROPLASTY Right 12/22/2013   Procedure: TOTAL SHOULDER ARTHROPLASTY;  Surgeon: Mable Paris, MD;  Location: [facility] OR;  Service: Orthopedics;  Laterality: Right;  Right shoulder total arthroplasty   TUBAL LIGATION  1985   Patient Active Problem List   Diagnosis Date Noted   Visceral obesity 01/13/2022   Other fatigue 01/13/2022   Other specified hypothyroidism 01/13/2022   Primary osteoarthritis involving multiple joints 01/13/2022   Class 1 obesity with serious comorbidity and body mass  index (BMI) of 32.0 to 32.9 in adult 01/13/2022   Arthritis of shoulder region, right, degenerative 12/22/2013   Osteoarthritis of right shoulder 08/09/2013   Right tennis elbow 08/09/2013   Status post total shoulder replacement 11/26/2012   Fibromyalgia 08/15/2010   Chronic pain 08/15/2010   HYPOTHYROIDISM 05/15/2009   HYPERTENSION 05/15/2009   ALLERGIC RHINITIS 05/15/2009   DYSURIA 05/15/2009   History of colonic polyps 05/15/2009    PCP: Dr Dorthey Sawyer   REFERRING PROVIDER: Dr Dorthey Sawyer   REFERRING DIAG: Wedge compression fracture L5  Rationale for Evaluation and Treatment: Rehabilitation  THERAPY DIAG:  Other low back pain  Muscle weakness (generalized)  Other symptoms and signs involving the musculoskeletal system  Other abnormalities of gait and mobility  Osteoarthritis of both knees, unspecified osteoarthritis type  Difficulty in walking, not elsewhere classified  ONSET DATE: 10/02/22  SUBJECTIVE:  SUBJECTIVE STATEMENT: Patient reports she is sore in her shoulders, neck, and abs. Patient states her tailbone pain comes and goes depending on the surface she is sitting on. Patient states she gets dizzy when she lays flat and sometimes when rolling to the R; states in the past she has been treated for posterior BPPV on right side.   PERTINENT HISTORY:  Bilat shoulder replacements; bilat OA knees; obesity  PAIN:  Are you having pain? Yes: NPRS scale: 3/10 Pain location: low back to tailbone  Pain description: aching; sharp at times  Aggravating factors: sitting on hard surface; driving  Relieving factors: rest; sometimes heat or ice   PRECAUTIONS: None  RED FLAGS: None   WEIGHT BEARING RESTRICTIONS: No  FALLS:  Has patient fallen in last 6 months? Yes. 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  DIAGNOSTIC FINDINGS:  None available   PATIENT SURVEYS:  FOTO 49; goal 61  SCREENING FOR RED FLAGS: Bowel or bladder incontinence: No Spinal tumors: No Cauda equina syndrome: No Compression fracture: No Abdominal aneurysm: No  COGNITION: Overall cognitive status: Within functional limits for tasks assessed     SENSATION: WFL  MUSCLE LENGTH: Hamstrings: Right 60  deg; Left 65 deg Tight hip flexors bilat R > L   POSTURE: rounded shoulders, forward head, decreased lumbar lordosis, increased thoracic kyphosis, flexed trunk , and upper body shifted to L; knees flexed    PALPATION: Tenderness and tightness R > l lateral sacral border into coccyx   LUMBAR ROM:   AROM eval  Flexion 90%  Extension 40%  Right lateral flexion 70%  Left lateral flexion 65%  Right rotation 40%  Left rotation 45%    (Blank rows = not tested)  LOWER EXTREMITY ROM:  bilat knee flexion and extension limited R > L    *measure knee flexion and extension at next visit   Active  Right eval Left eval Right 11/1 Left 11/1  Hip flexion   107 110  Hip extension   -7 -3  Hip abduction      Hip adduction      Hip internal rotation      Hip external rotation      Knee flexion      Knee extension      Ankle dorsiflexion      Ankle plantarflexion      Ankle  inversion      Ankle eversion       (Blank rows = not tested)  LOWER EXTREMITY MMT:    MMT Right eval Left eval  Hip flexion 4 5  Hip extension 3 3+  Hip abduction 3 4-  Hip adduction    Hip internal rotation    Hip external rotation    Knee flexion    Knee extension 4 4  Ankle dorsiflexion    Ankle plantarflexion    Ankle inversion    Ankle eversion     (  Blank rows = not tested)  LUMBAR SPECIAL TESTS:  Straight leg raise test: Negative and Slump test: Negative  FUNCTIONAL TESTS:  5 times sit to stand: 11.10 sec  SLS L 5 sec; R 2 sec   Berg balance score 48     GAIT: Distance walked: 40 feet  Assistive device utilized: None Level of assistance: Complete Independence Comments: flexed forward posture   [facility] Adult PT Treatment:                                                DATE: 01/02/2023 Therapeutic Exercise: Knee measurements (see above) Supine: Quad set 10x5" (B) SAQ 10x5" 3#AW (B) Hooklying hip add iso ball squeeze x5 Bridge + hip abd iso GTB x10  Bent knee fall out + GTB x10 (B) Dead bug isometric hold 2x10" Figure 4 LTR x10 (B) Seated knee extension with GTB x10 (B) Standing TKE with GTB 10x5" (B)   [facility] Adult PT Treatment:                                                DATE: 12/31/22 Therapeutic Exercise: Supine Core activation 10 sec x 10  Prone  Glut set 10 sec x 5 Standing  SLS 10-15 sec x 2 R/L  Tandum standing 10 sec x 1 each LE forward  Self Care: Do not sit with legs or ankles crossed     PATIENT EDUCATION:  Education details: POC; HEP  Person educated: Patient Education method: Explanation, Demonstration, Tactile cues, Verbal cues, and Handouts Education comprehension: verbalized understanding, returned demonstration, verbal cues required, tactile cues required, and needs further education  HOME EXERCISE PROGRAM: Access Code: [facility] URL: [email or web address] Date: 01/02/2023 Prepared by: Carlynn Herald  Exercises - Prone Gluteal Sets  - 1 x daily - 7 x weekly - 1 sets - 10 reps - 10 sec  hold - Prone Hip Extension  - 1 x daily - 7 x weekly - 1 sets - 10 reps - 3 sec  hold - Supine Transversus Abdominis Bracing with Pelvic Floor Contraction  - 2 x daily - 7 x weekly - 1 sets - 10 reps - 10sec  hold - Single Leg Stance with Support  - 2 x daily - 7 x weekly - 1 sets - 3-5 reps - 10-20 sec  hold - Tandem Stance with Support  - 2 x daily - 7 x weekly - 1 sets - 3-5 reps - 10-20 sec  hold - Isometric Dead Bug  - 1 x daily - 7 x weekly - 1 sets - 3-5 reps - 10-20 sec hold - Hooklying Single Leg Bent Knee Fallouts with Resistance  - 1 x daily - 7 x weekly - 3 sets - 10 reps - Bridge with Hip Abduction and Resistance  - 1 x daily - 7 x weekly - 3 sets - 10 reps - Supine Lower Trunk Rotation  - 1 x daily - 7 x weekly - 3 sets - 10 reps - Seated Knee Extension with Resistance  - 1 x daily - 7 x weekly - 3 sets - 10 reps - 3-5 sec hold - Standing Terminal Knee Extension with Resistance  - 1 x daily - 7 x  weekly - 2 sets - 10 reps - 5 sec hold  ASSESSMENT:  CLINICAL IMPRESSION: Session focused on knee strengthening with added resistance as tolerated. Cueing flat back with bridges alleviated lumbar discomfort. Greater limitation with knee extension on right vs left noted. Patient challenged with dead bug isometric holds due to weak core. HEP updated with added knee and core strengthening exercises.   EVAL: Patient is a 77 y.o. female who was seen today for physical therapy evaluation and treatment for wedge fracture L5 lumbar spine. She reports resolving pain primarily in the R sacral and coccyx area. She is most concerned with her balance and walking. She does not feel comfortable and secure with walking. Patient has history of bilat knee arthritis and vestibular dysfunction. She presents with poor posture and alignment; limited LE and trunk mobility/ROM; LE weakness; decreased stability with higher  level balance activities; report of some dizziness with rolling; coccyx/sacral pain R>L. Patient will benefit from PT to address problems identified.   OBJECTIVE IMPAIRMENTS: decreased activity tolerance, decreased balance, decreased mobility, decreased ROM, decreased strength, increased fascial restrictions, impaired flexibility, improper body mechanics, postural dysfunction, and pain.   ACTIVITY LIMITATIONS: sitting and driving  PARTICIPATION LIMITATIONS: driving  PERSONAL FACTORS: Behavior pattern, Fitness, Past/current experiences, and Time since onset of injury/illness/exacerbation are also affecting patient's functional outcome.   REHAB POTENTIAL: Good  CLINICAL DECISION MAKING: Stable/uncomplicated  EVALUATION COMPLEXITY: Low   GOALS: Goals reviewed with patient? Yes  SHORT TERM GOALS: Target date: 02/10/2023  Independent initial HEP  Baseline: Goal status: INITIAL  2.  Further assess higher level balance and vestibular disorder  Baseline:  Goal status: INITIAL   LONG TERM GOALS: Target date: 03/24/2023  Improve posture and alignment with patient to demonstrate more upright posture with increased hip and knee extension in standing  Baseline:  Goal status: INITIAL  2.  Increased LE strength to 4+/5 to 5/5  Baseline:  Goal status: INITIAL  3.  Increase knee extension  Baseline:  Goal status: INITIAL  4.  Patient reports no increased pain with sitting and/or driving > 1 hour  Baseline:  Goal status: INITIAL  5.  Independent in HEP, including aquatic program as indicated  Baseline:  Goal status: INITIAL  6.  Improve functional limitation score to 64 Baseline: 49 Goal status: INITIAL  PLAN:  PT FREQUENCY: 2x/week  PT DURATION: 12 weeks  PLANNED INTERVENTIONS: 97164- PT Re-evaluation, 97110-Therapeutic exercises, 97530- Therapeutic activity, 97112- Neuromuscular re-education, 97535- Self Care, 91478- Manual therapy, [id number]- Gait training, [phone]- Aquatic  Therapy, 97014- Electrical stimulation (unattended), [id number]- Ultrasound, 13086- Ionotophoresis 4mg /ml Dexamethasone, Patient/Family education, Balance training, Stair training, Taping, Dry Needling, Joint mobilization, Spinal mobilization, Cryotherapy, and Moist heat.  PLAN FOR NEXT SESSION: Review HEP as needed; progress knee and core strengthening; balance; manual work, DN, modalities as indicated.    *further assess vestibular disorder as MD orders (requested PT eval)   Sanjuana Mae, PTA 01/02/2023, 11:01 AM

## 2023-01-06 ENCOUNTER — Ambulatory Visit: Payer: Medicare Other

## 2023-01-06 DIAGNOSIS — M6281 Muscle weakness (generalized): Secondary | ICD-10-CM

## 2023-01-06 DIAGNOSIS — R2689 Other abnormalities of gait and mobility: Secondary | ICD-10-CM

## 2023-01-06 DIAGNOSIS — R262 Difficulty in walking, not elsewhere classified: Secondary | ICD-10-CM

## 2023-01-06 DIAGNOSIS — M5459 Other low back pain: Secondary | ICD-10-CM | POA: Diagnosis not present

## 2023-01-06 DIAGNOSIS — R29898 Other symptoms and signs involving the musculoskeletal system: Secondary | ICD-10-CM

## 2023-01-06 DIAGNOSIS — M170 Bilateral primary osteoarthritis of knee: Secondary | ICD-10-CM

## 2023-01-06 NOTE — Therapy (Signed)
OUTPATIENT PHYSICAL THERAPY THORACOLUMBAR TREATMENT   Patient Name: SANYE LEDESMA MRN: [id number] DOB:05-18-1945, 77 y.o., female Today's Date: 01/06/2023  END OF SESSION:  PT End of Session - 01/06/23 1107     Visit Number 3    Number of Visits 24    Date for PT Re-Evaluation 03/25/23    Authorization Type medicare A/B; [employer]BS federal secondary    Progress Note Due on Visit 10    PT Start Time 1107    PT Stop Time 1146    PT Time Calculation (min) 39 min    Activity Tolerance Patient tolerated treatment well    Behavior During Therapy WFL for tasks assessed/performed            Past Medical History:  Diagnosis Date   Allergy    Arthritis    "all over"   Chronic pain syndrome    Chronic UTI    Fibromyalgia    Hx of colonic polyps    Hypertension    Hypothyroidism    Obesity    Pre-diabetes    "barely" (12/22/2013)   Past Surgical History:  Procedure Laterality Date   ABDOMINAL HYSTERECTOMY  1990   APPENDECTOMY  1965   CESAREAN SECTION  1980; 1985   CHOLECYSTECTOMY  1982   DILATION AND CURETTAGE OF UTERUS  1990   FOOT SURGERY  1992   KNEE ARTHROSCOPY Right 1992   TONSILLECTOMY  1959   TOTAL SHOULDER ARTHROPLASTY Left 11/25/2012   Procedure: TOTAL SHOULDER ARTHROPLASTY;  Surgeon: Mable Paris, MD;  Location: [facility] OR;  Service: Orthopedics;  Laterality: Left;  LEFT TOTAL SHOULDER ARTHROPLASTY   TOTAL SHOULDER ARTHROPLASTY Right 12/22/2013   TOTAL SHOULDER ARTHROPLASTY Right 12/22/2013   Procedure: TOTAL SHOULDER ARTHROPLASTY;  Surgeon: Mable Paris, MD;  Location: [facility] OR;  Service: Orthopedics;  Laterality: Right;  Right shoulder total arthroplasty   TUBAL LIGATION  1985   Patient Active Problem List   Diagnosis Date Noted   Visceral obesity 01/13/2022   Other fatigue 01/13/2022   Other specified hypothyroidism 01/13/2022   Primary osteoarthritis involving multiple joints 01/13/2022   Class 1 obesity with serious comorbidity and body mass  index (BMI) of 32.0 to 32.9 in adult 01/13/2022   Arthritis of shoulder region, right, degenerative 12/22/2013   Osteoarthritis of right shoulder 08/09/2013   Right tennis elbow 08/09/2013   Status post total shoulder replacement 11/26/2012   Fibromyalgia 08/15/2010   Chronic pain 08/15/2010   HYPOTHYROIDISM 05/15/2009   HYPERTENSION 05/15/2009   ALLERGIC RHINITIS 05/15/2009   DYSURIA 05/15/2009   History of colonic polyps 05/15/2009    PCP: Dr Dorthey Sawyer   REFERRING PROVIDER: Dr Dorthey Sawyer   REFERRING DIAG: Wedge compression fracture L5  Rationale for Evaluation and Treatment: Rehabilitation  THERAPY DIAG:  Other low back pain  Muscle weakness (generalized)  Other symptoms and signs involving the musculoskeletal system  Other abnormalities of gait and mobility  Osteoarthritis of both knees, unspecified osteoarthritis type  Difficulty in walking, not elsewhere classified  ONSET DATE: 10/02/22  SUBJECTIVE:  SUBJECTIVE STATEMENT: Patient reports she is sore from her gym routine workout. Patient states she went walking with knee braces and it felt okay. Patient states her back pain is better, same with bilateral knees.  PERTINENT HISTORY:  Bilat shoulder replacements; bilat OA knees; obesity  PAIN:  Are you having pain? Yes: NPRS scale: 3/10 Pain location: low back to tailbone  Pain description: aching; sharp at times  Aggravating factors: sitting on hard surface; driving  Relieving factors: rest; sometimes heat or ice   PRECAUTIONS: None  RED FLAGS: None   WEIGHT BEARING RESTRICTIONS: No  FALLS:  Has patient fallen in last 6 months? Yes. 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  DIAGNOSTIC FINDINGS:  None available   PATIENT SURVEYS:  FOTO 49; goal 71  SCREENING FOR RED FLAGS: Bowel or bladder incontinence: No Spinal tumors: No Cauda equina syndrome: No Compression fracture: No Abdominal aneurysm: No  COGNITION: Overall cognitive status: Within functional limits for tasks assessed     SENSATION: WFL  MUSCLE LENGTH: Hamstrings: Right 60  deg; Left 65 deg Tight hip flexors bilat R > L   POSTURE: rounded shoulders, forward head, decreased lumbar lordosis, increased thoracic kyphosis, flexed trunk , and upper body shifted to L; knees flexed    PALPATION: Tenderness and tightness R > l lateral sacral border into coccyx   LUMBAR ROM:   AROM eval  Flexion 90%  Extension 40%  Right lateral flexion 70%  Left lateral flexion 65%  Right rotation 40%  Left rotation 45%    (Blank rows = not tested)  LOWER EXTREMITY ROM:  bilat knee flexion and extension limited R > L    *measure knee flexion and extension at next visit   Active  Right eval Left eval Right 11/1 Left 11/1  Hip flexion   107 110  Hip extension   -7 -3  Hip abduction      Hip adduction      Hip internal rotation      Hip external rotation      Knee flexion      Knee extension      Ankle dorsiflexion      Ankle plantarflexion      Ankle inversion      Ankle eversion       (Blank rows = not tested)  LOWER EXTREMITY MMT:    MMT  Right eval Left eval  Hip flexion 4 5  Hip extension 3 3+  Hip abduction 3 4-  Hip adduction    Hip internal rotation    Hip external rotation    Knee flexion    Knee extension 4 4  Ankle dorsiflexion    Ankle plantarflexion    Ankle inversion    Ankle eversion     (Blank rows = not tested)  LUMBAR SPECIAL TESTS:  Straight leg raise test: Negative and Slump test: Negative  FUNCTIONAL  TESTS:  5 times sit to stand: 11.10 sec  SLS L 5 sec; R 2 sec   Berg balance score 48     GAIT: Distance walked: 40 feet  Assistive device utilized: None Level of assistance: Complete Independence Comments: flexed forward posture   [facility] Adult PT Treatment:                                                DATE: 01/06/2023 Therapeutic Exercise: Hooklying ball squeeze 10x5" Bridge + hip abd iso GTB 2x10  Hooklying with ball squeeze + LAQ x10 (B) Figure 4 LTR x10 (B) SKTC --> DKTC Dead bug isometric hold Single leg stretch  Double leg stretch  Scissors Double leg straight leg stretch  Hooklying wiper legs Prone propped on elbows --> alt single leg kicks Prone with hands stacked underneath forehead --> spinal extension: head only, one head on forehead, both hands on forehead Seated knee extension GTB x10 (B) Standing TKE GTB x10 (B)   [facility] Adult PT Treatment:                                                DATE: 01/02/2023 Therapeutic Exercise: Knee measurements (see above) Supine: Quad set 10x5" (B) SAQ 10x5" 3#AW (B) Hooklying hip add iso ball squeeze x5 Bridge + hip abd iso GTB x10  Bent knee fall out + GTB x10 (B) Dead bug isometric hold 2x10" Figure 4 LTR x10 (B) Seated knee extension with GTB x10 (B) Standing TKE with GTB 10x5" (B)   [facility] Adult PT Treatment:                                                DATE: 12/31/22 Therapeutic Exercise: Supine Core activation 10 sec x 10  Prone  Glut set 10 sec x 5 Standing  SLS 10-15 sec x 2 R/L  Tandem standing 10 sec x 1 each  LE forward  Self Care: Do not sit with legs or ankles crossed     PATIENT EDUCATION:  Education details: POC; HEP  Person educated: Patient Education method: Explanation, Demonstration, Tactile cues, Verbal cues, and Handouts Education comprehension: verbalized understanding, returned demonstration, verbal cues required, tactile cues required, and needs further education  HOME EXERCISE PROGRAM: Access Code: [facility] URL: [email or web address] Date: 01/02/2023 Prepared by: Carlynn Herald  Exercises - Prone Gluteal Sets  - 1 x daily - 7 x weekly - 1 sets - 10 reps - 10 sec  hold - Prone Hip Extension  - 1 x daily - 7 x weekly - 1 sets - 10 reps - 3 sec  hold - Supine Transversus Abdominis Bracing with Pelvic Floor Contraction  - 2 x daily - 7 x weekly - 1 sets - 10 reps - 10sec  hold - Single Leg Stance with Support  - 2 x daily - 7 x weekly - 1 sets - 3-5 reps - 10-20 sec  hold - Tandem Stance with Support  - 2 x daily - 7 x weekly - 1 sets - 3-5 reps - 10-20 sec  hold - Isometric Dead Bug  -  1 x daily - 7 x weekly - 1 sets - 3-5 reps - 10-20 sec hold - Hooklying Single Leg Bent Knee Fallouts with Resistance  - 1 x daily - 7 x weekly - 3 sets - 10 reps - Bridge with Hip Abduction and Resistance  - 1 x daily - 7 x weekly - 3 sets - 10 reps - Supine Lower Trunk Rotation  - 1 x daily - 7 x weekly - 3 sets - 10 reps - Seated Knee Extension with Resistance  - 1 x daily - 7 x weekly - 3 sets - 10 reps - 3-5 sec hold - Standing Terminal Knee Extension with Resistance  - 1 x daily - 7 x weekly - 2 sets - 10 reps - 5 sec hold  ASSESSMENT:  CLINICAL IMPRESSION: Session focused on pilates-based exercises to progress core and postural strength/stability. Patient demonstrated good application of verbal and tactile cues for improved TA activation. Discussion with patient on being more consistent with HEP and less on weight machines at gym to progress postural and core  strengthening.  EVAL: Patient is a 77 y.o. female who was seen today for physical therapy evaluation and treatment for wedge fracture L5 lumbar spine. She reports resolving pain primarily in the R sacral and coccyx area. She is most concerned with her balance and walking. She does not feel comfortable and secure with walking. Patient has history of bilat knee arthritis and vestibular dysfunction. She presents with poor posture and alignment; limited LE and trunk mobility/ROM; LE weakness; decreased stability with higher level balance activities; report of some dizziness with rolling; coccyx/sacral pain R>L. Patient will benefit from PT to address problems identified.   OBJECTIVE IMPAIRMENTS: decreased activity tolerance, decreased balance, decreased mobility, decreased ROM, decreased strength, increased fascial restrictions, impaired flexibility, improper body mechanics, postural dysfunction, and pain.   ACTIVITY LIMITATIONS: sitting and driving  PARTICIPATION LIMITATIONS: driving  PERSONAL FACTORS: Behavior pattern, Fitness, Past/current experiences, and Time since onset of injury/illness/exacerbation are also affecting patient's functional outcome.   REHAB POTENTIAL: Good  CLINICAL DECISION MAKING: Stable/uncomplicated  EVALUATION COMPLEXITY: Low   GOALS: Goals reviewed with patient? Yes  SHORT TERM GOALS: Target date: 02/10/2023  Independent initial HEP  Baseline: Goal status: INITIAL  2.  Further assess higher level balance and vestibular disorder  Baseline:  Goal status: INITIAL   LONG TERM GOALS: Target date: 03/24/2023  Improve posture and alignment with patient to demonstrate more upright posture with increased hip and knee extension in standing  Baseline:  Goal status: INITIAL  2.  Increased LE strength to 4+/5 to 5/5  Baseline:  Goal status: INITIAL  3.  Increase knee extension  Baseline:  Goal status: INITIAL  4.  Patient reports no increased pain with sitting  and/or driving > 1 hour  Baseline:  Goal status: INITIAL  5.  Independent in HEP, including aquatic program as indicated  Baseline:  Goal status: INITIAL  6.  Improve functional limitation score to 64 Baseline: 49 Goal status: INITIAL  PLAN:  PT FREQUENCY: 2x/week  PT DURATION: 12 weeks  PLANNED INTERVENTIONS: 97164- PT Re-evaluation, 97110-Therapeutic exercises, 97530- Therapeutic activity, 97112- Neuromuscular re-education, 97535- Self Care, 40347- Manual therapy, [id number]- Gait training, [phone]- Aquatic Therapy, 97014- Electrical stimulation (unattended), [id number]- Ultrasound, 63875- Ionotophoresis 4mg /ml Dexamethasone, Patient/Family education, Balance training, Stair training, Taping, Dry Needling, Joint mobilization, Spinal mobilization, Cryotherapy, and Moist heat.  PLAN FOR NEXT SESSION: Update HEP next visit; progress knee and core strengthening; balance; manual work, DN, modalities  as indicated.    *further assess vestibular disorder as MD orders (requested PT eval)   Sanjuana Mae, PTA 01/06/2023, 11:46 AM

## 2023-01-08 ENCOUNTER — Ambulatory Visit: Payer: Medicare Other

## 2023-01-08 DIAGNOSIS — R29898 Other symptoms and signs involving the musculoskeletal system: Secondary | ICD-10-CM

## 2023-01-08 DIAGNOSIS — M6281 Muscle weakness (generalized): Secondary | ICD-10-CM

## 2023-01-08 DIAGNOSIS — R262 Difficulty in walking, not elsewhere classified: Secondary | ICD-10-CM

## 2023-01-08 DIAGNOSIS — R2689 Other abnormalities of gait and mobility: Secondary | ICD-10-CM

## 2023-01-08 DIAGNOSIS — M5459 Other low back pain: Secondary | ICD-10-CM | POA: Diagnosis not present

## 2023-01-08 DIAGNOSIS — M170 Bilateral primary osteoarthritis of knee: Secondary | ICD-10-CM

## 2023-01-08 NOTE — Therapy (Signed)
OUTPATIENT PHYSICAL THERAPY THORACOLUMBAR TREATMENT   Patient Name: FLORINDA TAFLINGER MRN: [id number] DOB:23-Mar-1945, 77 y.o., female Today's Date: 01/08/2023  END OF SESSION:  PT End of Session - 01/08/23 1150     Visit Number 4    Number of Visits 24    Date for PT Re-Evaluation 03/25/23    Authorization Type medicare A/B; [employer] federal secondary    Progress Note Due on Visit 10    PT Start Time 1150    PT Stop Time 1230    PT Time Calculation (min) 40 min    Activity Tolerance Patient tolerated treatment well    Behavior During Therapy WFL for tasks assessed/performed            Past Medical History:  Diagnosis Date   Allergy    Arthritis    "all over"   Chronic pain syndrome    Chronic UTI    Fibromyalgia    Hx of colonic polyps    Hypertension    Hypothyroidism    Obesity    Pre-diabetes    "barely" (12/22/2013)   Past Surgical History:  Procedure Laterality Date   ABDOMINAL HYSTERECTOMY  1990   APPENDECTOMY  1965   CESAREAN SECTION  1980; 1985   CHOLECYSTECTOMY  1982   DILATION AND CURETTAGE OF UTERUS  1990   FOOT SURGERY  1992   KNEE ARTHROSCOPY Right 1992   TONSILLECTOMY  1959   TOTAL SHOULDER ARTHROPLASTY Left 11/25/2012   Procedure: TOTAL SHOULDER ARTHROPLASTY;  Surgeon: Mable Paris, MD;  Location: [facility] OR;  Service: Orthopedics;  Laterality: Left;  LEFT TOTAL SHOULDER ARTHROPLASTY   TOTAL SHOULDER ARTHROPLASTY Right 12/22/2013   TOTAL SHOULDER ARTHROPLASTY Right 12/22/2013   Procedure: TOTAL SHOULDER ARTHROPLASTY;  Surgeon: Mable Paris, MD;  Location: [facility] OR;  Service: Orthopedics;  Laterality: Right;  Right shoulder total arthroplasty   TUBAL LIGATION  1985   Patient Active Problem List   Diagnosis Date Noted   Visceral obesity 01/13/2022   Other fatigue 01/13/2022   Other specified hypothyroidism 01/13/2022   Primary osteoarthritis involving multiple joints 01/13/2022   Class 1 obesity with serious comorbidity and body mass  index (BMI) of 32.0 to 32.9 in adult 01/13/2022   Arthritis of shoulder region, right, degenerative 12/22/2013   Osteoarthritis of right shoulder 08/09/2013   Right tennis elbow 08/09/2013   Status post total shoulder replacement 11/26/2012   Fibromyalgia 08/15/2010   Chronic pain 08/15/2010   HYPOTHYROIDISM 05/15/2009   HYPERTENSION 05/15/2009   ALLERGIC RHINITIS 05/15/2009   DYSURIA 05/15/2009   History of colonic polyps 05/15/2009    PCP: Dr Dorthey Sawyer   REFERRING PROVIDER: Dr Dorthey Sawyer   REFERRING DIAG: Wedge compression fracture L5  Rationale for Evaluation and Treatment: Rehabilitation  THERAPY DIAG:  Other low back pain  Muscle weakness (generalized)  Other symptoms and signs involving the musculoskeletal system  Other abnormalities of gait and mobility  Osteoarthritis of both knees, unspecified osteoarthritis type  Difficulty in walking, not elsewhere classified  ONSET DATE: 10/02/22  SUBJECTIVE:  SUBJECTIVE STATEMENT: Patient reports she is sore in abdominals after last visit; states she has not been as dizzy, however continues to have brief increase in dizziness when turning head to R in supine.   PERTINENT HISTORY:  Bilat shoulder replacements; bilat OA knees; obesity  PAIN:  Are you having pain? Yes: NPRS scale: 3/10 Pain location: low back to tailbone  Pain description: aching; sharp at times  Aggravating factors: sitting on hard surface; driving  Relieving factors: rest; sometimes heat or ice   PRECAUTIONS: None  RED FLAGS: None   WEIGHT BEARING RESTRICTIONS: No  FALLS:  Has patient fallen in last 6 months? Yes. 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  DIAGNOSTIC FINDINGS:  None available   PATIENT SURVEYS:  FOTO 49; goal 18  SCREENING FOR RED FLAGS: Bowel or bladder incontinence: No Spinal tumors: No Cauda equina syndrome: No Compression fracture: No Abdominal aneurysm: No  COGNITION: Overall cognitive status: Within functional limits for tasks assessed     SENSATION: WFL  MUSCLE LENGTH: Hamstrings: Right 60  deg; Left 65 deg Tight hip flexors bilat R > L   POSTURE: rounded shoulders, forward head, decreased lumbar lordosis, increased thoracic kyphosis, flexed trunk , and upper body shifted to L; knees flexed    PALPATION: Tenderness and tightness R > l lateral sacral border into coccyx   LUMBAR ROM:   AROM eval  Flexion 90%  Extension 40%  Right lateral flexion 70%  Left lateral flexion 65%  Right rotation 40%  Left rotation 45%    (Blank rows = not tested)  LOWER EXTREMITY ROM:  bilat knee flexion and extension limited R > L    *measure knee flexion and extension at next visit   Active  Right eval Left eval Right 11/1 Left 11/1  Hip flexion   107 110  Hip extension   -7 -3  Hip abduction      Hip adduction      Hip internal rotation      Hip external rotation      Knee flexion      Knee extension      Ankle dorsiflexion      Ankle plantarflexion      Ankle inversion      Ankle eversion       (Blank rows = not tested)  LOWER EXTREMITY MMT:    MMT Right eval Left eval  Hip flexion 4  5  Hip extension 3 3+  Hip abduction 3 4-  Hip adduction    Hip internal rotation    Hip external rotation    Knee flexion    Knee extension 4 4  Ankle dorsiflexion    Ankle plantarflexion    Ankle inversion    Ankle eversion     (Blank rows = not tested)  LUMBAR SPECIAL TESTS:  Straight leg raise test: Negative and Slump test: Negative  FUNCTIONAL  TESTS:  5 times sit to stand: 11.10 sec  SLS L 5 sec; R 2 sec   Berg balance score 48     GAIT: Distance walked: 40 feet  Assistive device utilized: None Level of assistance: Complete Independence Comments: flexed forward posture   [facility] Adult PT Treatment:                                                DATE: 01/08/2023 Therapeutic Exercise: LTR --> figure 4 LR Pilates ab series (no bicycle) Bridges + hip abd iso GTB x10 --> added clamshell x10  Standing: Quarter Libbey squat -->added ball b/w knees Hip hinge dead lift with against Bohlin & rolling FR down/up Resisted squat with orange superband x10    [facility] Adult PT Treatment:                                                DATE: 01/06/2023 Therapeutic Exercise: Hooklying ball squeeze 10x5" Bridge + hip abd iso GTB 2x10  Hooklying with ball squeeze + LAQ x10 (B) Figure 4 LTR x10 (B) SKTC --> DKTC Dead bug isometric hold Single leg stretch  Double leg stretch  Scissors Double leg straight leg stretch  Hooklying wiper legs Prone propped on elbows --> alt single leg kicks Prone with hands stacked underneath forehead --> spinal extension: head only, one head on forehead, both hands on forehead Seated knee extension GTB x10 (B) Standing TKE GTB x10 (B)   [facility] Adult PT Treatment:                                                DATE: 01/02/2023 Therapeutic Exercise: Knee measurements (see above) Supine: Quad set 10x5" (B) SAQ 10x5" 3#AW (B) Hooklying hip add iso ball squeeze x5 Bridge + hip abd iso GTB x10  Bent knee fall out + GTB x10 (B) Dead bug isometric hold  2x10" Figure 4 LTR x10 (B) Seated knee extension with GTB x10 (B) Standing TKE with GTB 10x5" (B)   PATIENT EDUCATION:  Education details: POC; HEP  Person educated: Patient Education method: Explanation, Demonstration, Tactile cues, Verbal cues, and Handouts Education comprehension: verbalized understanding, returned demonstration, verbal cues required, tactile cues required, and needs further education  HOME EXERCISE PROGRAM: Access Code: [facility] URL: [email or web address] Date: 01/08/2023 Prepared by: Carlynn Herald  Exercises - Single Leg Stance with Support  - 2 x daily - 7 x weekly - 1 sets - 3-5 reps - 10-20 sec  hold - Tandem Stance with Support  - 2 x daily - 7 x weekly - 1 sets - 3-5 reps - 10-20 sec  hold - Isometric Dead Bug  - 1 x daily - 7 x weekly - 1 sets - 3-5 reps - 10-20 sec hold - Hooklying Single Leg Bent Knee Fallouts with Resistance  - 1 x daily - 7 x weekly - 3 sets - 10 reps - Bridge with Hip Abduction and Resistance  - 1 x daily - 7 x weekly - 3 sets - 10 reps - Supine Lower Trunk Rotation  - 1 x daily -  7 x weekly - 3 sets - 10 reps - Seated Knee Extension with Resistance  - 1 x daily - 7 x weekly - 3 sets - 10 reps - 3-5 sec hold - Standing Terminal Knee Extension with Resistance  - 1 x daily - 7 x weekly - 2 sets - 10 reps - 5 sec hold - Single Leg Stretch  - 1 x daily - 7 x weekly - 3 sets - 10 reps - Double Leg Stretch Diagonal  - 1 x daily - 7 x weekly - 3 sets - 10 reps - Supine Leg Scissors  - 1 x daily - 7 x weekly - 3 sets - 10 reps - Bridge with Hip Abduction and Resistance  - 1 x daily - 7 x weekly - 3 sets - 10 reps - Masoner Quarter Squat  - 1 x daily - 7 x weekly - 3 sets - 10 reps - Deadlift with Resistance  - 1 x daily - 7 x weekly - 3 sets - 10 reps  ASSESSMENT:  CLINICAL IMPRESSION: Standing exercises progressed with introduction of squat variations, focusing on hip hinge mechanics. Resistance added to standing squat to  challenge eccentric/concentric control.  EVAL: Patient is a 77 y.o. female who was seen today for physical therapy evaluation and treatment for wedge fracture L5 lumbar spine. She reports resolving pain primarily in the R sacral and coccyx area. She is most concerned with her balance and walking. She does not feel comfortable and secure with walking. Patient has history of bilat knee arthritis and vestibular dysfunction. She presents with poor posture and alignment; limited LE and trunk mobility/ROM; LE weakness; decreased stability with higher level balance activities; report of some dizziness with rolling; coccyx/sacral pain R>L. Patient will benefit from PT to address problems identified.   OBJECTIVE IMPAIRMENTS: decreased activity tolerance, decreased balance, decreased mobility, decreased ROM, decreased strength, increased fascial restrictions, impaired flexibility, improper body mechanics, postural dysfunction, and pain.   ACTIVITY LIMITATIONS: sitting and driving  PARTICIPATION LIMITATIONS: driving  PERSONAL FACTORS: Behavior pattern, Fitness, Past/current experiences, and Time since onset of injury/illness/exacerbation are also affecting patient's functional outcome.   REHAB POTENTIAL: Good  CLINICAL DECISION MAKING: Stable/uncomplicated  EVALUATION COMPLEXITY: Low   GOALS: Goals reviewed with patient? Yes  SHORT TERM GOALS: Target date: 02/10/2023  Independent initial HEP  Baseline: Goal status: INITIAL  2.  Further assess higher level balance and vestibular disorder  Baseline:  Goal status: INITIAL   LONG TERM GOALS: Target date: 03/24/2023  Improve posture and alignment with patient to demonstrate more upright posture with increased hip and knee extension in standing  Baseline:  Goal status: INITIAL  2.  Increased LE strength to 4+/5 to 5/5  Baseline:  Goal status: INITIAL  3.  Increase knee extension  Baseline:  Goal status: INITIAL  4.  Patient reports no  increased pain with sitting and/or driving > 1 hour  Baseline:  Goal status: INITIAL  5.  Independent in HEP, including aquatic program as indicated  Baseline:  Goal status: INITIAL  6.  Improve functional limitation score to 64 Baseline: 49 Goal status: INITIAL  PLAN:  PT FREQUENCY: 2x/week  PT DURATION: 12 weeks  PLANNED INTERVENTIONS: 97164- PT Re-evaluation, 97110-Therapeutic exercises, 97530- Therapeutic activity, 97112- Neuromuscular re-education, 97535- Self Care, 16109- Manual therapy, [id number]- Gait training, [phone]- Aquatic Therapy, 97014- Electrical stimulation (unattended), [id number]- Ultrasound, 09811- Ionotophoresis 4mg /ml Dexamethasone, Patient/Family education, Balance training, Stair training, Taping, Dry Needling, Joint mobilization, Spinal mobilization, Cryotherapy, and Moist  heat.  PLAN FOR NEXT SESSION: Further assess vestibular disorder next visit. Progress knee and core strengthening; balance; manual work, DN, modalities as indicated.     Sanjuana Mae, PTA 01/08/2023, 12:37 PM

## 2023-01-13 ENCOUNTER — Encounter: Payer: Self-pay | Admitting: Physical Therapy

## 2023-01-13 ENCOUNTER — Ambulatory Visit: Payer: Medicare Other | Admitting: Physical Therapy

## 2023-01-13 DIAGNOSIS — R29898 Other symptoms and signs involving the musculoskeletal system: Secondary | ICD-10-CM

## 2023-01-13 DIAGNOSIS — M6281 Muscle weakness (generalized): Secondary | ICD-10-CM

## 2023-01-13 DIAGNOSIS — M5459 Other low back pain: Secondary | ICD-10-CM | POA: Diagnosis not present

## 2023-01-13 NOTE — Therapy (Signed)
OUTPATIENT PHYSICAL THERAPY THORACOLUMBAR TREATMENT   Patient Name: Sally [name] MRN: [id number] DOB:1946/01/26, 77 y.o., female Today's Date: 01/13/2023  END OF SESSION:  PT End of Session - 01/13/23 1214     Visit Number 5    Number of Visits 24    Date for PT Re-Evaluation 03/25/23    Authorization Type medicare A/B; [employer] federal secondary    Authorization - Visit Number 5    Progress Note Due on Visit 10    PT Start Time 1149    PT Stop Time 1215    PT Time Calculation (min) 26 min    Activity Tolerance Patient tolerated treatment well    Behavior During Therapy WFL for tasks assessed/performed             Past Medical History:  Diagnosis Date   Allergy    Arthritis    "all over"   Chronic pain syndrome    Chronic UTI    Fibromyalgia    Hx of colonic polyps    Hypertension    Hypothyroidism    Obesity    Pre-diabetes    "barely" (12/22/2013)   Past Surgical History:  Procedure Laterality Date   ABDOMINAL HYSTERECTOMY  1990   APPENDECTOMY  1965   CESAREAN SECTION  1980; 1985   CHOLECYSTECTOMY  1982   DILATION AND CURETTAGE OF UTERUS  1990   FOOT SURGERY  1992   KNEE ARTHROSCOPY Right 1992   TONSILLECTOMY  1959   TOTAL SHOULDER ARTHROPLASTY Left 11/25/2012   Procedure: TOTAL SHOULDER ARTHROPLASTY;  Surgeon: Mable Paris, MD;  Location: [facility] OR;  Service: Orthopedics;  Laterality: Left;  LEFT TOTAL SHOULDER ARTHROPLASTY   TOTAL SHOULDER ARTHROPLASTY Right 12/22/2013   TOTAL SHOULDER ARTHROPLASTY Right 12/22/2013   Procedure: TOTAL SHOULDER ARTHROPLASTY;  Surgeon: Mable Paris, MD;  Location: [facility] OR;  Service: Orthopedics;  Laterality: Right;  Right shoulder total arthroplasty   TUBAL LIGATION  1985   Patient Active Problem List   Diagnosis Date Noted   Visceral obesity 01/13/2022   Other fatigue 01/13/2022   Other specified hypothyroidism 01/13/2022   Primary osteoarthritis involving multiple joints 01/13/2022   Class 1 obesity  with serious comorbidity and body mass index (BMI) of 32.0 to 32.9 in adult 01/13/2022   Arthritis of shoulder region, right, degenerative 12/22/2013   Osteoarthritis of right shoulder 08/09/2013   Right tennis elbow 08/09/2013   Status post total shoulder replacement 11/26/2012   Fibromyalgia 08/15/2010   Chronic pain 08/15/2010   HYPOTHYROIDISM 05/15/2009   HYPERTENSION 05/15/2009   ALLERGIC RHINITIS 05/15/2009   DYSURIA 05/15/2009   History of colonic polyps 05/15/2009    PCP: Dr Dorthey Sawyer   REFERRING PROVIDER: Dr Dorthey Sawyer   REFERRING DIAG: Wedge compression fracture L5  Rationale for Evaluation and Treatment: Rehabilitation  THERAPY DIAG:  Muscle weakness (generalized)  Other symptoms and signs involving the musculoskeletal system  ONSET DATE: 10/02/22  SUBJECTIVE:  SUBJECTIVE STATEMENT: Patient reports her back was sore yesterday after having her hair done, she feels better today. She is here today to address vertigo. She states she gets dizzy when rolling right in bed, she also has history of vertigo after flying.  PERTINENT HISTORY:  Bilat shoulder replacements; bilat OA knees; obesity  PAIN:  Are you having pain? Yes: NPRS scale: 3/10 Pain location: low back to tailbone  Pain description: aching; sharp at times  Aggravating factors: sitting on hard surface; driving  Relieving factors: rest; sometimes heat or ice   PRECAUTIONS: None  RED FLAGS: None   WEIGHT BEARING RESTRICTIONS: No  FALLS:  Has patient fallen in last 6 months? Yes. 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  DIAGNOSTIC FINDINGS:  None available   PATIENT SURVEYS:  FOTO 49; goal 69  SCREENING FOR RED FLAGS: Bowel or bladder incontinence: No Spinal tumors: No Cauda equina syndrome: No Compression fracture: No Abdominal aneurysm: No  COGNITION: Overall cognitive status: Within functional limits for tasks assessed     SENSATION: WFL  MUSCLE LENGTH: Hamstrings: Right 60  deg; Left 65 deg Tight hip flexors bilat R > L   POSTURE: rounded shoulders, forward head, decreased lumbar lordosis, increased thoracic kyphosis, flexed trunk , and upper body shifted to L; knees flexed    PALPATION: Tenderness and tightness R > l lateral sacral border into coccyx   LUMBAR ROM:   AROM eval  Flexion 90%  Extension 40%  Right lateral flexion 70%  Left lateral flexion 65%  Right rotation 40%  Left rotation 45%    (Blank rows = not tested)  LOWER EXTREMITY ROM:  bilat knee flexion and extension limited R > L    *measure knee flexion and extension at next visit   Active  Right eval Left eval Right 11/1 Left 11/1  Hip flexion   107 110  Hip extension   -7 -3  Hip abduction      Hip adduction      Hip internal rotation      Hip external rotation      Knee flexion      Knee extension      Ankle dorsiflexion      Ankle plantarflexion      Ankle inversion      Ankle eversion       (Blank rows = not tested)  LOWER EXTREMITY MMT:    MMT Right eval Left eval  Hip flexion 4 5  Hip extension 3 3+  Hip abduction 3 4-  Hip adduction    Hip internal rotation     Hip external rotation    Knee flexion    Knee extension 4 4  Ankle dorsiflexion    Ankle plantarflexion    Ankle inversion    Ankle eversion     (Blank rows = not tested)  LUMBAR SPECIAL TESTS:  Straight leg raise  test: Negative and Slump test: Negative  FUNCTIONAL TESTS:  5 times sit to stand: 11.10 sec  SLS L 5 sec; R 2 sec   Berg balance score 48     GAIT: Distance walked: 40 feet  Assistive device utilized: None Level of assistance: Complete Independence Comments: flexed forward posture   [facility] Adult PT Treatment:                                                DATE: 01/13/23  Neuromuscular re-ed: Charlesetta Ivory Rt - positive, Lt - negative Epley maneuver x 2 for Rt BPPV Semont maneuver   [facility] Adult PT Treatment:                                                DATE: 01/08/2023 Therapeutic Exercise: LTR --> figure 4 LR Pilates ab series (no bicycle) Bridges + hip abd iso GTB x10 --> added clamshell x10  Standing: Quarter Fiorini squat -->added ball b/w knees Hip hinge dead lift with against [place] & rolling FR down/up Resisted squat with orange superband x10    [facility] Adult PT Treatment:                                                DATE: 01/06/2023 Therapeutic Exercise: Hooklying ball squeeze 10x5" Bridge + hip abd iso GTB 2x10  Hooklying with ball squeeze + LAQ x10 (B) Figure 4 LTR x10 (B) SKTC --> DKTC Dead bug isometric hold Single leg stretch  Double leg stretch  Scissors Double leg straight leg stretch  Hooklying wiper legs Prone propped on elbows --> alt single leg kicks Prone with hands stacked underneath forehead --> spinal extension: head only, one head on forehead, both hands on forehead Seated knee extension GTB x10 (B) Standing TKE GTB x10 (B)   [facility] Adult PT Treatment:                                                DATE: 01/02/2023 Therapeutic Exercise: Knee measurements (see above) Supine: Quad set 10x5" (B) SAQ 10x5" 3#AW (B) Hooklying hip  add iso ball squeeze x5 Bridge + hip abd iso GTB x10  Bent knee fall out + GTB x10 (B) Dead bug isometric hold 2x10" Figure 4 LTR x10 (B) Seated knee extension with GTB x10 (B) Standing TKE with GTB 10x5" (B)   PATIENT EDUCATION:  Education details: POC; HEP  Person educated: Patient Education method: Explanation, Demonstration, Tactile cues, Verbal cues, and Handouts Education comprehension: verbalized understanding, returned demonstration, verbal cues required, tactile cues required, and needs further education  HOME EXERCISE PROGRAM:  Access Code: [facility] URL: [email or web address] Date: 01/08/2023 Prepared by: Carlynn Herald  Exercises - Single Leg Stance with Support  - 2 x daily - 7 x weekly - 1 sets - 3-5 reps - 10-20 sec  hold - Tandem Stance with Support  - 2 x daily - 7 x weekly - 1 sets - 3-5 reps -  10-20 sec  hold - Isometric Dead Bug  - 1 x daily - 7 x weekly - 1 sets - 3-5 reps - 10-20 sec hold - Hooklying Single Leg Bent Knee Fallouts with Resistance  - 1 x daily - 7 x weekly - 3 sets - 10 reps - Bridge with Hip Abduction and Resistance  - 1 x daily - 7 x weekly - 3 sets - 10 reps - Supine Lower Trunk Rotation  - 1 x daily - 7 x weekly - 3 sets - 10 reps - Seated Knee Extension with Resistance  - 1 x daily - 7 x weekly - 3 sets - 10 reps - 3-5 sec hold - Standing Terminal Knee Extension with Resistance  - 1 x daily - 7 x weekly - 2 sets - 10 reps - 5 sec hold - Single Leg Stretch  - 1 x daily - 7 x weekly - 3 sets - 10 reps - Double Leg Stretch Diagonal  - 1 x daily - 7 x weekly - 3 sets - 10 reps - Supine Leg Scissors  - 1 x daily - 7 x weekly - 3 sets - 10 reps - Bridge with Hip Abduction and Resistance  - 1 x daily - 7 x weekly - 3 sets - 10 reps - Frees Quarter Squat  - 1 x daily - 7 x weekly - 3 sets - 10 reps - Deadlift with Resistance  - 1 x daily - 7 x weekly - 3 sets - 10 reps  ASSESSMENT:  CLINICAL IMPRESSION: Session today focused on  addressing pt c/o dizziness. Pt positive for Rt BPPV. Pt with good response to Epley maneuver with 90% decrease in dizziness after 2 maneuvers. Pt give Semont maneuver for HEP as she states she has difficulty performing Epley at home. Pt very pleased with results of session and states she feels like her dizziness is "all gone".  EVAL: Patient is a 77 y.o. female who was seen today for physical therapy evaluation and treatment for wedge fracture L5 lumbar spine. She reports resolving pain primarily in the R sacral and coccyx area. She is most concerned with her balance and walking. She does not feel comfortable and secure with walking. Patient has history of bilat knee arthritis and vestibular dysfunction. She presents with poor posture and alignment; limited LE and trunk mobility/ROM; LE weakness; decreased stability with higher level balance activities; report of some dizziness with rolling; coccyx/sacral pain R>L. Patient will benefit from PT to address problems identified.    GOALS: Goals reviewed with patient? Yes  SHORT TERM GOALS: Target date: 02/10/2023  Independent initial HEP  Baseline: Goal status: INITIAL  2.  Further assess higher level balance and vestibular disorder  Baseline:  Goal status: INITIAL   LONG TERM GOALS: Target date: 03/24/2023  Improve posture and alignment with patient to demonstrate more upright posture with increased hip and knee extension in standing  Baseline:  Goal status: INITIAL  2.  Increased LE strength to 4+/5 to 5/5  Baseline:  Goal status: INITIAL  3.  Increase knee extension  Baseline:  Goal status: INITIAL  4.  Patient reports no increased pain with sitting and/or driving > 1 hour  Baseline:  Goal status: INITIAL  5.  Independent in HEP, including aquatic program as indicated  Baseline:  Goal status: INITIAL  6.  Improve functional limitation score to 64 Baseline: 49 Goal status: INITIAL  7.  Pt will perform ADLs, IADLs and  therapeutic exercise  without recurrence of BPPV Baseline:  Goal status: INITIAL   PLAN:  PT FREQUENCY: 2x/week  PT DURATION: 12 weeks  PLANNED INTERVENTIONS: 97164- PT Re-evaluation, 97110-Therapeutic exercises, 97530- Therapeutic activity, [id number]- Neuromuscular re-education, 97535- Self Care, 16109- Manual therapy, [id number]- Gait training, [id number]- Aquatic Therapy, 97014- Electrical stimulation (unattended), 97035- Ultrasound, 09811- Ionotophoresis 4mg /ml Dexamethasone, Patient/Family education, Balance training, Stair training, Taping, Dry Needling, Joint mobilization, Spinal mobilization, Cryotherapy, and Moist heat.  PLAN FOR NEXT SESSION:  Follow up on response to vestibular treatment. Progress knee and core strengthening; balance; manual work, DN, modalities as indicated.     Jerson Furukawa, PT 01/13/2023, 12:15 PM

## 2023-01-14 ENCOUNTER — Ambulatory Visit: Payer: Medicare Other

## 2023-01-14 DIAGNOSIS — Z780 Asymptomatic menopausal state: Secondary | ICD-10-CM

## 2023-01-14 DIAGNOSIS — M810 Age-related osteoporosis without current pathological fracture: Secondary | ICD-10-CM

## 2023-01-14 DIAGNOSIS — S32050D Wedge compression fracture of fifth lumbar vertebra, subsequent encounter for fracture with routine healing: Secondary | ICD-10-CM

## 2023-01-15 ENCOUNTER — Ambulatory Visit: Payer: Medicare Other

## 2023-01-15 DIAGNOSIS — R262 Difficulty in walking, not elsewhere classified: Secondary | ICD-10-CM

## 2023-01-15 DIAGNOSIS — R29898 Other symptoms and signs involving the musculoskeletal system: Secondary | ICD-10-CM

## 2023-01-15 DIAGNOSIS — M5459 Other low back pain: Secondary | ICD-10-CM

## 2023-01-15 DIAGNOSIS — R2689 Other abnormalities of gait and mobility: Secondary | ICD-10-CM

## 2023-01-15 DIAGNOSIS — M170 Bilateral primary osteoarthritis of knee: Secondary | ICD-10-CM

## 2023-01-15 DIAGNOSIS — M6281 Muscle weakness (generalized): Secondary | ICD-10-CM

## 2023-01-15 NOTE — Therapy (Signed)
OUTPATIENT PHYSICAL THERAPY THORACOLUMBAR TREATMENT   Patient Name: Sally [name] MRN: [id number] DOB:Sep 21, 1945, 77 y.o., female Today's Date: 01/15/2023  END OF SESSION:  PT End of Session - 01/15/23 1149     Visit Number 6    Number of Visits 24    Date for PT Re-Evaluation 03/25/23    Authorization Type medicare A/B; [employer] federal secondary    Authorization - Visit Number 6    Progress Note Due on Visit 10    PT Start Time 1148    PT Stop Time 1232    PT Time Calculation (min) 44 min    Activity Tolerance Patient tolerated treatment well    Behavior During Therapy WFL for tasks assessed/performed             Past Medical History:  Diagnosis Date   Allergy    Arthritis    "all over"   Chronic pain syndrome    Chronic UTI    Fibromyalgia    Hx of colonic polyps    Hypertension    Hypothyroidism    Obesity    Pre-diabetes    "barely" (12/22/2013)   Past Surgical History:  Procedure Laterality Date   ABDOMINAL HYSTERECTOMY  1990   APPENDECTOMY  1965   CESAREAN SECTION  1980; 1985   CHOLECYSTECTOMY  1982   DILATION AND CURETTAGE OF UTERUS  1990   FOOT SURGERY  1992   KNEE ARTHROSCOPY Right 1992   TONSILLECTOMY  1959   TOTAL SHOULDER ARTHROPLASTY Left 11/25/2012   Procedure: TOTAL SHOULDER ARTHROPLASTY;  Surgeon: Mable Paris, MD;  Location: [facility] OR;  Service: Orthopedics;  Laterality: Left;  LEFT TOTAL SHOULDER ARTHROPLASTY   TOTAL SHOULDER ARTHROPLASTY Right 12/22/2013   TOTAL SHOULDER ARTHROPLASTY Right 12/22/2013   Procedure: TOTAL SHOULDER ARTHROPLASTY;  Surgeon: Mable Paris, MD;  Location: [facility] OR;  Service: Orthopedics;  Laterality: Right;  Right shoulder total arthroplasty   TUBAL LIGATION  1985   Patient Active Problem List   Diagnosis Date Noted   Visceral obesity 01/13/2022   Other fatigue 01/13/2022   Other specified hypothyroidism 01/13/2022   Primary osteoarthritis involving multiple joints 01/13/2022   Class 1 obesity  with serious comorbidity and body mass index (BMI) of 32.0 to 32.9 in adult 01/13/2022   Arthritis of shoulder region, right, degenerative 12/22/2013   Osteoarthritis of right shoulder 08/09/2013   Right tennis elbow 08/09/2013   Status post total shoulder replacement 11/26/2012   Fibromyalgia 08/15/2010   Chronic pain 08/15/2010   HYPOTHYROIDISM 05/15/2009   HYPERTENSION 05/15/2009   ALLERGIC RHINITIS 05/15/2009   DYSURIA 05/15/2009   History of colonic polyps 05/15/2009    PCP: Dr Dorthey Sawyer   REFERRING PROVIDER: Dr Dorthey Sawyer   REFERRING DIAG: Wedge compression fracture L5  Rationale for Evaluation and Treatment: Rehabilitation  THERAPY DIAG:  Muscle weakness (generalized)  Other symptoms and signs involving the musculoskeletal system  Other low back pain  Other abnormalities of gait and mobility  Osteoarthritis of both knees, unspecified osteoarthritis type  Difficulty in walking, not elsewhere classified  ONSET DATE: 10/02/22  SUBJECTIVE:  SUBJECTIVE STATEMENT: Patient reports she is feeling much better and has had no vertigo symptoms since last session; states she has been able to roll over bed and sit up without getting dizzy. Patient states she caught herself from tripping over her dog yesterday; states overall she is feeling stronger. Patient states her coccyx/sacral pain is mostly gone, states it flares up if she is sitting on a very firm surface but resolves quickly once she stands up.   PERTINENT HISTORY:  Bilat shoulder replacements; bilat OA knees; obesity  PAIN:  Are you having pain? Yes: NPRS scale: 3/10 Pain location: low back to tailbone  Pain description: aching; sharp at times  Aggravating factors: sitting on hard surface; driving  Relieving factors:  rest; sometimes heat or ice   PRECAUTIONS: None  RED FLAGS: None   WEIGHT BEARING RESTRICTIONS: No  FALLS:  Has patient fallen in last 6 months? Yes. 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  DIAGNOSTIC FINDINGS:  None available   PATIENT SURVEYS:  FOTO 49; goal 53  SCREENING FOR RED FLAGS: Bowel or bladder incontinence: No Spinal tumors: No Cauda equina syndrome: No Compression fracture: No Abdominal aneurysm: No  COGNITION: Overall cognitive status: Within functional limits for tasks assessed     SENSATION: WFL  MUSCLE LENGTH: Hamstrings: Right 60  deg; Left 65 deg Tight hip flexors bilat R > L   POSTURE: rounded shoulders, forward head, decreased lumbar lordosis, increased thoracic kyphosis, flexed trunk , and upper body shifted to L; knees flexed    PALPATION: Tenderness and tightness R > l lateral sacral border into coccyx   LUMBAR ROM:   AROM eval  Flexion 90%  Extension 40%  Right lateral flexion 70%  Left lateral flexion 65%  Right rotation 40%  Left rotation 45%    (Blank rows = not tested)  LOWER EXTREMITY ROM:  bilat knee flexion and extension limited R > L    *measure knee flexion and extension at next visit   Active  Right eval Left eval Right 11/1 Left 11/1  Hip flexion   107 110  Hip extension   -7 -3  Hip abduction      Hip adduction       Hip internal rotation      Hip external rotation      Knee flexion      Knee extension      Ankle dorsiflexion      Ankle plantarflexion      Ankle inversion      Ankle eversion       (Blank rows = not tested)  LOWER EXTREMITY MMT:    MMT Right eval Left eval  Hip flexion 4 5  Hip extension 3 3+  Hip abduction 3 4-  Hip adduction    Hip internal rotation    Hip external rotation    Knee flexion    Knee extension 4  4  Ankle dorsiflexion    Ankle plantarflexion    Ankle inversion    Ankle eversion     (Blank rows = not tested)  LUMBAR SPECIAL TESTS:  Straight leg raise test: Negative and Slump test: Negative  FUNCTIONAL TESTS:  5 times sit to stand: 11.10 sec  SLS L 5 sec; R 2 sec   Berg balance score 48     GAIT: Distance walked: 40 feet  Assistive device utilized: None Level of assistance: Complete Independence Comments: flexed forward posture    [facility] Adult PT Treatment:                                                DATE: 01/15/2023 Therapeutic Exercise: LTR --> figure 4 LR Bridges + hip ABD iso blue TB & ankle ADD yoga block x10 Dead bug x10 Pilates ab series (no bicycle) Bent over on counter, propped on elbows: Hip extension on diagonal x10 HS curls in extension x10 Standing TKE GTB x10 (B) Quarter Hagerty squat + ball b/w knees x10 --> blue TB around knees x10 Prone quad stretch 3x30" (B) DKTC --> SKTC   [facility] Adult PT Treatment:                                                DATE: 01/13/23 Neuromuscular re-ed: Piedad Climes Halpike Rt - positive, Lt - negative Epley maneuver x 2 for Rt BPPV Semont maneuver   [facility] Adult PT Treatment:                                                DATE: 01/08/2023 Therapeutic Exercise: LTR --> figure 4 LR Pilates ab series (no bicycle) Bridges + hip abd iso GTB x10 --> added clamshell x10  Standing: Quarter Fanning squat -->added ball b/w knees Hip hinge dead lift with against [place] & rolling FR down/up Resisted squat  with orange superband x10   PATIENT EDUCATION:  Education details: POC; HEP  Person educated: Patient Education method: Programmer, multimedia, Demonstration, Actor cues, Verbal cues, and Handouts Education comprehension: verbalized understanding, returned demonstration, verbal cues required, tactile cues required, and needs further education  HOME EXERCISE PROGRAM:  Access Code: [facility] URL: [email or web address] Date: 01/08/2023 Prepared by: Carlynn Herald  Exercises - Single Leg Stance with Support  - 2 x daily - 7 x weekly - 1 sets - 3-5 reps - 10-20 sec  hold - Tandem Stance with Support  - 2 x daily - 7 x weekly - 1 sets - 3-5 reps - 10-20 sec  hold - Isometric Dead Bug  - 1 x daily - 7 x weekly - 1 sets - 3-5 reps - 10-20 sec hold - Hooklying Single Leg Bent Knee Fallouts with Resistance  - 1 x daily - 7 x weekly - 3 sets - 10 reps - Bridge with Hip Abduction and Resistance  - 1 x daily - 7 x weekly - 3 sets - 10 reps - Supine Lower Trunk Rotation  - 1 x daily - 7 x weekly - 3 sets - 10 reps - Seated Knee Extension with Resistance  -  1 x daily - 7 x weekly - 3 sets - 10 reps - 3-5 sec hold - Standing Terminal Knee Extension with Resistance  - 1 x daily - 7 x weekly - 2 sets - 10 reps - 5 sec hold - Single Leg Stretch  - 1 x daily - 7 x weekly - 3 sets - 10 reps - Double Leg Stretch Diagonal  - 1 x daily - 7 x weekly - 3 sets - 10 reps - Supine Leg Scissors  - 1 x daily - 7 x weekly - 3 sets - 10 reps - Bridge with Hip Abduction and Resistance  - 1 x daily - 7 x weekly - 3 sets - 10 reps - Temples Quarter Squat  - 1 x daily - 7 x weekly - 3 sets - 10 reps - Deadlift with Resistance  - 1 x daily - 7 x weekly - 3 sets - 10 reps  ASSESSMENT:  CLINICAL IMPRESSION: Glute strengthening progressed with hip extension variations in standing. Initial R knee discomfort during Graef squats; incorporating hip add/abd isometric activation decreased symptoms. Patient continues to  demonstrate good progression with strengthening exercises.   VEST: Session today focused on addressing pt c/o dizziness. Pt positive for Rt BPPV. Pt with good response to Epley maneuver with 90% decrease in dizziness after 2 maneuvers. Pt give Semont maneuver for HEP as she states she has difficulty performing Epley at home. Pt very pleased with results of session and states she feels like her dizziness is "all gone".  EVAL: Patient is a 77 y.o. female who was seen today for physical therapy evaluation and treatment for wedge fracture L5 lumbar spine. She reports resolving pain primarily in the R sacral and coccyx area. She is most concerned with her balance and walking. She does not feel comfortable and secure with walking. Patient has history of bilat knee arthritis and vestibular dysfunction. She presents with poor posture and alignment; limited LE and trunk mobility/ROM; LE weakness; decreased stability with higher level balance activities; report of some dizziness with rolling; coccyx/sacral pain R>L. Patient will benefit from PT to address problems identified.    GOALS: Goals reviewed with patient? Yes  SHORT TERM GOALS: Target date: 02/10/2023  Independent initial HEP  Baseline: Goal status: INITIAL  2.  Further assess higher level balance and vestibular disorder  Baseline:  Goal status: INITIAL   LONG TERM GOALS: Target date: 03/24/2023  Improve posture and alignment with patient to demonstrate more upright posture with increased hip and knee extension in standing  Baseline:  Goal status: INITIAL  2.  Increased LE strength to 4+/5 to 5/5  Baseline:  Goal status: INITIAL  3.  Increase knee extension  Baseline:  Goal status: INITIAL  4.  Patient reports no increased pain with sitting and/or driving > 1 hour  Baseline:  Goal status: INITIAL  5.  Independent in HEP, including aquatic program as indicated  Baseline:  Goal status: INITIAL  6.  Improve functional limitation  score to 64 Baseline: 49 Goal status: INITIAL  7.  Pt will perform ADLs, IADLs and therapeutic exercise without recurrence of BPPV Baseline:  Goal status: INITIAL   PLAN:  PT FREQUENCY: 2x/week  PT DURATION: 12 weeks  PLANNED INTERVENTIONS: 97164- PT Re-evaluation, 97110-Therapeutic exercises, 97530- Therapeutic activity, [id number]- Neuromuscular re-education, 97535- Self Care, [id number]- Manual therapy, [id number]- Gait training, [phone]- Aquatic Therapy, 97014- Electrical stimulation (unattended), [id number]- Ultrasound, 82956- Ionotophoresis 4mg /ml Dexamethasone, Patient/Family education, Balance training, Stair training, Taping, Dry Needling,  Joint mobilization, Spinal mobilization, Cryotherapy, and Moist heat.  PLAN FOR NEXT SESSION: Progress knee and core strengthening; balance; manual work, DN, modalities as indicated.     Sanjuana Mae, PTA 01/15/2023, 12:35 PM

## 2023-01-20 ENCOUNTER — Ambulatory Visit: Payer: Medicare Other | Admitting: Rehabilitative and Restorative Service Providers"

## 2023-01-20 ENCOUNTER — Encounter: Payer: Self-pay | Admitting: Rehabilitative and Restorative Service Providers"

## 2023-01-20 DIAGNOSIS — M5459 Other low back pain: Secondary | ICD-10-CM

## 2023-01-20 DIAGNOSIS — R29898 Other symptoms and signs involving the musculoskeletal system: Secondary | ICD-10-CM

## 2023-01-20 DIAGNOSIS — R2689 Other abnormalities of gait and mobility: Secondary | ICD-10-CM

## 2023-01-20 DIAGNOSIS — M170 Bilateral primary osteoarthritis of knee: Secondary | ICD-10-CM

## 2023-01-20 DIAGNOSIS — R262 Difficulty in walking, not elsewhere classified: Secondary | ICD-10-CM

## 2023-01-20 DIAGNOSIS — M6281 Muscle weakness (generalized): Secondary | ICD-10-CM

## 2023-01-20 NOTE — Therapy (Signed)
OUTPATIENT PHYSICAL THERAPY THORACOLUMBAR TREATMENT   Patient Name: Sally [name] MRN: [id number] DOB:10/16/1945, 77 y.o., female Today's Date: 01/20/2023  END OF SESSION:  PT End of Session - 01/20/23 1359     Visit Number 7    Number of Visits 24    Date for PT Re-Evaluation 03/25/23    Authorization Type medicare A/B; [employer] federal secondary    Authorization - Visit Number 7    Progress Note Due on Visit 10    PT Start Time 1358    PT Stop Time 1444    PT Time Calculation (min) 46 min    Activity Tolerance Patient tolerated treatment well             Past Medical History:  Diagnosis Date   Allergy    Arthritis    "all over"   Chronic pain syndrome    Chronic UTI    Fibromyalgia    Hx of colonic polyps    Hypertension    Hypothyroidism    Obesity    Pre-diabetes    "barely" (12/22/2013)   Past Surgical History:  Procedure Laterality Date   ABDOMINAL HYSTERECTOMY  1990   APPENDECTOMY  1965   CESAREAN SECTION  1980; 1985   CHOLECYSTECTOMY  1982   DILATION AND CURETTAGE OF UTERUS  1990   FOOT SURGERY  1992   KNEE ARTHROSCOPY Right 1992   TONSILLECTOMY  1959   TOTAL SHOULDER ARTHROPLASTY Left 11/25/2012   Procedure: TOTAL SHOULDER ARTHROPLASTY;  Surgeon: Mable Paris, MD;  Location: [facility] OR;  Service: Orthopedics;  Laterality: Left;  LEFT TOTAL SHOULDER ARTHROPLASTY   TOTAL SHOULDER ARTHROPLASTY Right 12/22/2013   TOTAL SHOULDER ARTHROPLASTY Right 12/22/2013   Procedure: TOTAL SHOULDER ARTHROPLASTY;  Surgeon: Mable Paris, MD;  Location: [facility] OR;  Service: Orthopedics;  Laterality: Right;  Right shoulder total arthroplasty   TUBAL LIGATION  1985   Patient Active Problem List   Diagnosis Date Noted   Visceral obesity 01/13/2022   Other fatigue 01/13/2022   Other specified hypothyroidism 01/13/2022   Primary osteoarthritis involving multiple joints 01/13/2022   Class 1 obesity with serious comorbidity and body mass index (BMI) of 32.0 to  32.9 in adult 01/13/2022   Arthritis of shoulder region, right, degenerative 12/22/2013   Osteoarthritis of right shoulder 08/09/2013   Right tennis elbow 08/09/2013   Status post total shoulder replacement 11/26/2012   Fibromyalgia 08/15/2010   Chronic pain 08/15/2010   HYPOTHYROIDISM 05/15/2009   HYPERTENSION 05/15/2009   ALLERGIC RHINITIS 05/15/2009   DYSURIA 05/15/2009   History of colonic polyps 05/15/2009    PCP: Dr Dorthey Sawyer   REFERRING PROVIDER: Dr Dorthey Sawyer   REFERRING DIAG: Wedge compression fracture L5  Rationale for Evaluation and Treatment: Rehabilitation  THERAPY DIAG:  No diagnosis found.  ONSET DATE: 10/02/22  SUBJECTIVE:  SUBJECTIVE STATEMENT: Patient reports that her back is a lot better. She has been sore from some of the exercises here and at the gym. She has had no vertigo symptoms. Patient states her coccyx/sacral pain is mostly gone, states it flares up if she is sitting on a very firm surface but resolves quickly once she stands up.   PERTINENT HISTORY:  Bilat shoulder replacements; bilat OA knees; obesity  PAIN:  Are you having pain? Yes: NPRS scale: 4/10 Pain location: low back to tailbone  Pain description: mostly sore; aching; sharp at times  Aggravating factors: sitting on hard surface; driving  Relieving factors: rest; sometimes heat or ice   PRECAUTIONS: None   WEIGHT BEARING RESTRICTIONS: No  FALLS:  Has patient fallen in last 6 months? Yes. 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  DIAGNOSTIC FINDINGS:  None available   PATIENT SURVEYS:  FOTO 49; goal 64  MUSCLE LENGTH: Hamstrings: Right 60  deg; Left 65 deg Tight hip flexors bilat R > L   POSTURE: rounded shoulders, forward head, decreased lumbar lordosis, increased thoracic kyphosis, flexed trunk , and upper body shifted to L; knees flexed    PALPATION: Tenderness and tightness R > l lateral sacral border into coccyx   LUMBAR ROM:   AROM eval  Flexion 90%  Extension 40%  Right lateral flexion 70%  Left lateral flexion 65%  Right rotation 40%  Left rotation 45%    (Blank rows = not tested)  LOWER EXTREMITY ROM:  bilat knee flexion and extension limited R > L    *measure knee flexion and extension at next visit   Active  Right eval Left eval Right 11/1 Left 11/1  Hip flexion   107 110  Hip extension   -7 -3  Hip abduction      Hip adduction      Hip internal rotation      Hip external rotation      Knee flexion      Knee extension      Ankle dorsiflexion      Ankle plantarflexion      Ankle inversion      Ankle eversion       (Blank rows = not tested)  LOWER EXTREMITY MMT:    MMT Right eval Left eval  Hip flexion 4 5  Hip extension 3 3+  Hip abduction 3 4-  Hip adduction    Hip internal rotation    Hip external rotation    Knee flexion    Knee extension 4 4  Ankle dorsiflexion    Ankle plantarflexion    Ankle inversion    Ankle eversion     (Blank rows = not tested)  LUMBAR SPECIAL TESTS:  Straight leg raise test: Negative and Slump test: Negative  FUNCTIONAL TESTS:  5 times sit to stand: 11.10 sec  SLS L 5 sec; R 2 sec   Berg balance score  48     GAIT: Distance walked: 40 feet  Assistive device utilized: None Level of assistance: Complete Independence Comments: flexed forward posture    [facility] Adult PT Treatment:                                                DATE: 01/20/2023 Therapeutic Exercise: Nustep L 6 x 6 min  Supine  Marching alternating x 20 R/L  Bridges + hip ABD iso blue TB x 20 Dead bug x10 Trunk rotation x 10 R/L  SKTC x 10 sec x 5 R/L  DKTC x 10 x 5   Standing  1/4 Cantrell squat 10 sec x 10; ball btn knees 10 sec x 5; blue TB above knees 10 sec x 1 some L knee pain stopped  SLS focus on wt bearing through great toe ~ 10 sec x 5 R/L UE support as needed for balance  Heel raise 4 inch ball btn ankles x 10 UE support as needed for balance Hip extension standing x 10 R/L  Hip abduction leading with heel x 10 R/l    [facility] Adult PT Treatment:                                                DATE: 01/15/2023 Therapeutic Exercise: LTR --> figure 4 LR Bridges + hip ABD iso blue TB & ankle ADD yoga block x10 Dead bug x10 Pilates ab series (no bicycle) Bent over on counter, propped on elbows: Hip extension on diagonal x10 HS curls in extension x10 Standing TKE GTB x10 (B) Quarter Dowda squat + ball b/w knees x10 --> blue TB around knees x10 Prone quad stretch 3x30" (B) DKTC --> SKTC   [facility] Adult PT Treatment:                                                DATE: 01/13/23 Neuromuscular re-ed: Piedad Climes Halpike Rt - positive, Lt - negative Epley maneuver x 2 for Rt BPPV Semont maneuver   [facility] Adult PT Treatment:                                                DATE: 01/08/2023 Therapeutic Exercise: LTR --> figure 4 LR Pilates ab series (no bicycle) Bridges + hip abd iso GTB x10 --> added clamshell x10  Standing: Quarter Folkerts squat -->added ball b/w knees Hip hinge dead lift with against Kivett & rolling FR down/up Resisted squat with orange superband x10   PATIENT EDUCATION:  Education details: POC; HEP   Person educated: Patient Education method: Programmer, multimedia, Demonstration, Actor cues, Verbal cues, and Handouts Education comprehension: verbalized understanding, returned demonstration, verbal cues required, tactile cues required, and needs further education  HOME EXERCISE PROGRAM:  Access Code: [facility] URL: [email or web address] Date: 01/20/2023 Prepared by: Corlis Leak  Exercises - Single Leg Stance with Support  - 2 x daily - 7 x weekly - 1 sets - 3-5 reps - 10-20 sec  hold - Tandem Stance with Support  - 2 x daily - 7 x weekly - 1 sets - 3-5 reps - 10-20 sec  hold - Isometric Dead Bug  - 1 x daily - 7 x weekly - 1 sets - 3-5 reps - 10-20 sec hold - Hooklying Single Leg Bent Knee Fallouts with Resistance  - 1 x daily - 7 x weekly - 3 sets - 10 reps - Bridge with Hip Abduction and Resistance  - 1 x daily - 7 x weekly - 3 sets - 10 reps - Supine Lower Trunk Rotation  - 1 x daily - 7 x weekly - 3 sets - 10 reps - Seated Knee Extension with Resistance  - 1 x daily - 7 x weekly - 3 sets - 10 reps - 3-5 sec hold - Standing Terminal Knee Extension with Resistance  - 1 x daily - 7 x weekly - 2 sets - 10 reps - 5 sec hold - Single Leg Stretch  - 1 x daily - 7 x weekly - 3 sets - 10 reps - Double Leg Stretch Diagonal  - 1 x daily - 7 x weekly - 3 sets - 10 reps - Supine Leg Scissors  - 1 x daily - 7 x weekly - 3 sets - 10 reps - Bridge with Hip Abduction and Resistance  - 1 x daily - 7 x weekly - 3 sets - 10 reps - Gotsch Quarter Squat  - 1 x daily - 7 x weekly - 3 sets - 10 reps - Deadlift with Resistance  - 1 x daily - 7 x weekly - 3 sets - 10 reps - Seated Calf Raise With Small Ball at Heels  - 1 x daily - 7 x weekly - 2 sets - 10 reps - 1-2 sec  hold - Standing Hip Extension with Counter Support  - 2 x daily - 7 x weekly - 1-2 sets - 10 reps - 3-5 sec  hold - Standing Hip Abduction with Counter Support  - 1 x daily - 7 x weekly - 2-3 sets - 10 reps - 2-3 sec   hold  ASSESSMENT:  CLINICAL IMPRESSION: Patient continues to progress with strengthening and stabilization for lumbar spine and core as well as strengthening for LE's. LE exercises modified as needed for L knee pain.    VEST: Session today focused on addressing pt c/o dizziness. Pt positive for Rt BPPV. Pt with good response to Epley maneuver with 90% decrease in dizziness after 2 maneuvers. Pt give Semont maneuver for HEP as she states she has difficulty performing Epley at home. Pt very pleased with results of session and states she feels like her dizziness is "all gone".  EVAL: Patient is a 77 y.o. female who was seen today for physical therapy evaluation and treatment for wedge fracture L5 lumbar spine. She reports resolving pain primarily in the R sacral and coccyx area. She is most concerned with her balance and walking. She does not feel comfortable and secure with walking. Patient has history of bilat knee arthritis and vestibular dysfunction. She presents with poor posture and alignment; limited LE and trunk mobility/ROM; LE weakness; decreased stability with higher level balance activities; report of some dizziness with rolling; coccyx/sacral pain R>L. Patient will benefit from PT to address problems identified.    GOALS: Goals reviewed with patient? Yes  SHORT TERM GOALS: Target date: 02/10/2023  Independent initial HEP  Baseline: Goal status: INITIAL  2.  Further assess higher  level balance and vestibular disorder  Baseline:  Goal status: INITIAL   LONG TERM GOALS: Target date: 03/24/2023  Improve posture and alignment with patient to demonstrate more upright posture with increased hip and knee extension in standing  Baseline:  Goal status: INITIAL  2.  Increased LE strength to 4+/5 to 5/5  Baseline:  Goal status: INITIAL  3.  Increase knee extension  Baseline:  Goal status: INITIAL  4.  Patient reports no increased pain with sitting and/or driving > 1 hour   Baseline:  Goal status: INITIAL  5.  Independent in HEP, including aquatic program as indicated  Baseline:  Goal status: INITIAL  6.  Improve functional limitation score to 64 Baseline: 49 Goal status: INITIAL  7.  Pt will perform ADLs, IADLs and therapeutic exercise without recurrence of BPPV Baseline:  Goal status: INITIAL   PLAN:  PT FREQUENCY: 2x/week  PT DURATION: 12 weeks  PLANNED INTERVENTIONS: 97164- PT Re-evaluation, 97110-Therapeutic exercises, 97530- Therapeutic activity, 97112- Neuromuscular re-education, 97535- Self Care, 96295- Manual therapy, [id number]- Gait training, [phone]- Aquatic Therapy, 97014- Electrical stimulation (unattended), 97035- Ultrasound, 24401- Ionotophoresis 4mg /ml Dexamethasone, Patient/Family education, Balance training, Stair training, Taping, Dry Needling, Joint mobilization, Spinal mobilization, Cryotherapy, and Moist heat.  PLAN FOR NEXT SESSION: Progress knee and core strengthening; balance; manual work, DN, modalities as indicated.     Filicia Scogin Rober Minion, PT 01/20/2023, 2:00 PM

## 2023-01-22 ENCOUNTER — Ambulatory Visit: Payer: Medicare Other

## 2023-01-22 DIAGNOSIS — M6281 Muscle weakness (generalized): Secondary | ICD-10-CM

## 2023-01-22 DIAGNOSIS — M5459 Other low back pain: Secondary | ICD-10-CM

## 2023-01-22 DIAGNOSIS — R29898 Other symptoms and signs involving the musculoskeletal system: Secondary | ICD-10-CM

## 2023-01-22 DIAGNOSIS — R2689 Other abnormalities of gait and mobility: Secondary | ICD-10-CM

## 2023-01-22 NOTE — Therapy (Signed)
OUTPATIENT PHYSICAL THERAPY THORACOLUMBAR TREATMENT   Patient Name: Sally [name] MRN: [id number] DOB:01-01-1946, 77 y.o., female Today's Date: 01/22/2023  END OF SESSION:  PT End of Session - 01/22/23 1102     Visit Number 8    Number of Visits 24    Date for PT Re-Evaluation 03/25/23    Authorization Type medicare A/B; BCBS federal secondary    Progress Note Due on Visit 10    PT Start Time 1103    PT Stop Time 1141    PT Time Calculation (min) 38 min    Activity Tolerance Patient tolerated treatment well    Behavior During Therapy WFL for tasks assessed/performed             Past Medical History:  Diagnosis Date   Allergy    Arthritis    "all over"   Chronic pain syndrome    Chronic UTI    Fibromyalgia    Hx of colonic polyps    Hypertension    Hypothyroidism    Obesity    Pre-diabetes    "barely" (12/22/2013)   Past Surgical History:  Procedure Laterality Date   ABDOMINAL HYSTERECTOMY  1990   APPENDECTOMY  1965   CESAREAN SECTION  1980; 1985   CHOLECYSTECTOMY  1982   DILATION AND CURETTAGE OF UTERUS  1990   FOOT SURGERY  1992   KNEE ARTHROSCOPY Right 1992   TONSILLECTOMY  1959   TOTAL SHOULDER ARTHROPLASTY Left 11/25/2012   Procedure: TOTAL SHOULDER ARTHROPLASTY;  Surgeon: Mable Paris, MD;  Location: [facility] OR;  Service: Orthopedics;  Laterality: Left;  LEFT TOTAL SHOULDER ARTHROPLASTY   TOTAL SHOULDER ARTHROPLASTY Right 12/22/2013   TOTAL SHOULDER ARTHROPLASTY Right 12/22/2013   Procedure: TOTAL SHOULDER ARTHROPLASTY;  Surgeon: Mable Paris, MD;  Location: [facility] OR;  Service: Orthopedics;  Laterality: Right;  Right shoulder total arthroplasty   TUBAL LIGATION  1985   Patient Active Problem List   Diagnosis Date Noted   Visceral obesity 01/13/2022   Other fatigue 01/13/2022   Other specified hypothyroidism 01/13/2022   Primary osteoarthritis involving multiple joints 01/13/2022   Class 1 obesity with serious comorbidity and body mass  index (BMI) of 32.0 to 32.9 in adult 01/13/2022   Arthritis of shoulder region, right, degenerative 12/22/2013   Osteoarthritis of right shoulder 08/09/2013   Right tennis elbow 08/09/2013   Status post total shoulder replacement 11/26/2012   Fibromyalgia 08/15/2010   Chronic pain 08/15/2010   HYPOTHYROIDISM 05/15/2009   HYPERTENSION 05/15/2009   ALLERGIC RHINITIS 05/15/2009   DYSURIA 05/15/2009   History of colonic polyps 05/15/2009    PCP: Dr Dorthey Sawyer   REFERRING PROVIDER: Dr Dorthey Sawyer   REFERRING DIAG: Wedge compression fracture L5  Rationale for Evaluation and Treatment: Rehabilitation  THERAPY DIAG:  Muscle weakness (generalized)  Other symptoms and signs involving the musculoskeletal system  Other low back pain  Other abnormalities of gait and mobility  ONSET DATE: 10/02/22  SUBJECTIVE:  SUBJECTIVE STATEMENT: Patient reports she is stressed today and has a lot on her mind with work and life. Patient states her back feels great but her knees are sore (L>R). Patient reports she continues to be dizzy-free.  PERTINENT HISTORY:  Bilat shoulder replacements; bilat OA knees; obesity  PAIN:  Are you having pain? Yes: NPRS scale: 4/10 Pain location: low back to tailbone  Pain description: mostly sore; aching; sharp at times  Aggravating factors: sitting on hard surface; driving  Relieving factors: rest; sometimes heat or ice   PRECAUTIONS: None   WEIGHT BEARING RESTRICTIONS: No  FALLS:  Has patient fallen in last 6 months? Yes. 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  DIAGNOSTIC FINDINGS:  None available   PATIENT SURVEYS:  FOTO 49; goal 64  MUSCLE LENGTH: Hamstrings: Right 60  deg; Left 65 deg Tight hip flexors bilat R > L   POSTURE: rounded shoulders, forward head, decreased lumbar lordosis, increased thoracic kyphosis, flexed trunk , and upper body shifted to L; knees flexed    PALPATION: Tenderness and tightness R > l lateral sacral border into coccyx   LUMBAR ROM:   AROM eval  Flexion 90%  Extension 40%  Right lateral flexion 70%  Left lateral flexion 65%  Right rotation 40%  Left rotation 45%    (Blank rows = not tested)  LOWER EXTREMITY ROM:  bilat knee flexion and extension limited R > L    *measure knee flexion and extension at next visit   Active  Right eval Left eval Right 11/1 Left 11/1  Hip flexion   107 110  Hip extension   -7 -3  Hip abduction      Hip adduction      Hip internal rotation      Hip external rotation      Knee flexion      Knee extension      Ankle dorsiflexion      Ankle plantarflexion      Ankle inversion      Ankle eversion       (Blank rows = not tested)  LOWER EXTREMITY MMT:    MMT Right eval Left eval  Hip flexion 4 5  Hip extension 3 3+  Hip abduction 3 4-  Hip adduction    Hip internal rotation    Hip external rotation    Knee flexion    Knee extension 4 4  Ankle dorsiflexion    Ankle plantarflexion    Ankle inversion    Ankle eversion     (Blank rows = not tested)  LUMBAR SPECIAL TESTS:  Straight leg raise test: Negative and Slump test: Negative  FUNCTIONAL TESTS:  5 times sit to stand:  11.10 sec  SLS L 5 sec; R 2 sec   Berg balance score 48     GAIT: Distance walked: 40 feet  Assistive device utilized: None Level of assistance: Complete Independence Comments: flexed  forward posture    [facility] Adult PT Treatment:                                                DATE: 01/22/2023 Therapeutic Exercise: NuStep L7 x Tandem walking --> added head turns/nods every 3 steps Slow marching + opp knee tap Diaphragmatic breathing --> counted breathing SKTC + breathing Heel raises + 4" ball b/w ankles 2x10 SLB --> added fingertip support Neuzil squat --> discontinued d/t knee pain Resisted squat + blue TB --> discontinued d/t knee pain Supine SLR in ER x10 (B)    [facility] Adult PT Treatment:                                                DATE: 01/20/2023 Therapeutic Exercise: Nustep L 6 x 6 min  Supine  Marching alternating x 20 R/L  Bridges + hip ABD iso blue TB x 20 Dead bug x10 Trunk rotation x 10 R/L  SKTC x 10 sec x 5 R/L  DKTC x 10 x 5   Standing  1/4 Govoni squat 10 sec x 10; ball btn knees 10 sec x 5; blue TB above knees 10 sec x 1 some L knee pain stopped  SLS focus on wt bearing through great toe ~ 10 sec x 5 R/L UE support as needed for balance  Heel raise 4 inch ball btn ankles x 10 UE support as needed for balance Hip extension standing x 10 R/L  Hip abduction leading with heel x 10 R/l    [facility] Adult PT Treatment:                                                DATE: 01/15/2023 Therapeutic Exercise: LTR --> figure 4 LR Bridges + hip ABD iso blue TB & ankle ADD yoga block x10 Dead bug x10 Pilates ab series (no bicycle) Bent over on counter, propped on elbows: Hip extension on diagonal x10 HS curls in extension x10 Standing TKE GTB x10 (B) Quarter Ben squat + ball b/w knees x10 --> blue TB around knees x10 Prone quad stretch 3x30" (B) DKTC --> SKTC   PATIENT EDUCATION:  Education details: POC; HEP  Person educated: Patient Education method:  Programmer, multimedia, Demonstration, Tactile cues, Verbal cues, and Handouts Education comprehension: verbalized understanding, returned demonstration, verbal cues required, tactile cues required, and needs further education  HOME EXERCISE PROGRAM:  Access Code: [facility] URL: [email or web address] Date: 01/20/2023 Prepared by: Corlis Leak  Exercises - Single Leg Stance with Support  - 2 x daily - 7 x weekly - 1 sets - 3-5 reps - 10-20 sec  hold - Tandem Stance with Support  - 2 x daily - 7 x weekly - 1 sets - 3-5 reps - 10-20 sec  hold - Isometric Dead Bug  - 1 x daily - 7 x weekly - 1 sets - 3-5 reps - 10-20 sec hold - Hooklying Single Leg Bent Knee Fallouts with Resistance  - 1 x daily - 7 x weekly - 3 sets - 10 reps - Bridge with Hip Abduction and  Resistance  - 1 x daily - 7 x weekly - 3 sets - 10 reps - Supine Lower Trunk Rotation  - 1 x daily - 7 x weekly - 3 sets - 10 reps - Seated Knee Extension with Resistance  - 1 x daily - 7 x weekly - 3 sets - 10 reps - 3-5 sec hold - Standing Terminal Knee Extension with Resistance  - 1 x daily - 7 x weekly - 2 sets - 10 reps - 5 sec hold - Single Leg Stretch  - 1 x daily - 7 x weekly - 3 sets - 10 reps - Double Leg Stretch Diagonal  - 1 x daily - 7 x weekly - 3 sets - 10 reps - Supine Leg Scissors  - 1 x daily - 7 x weekly - 3 sets - 10 reps - Bridge with Hip Abduction and Resistance  - 1 x daily - 7 x weekly - 3 sets - 10 reps - Hartl Quarter Squat  - 1 x daily - 7 x weekly - 3 sets - 10 reps - Deadlift with Resistance  - 1 x daily - 7 x weekly - 3 sets - 10 reps - Seated Calf Raise With Small Ball at Heels  - 1 x daily - 7 x weekly - 2 sets - 10 reps - 1-2 sec  hold - Standing Hip Extension with Counter Support  - 2 x daily - 7 x weekly - 1-2 sets - 10 reps - 3-5 sec  hold - Standing Hip Abduction with Counter Support  - 1 x daily - 7 x weekly - 2-3 sets - 10 reps - 2-3 sec  hold  ASSESSMENT:  CLINICAL IMPRESSION: Dynamic balance  exercises incorporated to challenge core and single leg stability. Fingertip support added during single leg balance to improve stability. Patient has difficulty with squatting exercises due to increased bilateral knee pain.    VEST: Session today focused on addressing pt c/o dizziness. Pt positive for Rt BPPV. Pt with good response to Epley maneuver with 90% decrease in dizziness after 2 maneuvers. Pt give Semont maneuver for HEP as she states she has difficulty performing Epley at home. Pt very pleased with results of session and states she feels like her dizziness is "all gone".  EVAL: Patient is a 77 y.o. female who was seen today for physical therapy evaluation and treatment for wedge fracture L5 lumbar spine. She reports resolving pain primarily in the R sacral and coccyx area. She is most concerned with her balance and walking. She does not feel comfortable and secure with walking. Patient has history of bilat knee arthritis and vestibular dysfunction. She presents with poor posture and alignment; limited LE and trunk mobility/ROM; LE weakness; decreased stability with higher level balance activities; report of some dizziness with rolling; coccyx/sacral pain R>L. Patient will benefit from PT to address problems identified.    GOALS: Goals reviewed with patient? Yes  SHORT TERM GOALS: Target date: 02/10/2023  Independent initial HEP  Baseline: Goal status: INITIAL  2.  Further assess higher level balance and vestibular disorder  Baseline:  Goal status: MET   LONG TERM GOALS: Target date: 03/24/2023  Improve posture and alignment with patient to demonstrate more upright posture with increased hip and knee extension in standing  Baseline:  Goal status: INITIAL  2.  Increased LE strength to 4+/5 to 5/5  Baseline:  Goal status: INITIAL  3.  Increase knee extension  Baseline:  Goal status: INITIAL  4.  Patient reports no increased pain with sitting and/or driving > 1 hour   Baseline:  Goal status: INITIAL  5.  Independent in HEP, including aquatic program as indicated  Baseline:  Goal status: INITIAL  6.  Improve functional limitation score to 64 Baseline: 49 Goal status: INITIAL  7.  Pt will perform ADLs, IADLs and therapeutic exercise without recurrence of BPPV Baseline:  Goal status: INITIAL   PLAN:  PT FREQUENCY: 2x/week  PT DURATION: 12 weeks  PLANNED INTERVENTIONS: 97164- PT Re-evaluation, 97110-Therapeutic exercises, 97530- Therapeutic activity, 97112- Neuromuscular re-education, 97535- Self Care, 16109- Manual therapy, [id number]- Gait training, [phone]- Aquatic Therapy, 97014- Electrical stimulation (unattended), [id number]- Ultrasound, 09811- Ionotophoresis 4mg /ml Dexamethasone, Patient/Family education, Balance training, Stair training, Taping, Dry Needling, Joint mobilization, Spinal mobilization, Cryotherapy, and Moist heat.  PLAN FOR NEXT SESSION: Progress knee and core strengthening; balance; manual work, DN, modalities as indicated.     Sanjuana Mae, PTA 01/22/2023, 11:45 AM

## 2023-01-27 ENCOUNTER — Ambulatory Visit: Payer: Medicare Other | Admitting: Physical Therapy

## 2023-02-03 ENCOUNTER — Ambulatory Visit: Payer: Medicare Other | Attending: Family Medicine

## 2023-02-03 DIAGNOSIS — M6281 Muscle weakness (generalized): Secondary | ICD-10-CM | POA: Insufficient documentation

## 2023-02-03 DIAGNOSIS — M170 Bilateral primary osteoarthritis of knee: Secondary | ICD-10-CM | POA: Diagnosis present

## 2023-02-03 DIAGNOSIS — M5459 Other low back pain: Secondary | ICD-10-CM | POA: Insufficient documentation

## 2023-02-03 DIAGNOSIS — R2689 Other abnormalities of gait and mobility: Secondary | ICD-10-CM | POA: Insufficient documentation

## 2023-02-03 DIAGNOSIS — R29898 Other symptoms and signs involving the musculoskeletal system: Secondary | ICD-10-CM | POA: Diagnosis present

## 2023-02-03 DIAGNOSIS — R262 Difficulty in walking, not elsewhere classified: Secondary | ICD-10-CM | POA: Insufficient documentation

## 2023-02-03 NOTE — Therapy (Signed)
OUTPATIENT PHYSICAL THERAPY THORACOLUMBAR TREATMENT   Patient Name: Sally [name] MRN: [id number] DOB:Apr 22, 1945, 77 y.o., female Today's Date: 02/03/2023  END OF SESSION:  PT End of Session - 02/03/23 1110     Visit Number 9    Number of Visits 24    Date for PT Re-Evaluation 03/25/23    Authorization Type medicare A/B; BCBS federal secondary    Progress Note Due on Visit 10    PT Start Time 1111    PT Stop Time 1149    PT Time Calculation (min) 38 min    Activity Tolerance Patient tolerated treatment well    Behavior During Therapy WFL for tasks assessed/performed             Past Medical History:  Diagnosis Date   Allergy    Arthritis    "all over"   Chronic pain syndrome    Chronic UTI    Fibromyalgia    Hx of colonic polyps    Hypertension    Hypothyroidism    Obesity    Pre-diabetes    "barely" (12/22/2013)   Past Surgical History:  Procedure Laterality Date   ABDOMINAL HYSTERECTOMY  1990   APPENDECTOMY  1965   CESAREAN SECTION  1980; 1985   CHOLECYSTECTOMY  1982   DILATION AND CURETTAGE OF UTERUS  1990   FOOT SURGERY  1992   KNEE ARTHROSCOPY Right 1992   TONSILLECTOMY  1959   TOTAL SHOULDER ARTHROPLASTY Left 11/25/2012   Procedure: TOTAL SHOULDER ARTHROPLASTY;  Surgeon: Mable Paris, MD;  Location: [facility] OR;  Service: Orthopedics;  Laterality: Left;  LEFT TOTAL SHOULDER ARTHROPLASTY   TOTAL SHOULDER ARTHROPLASTY Right 12/22/2013   TOTAL SHOULDER ARTHROPLASTY Right 12/22/2013   Procedure: TOTAL SHOULDER ARTHROPLASTY;  Surgeon: Mable Paris, MD;  Location: [facility] OR;  Service: Orthopedics;  Laterality: Right;  Right shoulder total arthroplasty   TUBAL LIGATION  1985   Patient Active Problem List   Diagnosis Date Noted   Visceral obesity 01/13/2022   Other fatigue 01/13/2022   Other specified hypothyroidism 01/13/2022   Primary osteoarthritis involving multiple joints 01/13/2022   Class 1 obesity with serious comorbidity and body mass  index (BMI) of 32.0 to 32.9 in adult 01/13/2022   Arthritis of shoulder region, right, degenerative 12/22/2013   Osteoarthritis of right shoulder 08/09/2013   Right tennis elbow 08/09/2013   Status post total shoulder replacement 11/26/2012   Fibromyalgia 08/15/2010   Chronic pain 08/15/2010   HYPOTHYROIDISM 05/15/2009   HYPERTENSION 05/15/2009   ALLERGIC RHINITIS 05/15/2009   DYSURIA 05/15/2009   History of colonic polyps 05/15/2009    PCP: Dr Dorthey Sawyer   REFERRING PROVIDER: Dr Dorthey Sawyer   REFERRING DIAG: Wedge compression fracture L5  Rationale for Evaluation and Treatment: Rehabilitation  THERAPY DIAG:  Muscle weakness (generalized)  Other symptoms and signs involving the musculoskeletal system  Other low back pain  Other abnormalities of gait and mobility  Osteoarthritis of both knees, unspecified osteoarthritis type  Difficulty in walking, not elsewhere classified  ONSET DATE: 10/02/22  SUBJECTIVE:  SUBJECTIVE STATEMENT: Patient reports she is sore "all over" today, states she had to miss last week due to a virus and returned to gym yesterday. Patient states she is still dizzy-free and has had no tailbone pain.   PERTINENT HISTORY:  Bilat shoulder replacements; bilat OA knees; obesity  PAIN:  Are you having pain? Yes: NPRS scale: 4/10 Pain location: low back to tailbone  Pain description: mostly sore; aching; sharp at times  Aggravating factors: sitting on hard surface; driving  Relieving factors: rest; sometimes heat or ice   PRECAUTIONS: None   WEIGHT BEARING RESTRICTIONS: No  FALLS:  Has patient fallen in last 6 months? Yes. 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  DIAGNOSTIC FINDINGS:  None available   PATIENT SURVEYS:  FOTO 49; goal 64  MUSCLE LENGTH: Hamstrings: Right 60  deg; Left 65 deg Tight hip flexors bilat R > L   POSTURE: rounded shoulders, forward head, decreased lumbar lordosis, increased thoracic kyphosis, flexed trunk , and upper body shifted to L; knees flexed    PALPATION: Tenderness and tightness R > l lateral sacral border into coccyx   LUMBAR ROM:   AROM eval  Flexion 90%  Extension 40%  Right lateral flexion 70%  Left lateral flexion 65%  Right rotation 40%  Left rotation 45%    (Blank rows = not tested)  LOWER EXTREMITY ROM:  bilat knee flexion and extension limited R > L    *measure knee flexion and extension at next visit   Active  Right eval Left eval Right 11/1 Left 11/1 Right 12/3 Left 12/3   Hip flexion   107 110 112 110  Hip extension   -7 -3 -7 -2  Hip abduction        Hip adduction        Hip internal rotation        Hip external rotation        Knee flexion        Knee extension        Ankle dorsiflexion        Ankle plantarflexion        Ankle inversion        Ankle eversion         (Blank rows = not tested)  LOWER EXTREMITY MMT:    MMT Right eval Left eval  Hip flexion 4 5  Hip extension 3 3+  Hip abduction 3 4-  Hip adduction    Hip internal rotation    Hip external rotation    Knee flexion    Knee extension 4 4  Ankle dorsiflexion    Ankle plantarflexion    Ankle inversion    Ankle  eversion     (Blank rows = not tested)  LUMBAR SPECIAL TESTS:  Straight leg raise test: Negative and Slump test: Negative  FUNCTIONAL TESTS:  5 times sit to stand: 11.10 sec  SLS L 5 sec;  R 2 sec   Berg balance score 48     GAIT: Distance walked: 40 feet  Assistive device utilized: None Level of assistance: Complete Independence Comments: flexed forward posture    [facility] Adult PT Treatment:                                                DATE: 02/03/2023 Therapeutic Exercise: NuStep L6 x Knee measurements (see above) Supine quad set  10x5" (B) SLR 10x5" (B) SAQ (green bolster) + 5#AW 2x10x5" Seated knee flexion stretch 10x10" (B)   [facility] Adult PT Treatment:                                                DATE: 01/22/2023 Therapeutic Exercise: NuStep L7 x Tandem walking --> added head turns/nods every 3 steps Slow marching + opp knee tap Diaphragmatic breathing --> counted breathing SKTC + breathing Heel raises + 4" ball b/w ankles 2x10 SLB --> added fingertip support Prowse squat --> discontinued d/t knee pain Resisted squat + blue TB --> discontinued d/t knee pain Supine SLR in ER x10 (B)    [facility] Adult PT Treatment:                                                DATE: 01/20/2023 Therapeutic Exercise: Nustep L 6 x 6 min  Supine  Marching alternating x 20 R/L  Bridges + hip ABD iso blue TB x 20 Dead bug x10 Trunk rotation x 10 R/L  SKTC x 10 sec x 5 R/L  DKTC x 10 x 5   Standing  1/4 Heeren squat 10 sec x 10; ball btn knees 10 sec x 5; blue TB above knees 10 sec x 1 some L knee pain stopped  SLS focus on wt bearing through great toe ~ 10 sec x 5 R/L UE support as needed for balance  Heel raise 4 inch ball btn ankles x 10 UE support as needed for balance Hip extension standing x 10 R/L  Hip abduction leading with heel x 10 R/l    PATIENT EDUCATION:  Education details: POC; HEP  Person educated: Patient Education method: Programmer, multimedia, Demonstration,  Tactile cues, Verbal cues, and Handouts Education comprehension: verbalized understanding, returned demonstration, verbal cues required, tactile cues required, and needs further education  HOME EXERCISE PROGRAM:  Access Code: [facility] URL: [email or web address] Date: 02/03/2023 Prepared by: Carlynn Herald  Exercises - Single Leg Stance with Support  - 2 x daily - 7 x weekly - 1 sets - 3-5 reps - 10-20 sec  hold - Tandem Stance with Support  - 2 x daily - 7 x weekly - 1 sets - 3-5 reps - 10-20 sec  hold - Single Leg Stretch  - 1 x daily - 7 x weekly - 3 sets - 10 reps - Double Leg Stretch Diagonal  - 1 x daily - 7 x weekly - 3 sets - 10 reps - Supine Leg Scissors  - 1 x daily - 7 x weekly - 3 sets - 10 reps - Bridge with Hip Abduction and  Resistance  - 1 x daily - 7 x weekly - 3 sets - 10 reps - Deadlift with Resistance  - 1 x daily - 7 x weekly - 3 sets - 10 reps - Supine Lower Trunk Rotation  - 1 x daily - 7 x weekly - 3 sets - 10 reps - Sidelying Thoracic Rotation with Open Book  - 1 x daily - 7 x weekly - 3 sets - 10 reps - Seated Hamstring Stretch  - 1 x daily - 7 x weekly - 1 sets - 3-5 reps - 10-30 sec hold - Sit to Stand  - 1 x daily - 7 x weekly - 3 sets - 10 reps - Seated Knee Flexion Stretch  - 1 x daily - 7 x weekly - 1-2 sets - 10 reps - 10 sec hold - Seated Knee Extension Stretch with Chair  - 1 x daily - 7 x weekly - 1 sets - 1-3 reps - 1-2 min hold  ASSESSMENT:  CLINICAL IMPRESSION: Session focused on progressing knee mobility and AROM. HEP updated with knee stretches to progress AROM, as well as exercises focused on overall mobility and flexibility to do first thing in the morning for improved mobility and stability.  VEST: Session today focused on addressing pt c/o dizziness. Pt positive for Rt BPPV. Pt with good response to Epley maneuver with 90% decrease in dizziness after 2 maneuvers. Pt give Semont maneuver for HEP as she states she has difficulty  performing Epley at home. Pt very pleased with results of session and states she feels like her dizziness is "all gone".  EVAL: Patient is a 77 y.o. female who was seen today for physical therapy evaluation and treatment for wedge fracture L5 lumbar spine. She reports resolving pain primarily in the R sacral and coccyx area. She is most concerned with her balance and walking. She does not feel comfortable and secure with walking. Patient has history of bilat knee arthritis and vestibular dysfunction. She presents with poor posture and alignment; limited LE and trunk mobility/ROM; LE weakness; decreased stability with higher level balance activities; report of some dizziness with rolling; coccyx/sacral pain R>L. Patient will benefit from PT to address problems identified.    GOALS: Goals reviewed with patient? Yes  SHORT TERM GOALS: Target date: 02/10/2023  Independent initial HEP  Baseline: Goal status: INITIAL  2.  Further assess higher level balance and vestibular disorder  Baseline:  Goal status: MET   LONG TERM GOALS: Target date: 03/24/2023  Improve posture and alignment with patient to demonstrate more upright posture with increased hip and knee extension in standing  Baseline:  Goal status: INITIAL  2.  Increased LE strength to 4+/5 to 5/5  Baseline:  Goal status: INITIAL  3.  Increase knee extension  Baseline:  Goal status: INITIAL  4.  Patient reports no increased pain with sitting and/or driving > 1 hour  Baseline:  Goal status: INITIAL  5.  Independent in HEP, including aquatic program as indicated  Baseline:  Goal status: INITIAL  6.  Improve functional limitation score to 64 Baseline: 49 Goal status: INITIAL  7.  Pt will perform ADLs, IADLs and therapeutic exercise without recurrence of BPPV Baseline:  Goal status: INITIAL   PLAN:  PT FREQUENCY: 2x/week  PT DURATION: 12 weeks  PLANNED INTERVENTIONS: 97164- PT Re-evaluation, 97110-Therapeutic  exercises, 97530- Therapeutic activity, [id number]- Neuromuscular re-education, 97535- Self Care, 16109- Manual therapy, [id number]- Gait training, [id number]- Aquatic Therapy, 97014- Electrical stimulation (unattended), [id number]- Ultrasound,  40981- Ionotophoresis 4mg /ml Dexamethasone, Patient/Family education, Balance training, Stair training, Taping, Dry Needling, Joint mobilization, Spinal mobilization, Cryotherapy, and Moist heat.  PLAN FOR NEXT SESSION: Focus on progress knee AROM; core strengthening & balance.     Sanjuana Mae, PTA 02/03/2023, 11:49 AM

## 2023-02-05 ENCOUNTER — Ambulatory Visit: Payer: Medicare Other

## 2023-02-05 DIAGNOSIS — M170 Bilateral primary osteoarthritis of knee: Secondary | ICD-10-CM

## 2023-02-05 DIAGNOSIS — R29898 Other symptoms and signs involving the musculoskeletal system: Secondary | ICD-10-CM

## 2023-02-05 DIAGNOSIS — M6281 Muscle weakness (generalized): Secondary | ICD-10-CM | POA: Diagnosis not present

## 2023-02-05 DIAGNOSIS — M5459 Other low back pain: Secondary | ICD-10-CM

## 2023-02-05 DIAGNOSIS — R262 Difficulty in walking, not elsewhere classified: Secondary | ICD-10-CM

## 2023-02-05 DIAGNOSIS — R2689 Other abnormalities of gait and mobility: Secondary | ICD-10-CM

## 2023-02-05 NOTE — Therapy (Signed)
OUTPATIENT PHYSICAL THERAPY THORACOLUMBAR TREATMENT Progress Note  Reporting Period 12/31/2022 to 02/05/2023  See note below for Objective Data and Assessment of Progress/Goals.      Patient Name: Sally [name] MRN: [id number] DOB:Jul 26, 1945, 77 y.o., female Today's Date: 02/05/2023  END OF SESSION:  PT End of Session - 02/05/23 1154     Visit Number 10    Number of Visits 24    Date for PT Re-Evaluation 03/25/23    Authorization Type medicare A/B; BCBS federal secondary    Progress Note Due on Visit 10    PT Start Time 1154    PT Stop Time 1234    PT Time Calculation (min) 40 min    Activity Tolerance Patient tolerated treatment well    Behavior During Therapy WFL for tasks assessed/performed             Past Medical History:  Diagnosis Date   Allergy    Arthritis    "all over"   Chronic pain syndrome    Chronic UTI    Fibromyalgia    Hx of colonic polyps    Hypertension    Hypothyroidism    Obesity    Pre-diabetes    "barely" (12/22/2013)   Past Surgical History:  Procedure Laterality Date   ABDOMINAL HYSTERECTOMY  1990   APPENDECTOMY  1965   CESAREAN SECTION  1980; 1985   CHOLECYSTECTOMY  1982   DILATION AND CURETTAGE OF UTERUS  1990   FOOT SURGERY  1992   KNEE ARTHROSCOPY Right 1992   TONSILLECTOMY  1959   TOTAL SHOULDER ARTHROPLASTY Left 11/25/2012   Procedure: TOTAL SHOULDER ARTHROPLASTY;  Surgeon: Mable Paris, MD;  Location: [facility] OR;  Service: Orthopedics;  Laterality: Left;  LEFT TOTAL SHOULDER ARTHROPLASTY   TOTAL SHOULDER ARTHROPLASTY Right 12/22/2013   TOTAL SHOULDER ARTHROPLASTY Right 12/22/2013   Procedure: TOTAL SHOULDER ARTHROPLASTY;  Surgeon: Mable Paris, MD;  Location: [facility] OR;  Service: Orthopedics;  Laterality: Right;  Right shoulder total arthroplasty   TUBAL LIGATION  1985   Patient Active Problem List   Diagnosis Date Noted   Visceral obesity 01/13/2022   Other fatigue 01/13/2022   Other specified  hypothyroidism 01/13/2022   Primary osteoarthritis involving multiple joints 01/13/2022   Class 1 obesity with serious comorbidity and body mass index (BMI) of 32.0 to 32.9 in adult 01/13/2022   Arthritis of shoulder region, right, degenerative 12/22/2013   Osteoarthritis of right shoulder 08/09/2013   Right tennis elbow 08/09/2013   Status post total shoulder replacement 11/26/2012   Fibromyalgia 08/15/2010   Chronic pain 08/15/2010   HYPOTHYROIDISM 05/15/2009   HYPERTENSION 05/15/2009   ALLERGIC RHINITIS 05/15/2009   DYSURIA 05/15/2009   History of colonic polyps 05/15/2009    PCP: Dr Dorthey Sawyer   REFERRING PROVIDER: Dr Dorthey Sawyer   REFERRING DIAG: Wedge compression fracture L5  Rationale for Evaluation and Treatment: Rehabilitation  THERAPY DIAG:  Muscle weakness (generalized)  Other symptoms and signs involving the musculoskeletal system  Other low back pain  Osteoarthritis of both knees, unspecified osteoarthritis type  Difficulty in walking, not elsewhere classified  Other abnormalities of gait and mobility  ONSET DATE: 10/02/22  SUBJECTIVE:  SUBJECTIVE STATEMENT: Patient reports the pain above her tailbone has been gone for a few weeks. Patient states she is less sore today; states she is having mild pain behind L knee today.   PERTINENT HISTORY:  Bilat shoulder replacements; bilat OA knees; obesity  PAIN:  Are you having pain? Yes: NPRS scale: 4/10 Pain location: low back to tailbone  Pain description: mostly sore; aching; sharp at times  Aggravating factors: sitting on hard surface; driving  Relieving factors: rest; sometimes heat or ice   PRECAUTIONS: None   WEIGHT BEARING RESTRICTIONS: No  FALLS:  Has patient fallen in last 6 months? Yes. 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  DIAGNOSTIC FINDINGS:  None available   PATIENT SURVEYS:  FOTO 49; goal 64  MUSCLE LENGTH: Hamstrings: Right 60  deg; Left 65 deg Tight hip flexors bilat R > L   POSTURE: rounded shoulders, forward head, decreased lumbar lordosis, increased thoracic kyphosis, flexed trunk , and upper body shifted to L; knees flexed    PALPATION: Tenderness and tightness R > l lateral sacral border into coccyx   LUMBAR ROM:   AROM eval  Flexion 90%  Extension 40%  Right lateral flexion 70%  Left lateral flexion 65%  Right rotation 40%  Left rotation 45%    (Blank rows = not tested)  LOWER EXTREMITY ROM:  bilat knee flexion and extension limited R > L    *measure knee flexion and extension at next visit   Active  Right eval Left eval Right 11/1 Left 11/1 Right 12/3 Left 12/3 Right 12/5 Left 12/5   Hip flexion   107 110 112 110 112 115  Hip extension   -7 -3 -7 -2 -6 -2  Hip abduction          Hip adduction          Hip internal rotation          Hip external rotation          Knee flexion          Knee extension          Ankle dorsiflexion          Ankle plantarflexion          Ankle inversion          Ankle eversion           (Blank rows = not tested)  LOWER EXTREMITY MMT:    MMT Right eval Left eval Right 12/5 Left 12/5  Hip flexion 4 5 4+ 4+  Hip extension 3 3+ 4+ 4+   Hip abduction 3 4- 4+ 4+  Hip adduction      Hip internal rotation      Hip external rotation      Knee flexion      Knee extension 4 4 4+ 4+  Ankle dorsiflexion      Ankle plantarflexion  Ankle inversion      Ankle eversion       (Blank rows = not tested)  LUMBAR SPECIAL TESTS:  Straight leg raise test: Negative and Slump test: Negative  FUNCTIONAL TESTS:  5 times sit to stand: 11.10 sec  SLS L 5 sec; R 2 sec   Berg balance score 48     GAIT: Distance walked: 40 feet  Assistive device utilized: None Level of assistance: Complete Independence Comments: flexed forward posture    [facility] Adult PT Treatment:                                                DATE: 02/05/2023 Therapeutic Exercise: NuStep L6 x Updating LTGs Prone:  Quad set 12x5" (B) Straight leg hip ext x10 (B) Bent knee hip ext x10 (B) SAQ + 5#AW 2x10x5" (B)   [facility] Adult PT Treatment:                                                DATE: 02/03/2023 Therapeutic Exercise: NuStep L6 x Knee measurements (see above) Supine quad set  10x5" (B) SLR 10x5" (B) SAQ (green bolster) + 5#AW 2x10x5" Seated knee flexion stretch 10x10" (B)   [facility] Adult PT Treatment:                                                DATE: 01/22/2023 Therapeutic Exercise: NuStep L7 x Tandem walking --> added head turns/nods every 3 steps Slow marching + opp knee tap Diaphragmatic breathing --> counted breathing SKTC + breathing Heel raises + 4" ball b/w ankles 2x10 SLB --> added fingertip support Valin squat --> discontinued d/t knee pain Resisted squat + blue TB --> discontinued d/t knee pain Supine SLR in ER x10 (B)   PATIENT EDUCATION:  Education details: Updated HEP  Person educated: Patient Education method: Explanation, Demonstration, Tactile cues, Verbal cues, and Handouts Education comprehension: verbalized understanding, returned demonstration, verbal cues required, tactile cues required, and needs  further education  HOME EXERCISE PROGRAM:  Access Code: [facility] URL: [email or web address] Date: 02/03/2023 Prepared by: Carlynn Herald  Exercises - Single Leg Stance with Support  - 2 x daily - 7 x weekly - 1 sets - 3-5 reps - 10-20 sec  hold - Tandem Stance with Support  - 2 x daily - 7 x weekly - 1 sets - 3-5 reps - 10-20 sec  hold - Single Leg Stretch  - 1 x daily - 7 x weekly - 3 sets - 10 reps - Double Leg Stretch Diagonal  - 1 x daily - 7 x weekly - 3 sets - 10 reps - Supine Leg Scissors  - 1 x daily - 7 x weekly - 3 sets - 10 reps - Bridge with Hip Abduction and Resistance  - 1 x daily - 7 x weekly - 3 sets - 10 reps - Deadlift with Resistance  - 1 x daily - 7 x weekly - 3 sets - 10 reps - Supine Lower Trunk Rotation  - 1 x daily - 7 x weekly - 3 sets -  10 reps - Sidelying Thoracic Rotation with Open Book  - 1 x daily - 7 x weekly - 3 sets - 10 reps - Seated Hamstring Stretch  - 1 x daily - 7 x weekly - 1 sets - 3-5 reps - 10-30 sec hold - Sit to Stand  - 1 x daily - 7 x weekly - 3 sets - 10 reps - Seated Knee Flexion Stretch  - 1 x daily - 7 x weekly - 1-2 sets - 10 reps - 10 sec hold - Seated Knee Extension Stretch with Chair  - 1 x daily - 7 x weekly - 1 sets - 1-3 reps - 1-2 min hold  ASSESSMENT:  CLINICAL IMPRESSION: Patient has made good progress with bilateral hip and knee strength as measured by MMT. Patient continues to report no vestibular symptoms or dizziness since vestibular evaluation and Epley treatment. Minimal progression exhibited with bilateral total knee extension since initial evaluation; steady progress noted with bilateral knee flexion AROM. Patient will continue to benefit from continued skilled therapy to progress bilateral knee mobility and strength and improve postural alignment and stability.  VEST: Session today focused on addressing pt c/o dizziness. Pt positive for Rt BPPV. Pt with good response to Epley maneuver with 90% decrease in  dizziness after 2 maneuvers. Pt give Semont maneuver for HEP as she states she has difficulty performing Epley at home. Pt very pleased with results of session and states she feels like her dizziness is "all gone".  EVAL: Patient is a 77 y.o. female who was seen today for physical therapy evaluation and treatment for wedge fracture L5 lumbar spine. She reports resolving pain primarily in the R sacral and coccyx area. She is most concerned with her balance and walking. She does not feel comfortable and secure with walking. Patient has history of bilat knee arthritis and vestibular dysfunction. She presents with poor posture and alignment; limited LE and trunk mobility/ROM; LE weakness; decreased stability with higher level balance activities; report of some dizziness with rolling; coccyx/sacral pain R>L. Patient will benefit from PT to address problems identified.    GOALS: Goals reviewed with patient? Yes  SHORT TERM GOALS: Target date: 02/10/2023  Independent initial HEP  Baseline: Goal status: MET  2.  Further assess higher level balance and vestibular disorder  Baseline:  Goal status: MET   LONG TERM GOALS: Target date: 03/24/2023  Improve posture and alignment with patient to demonstrate more upright posture with increased hip and knee extension in standing  Baseline:  Goal status: IN PROGRESS  2.  Increased LE strength to 4+/5 to 5/5  Baseline:  Goal status: MET  3.  Increase knee extension  Baseline: see above Goal status: IN PROGRESS  4.  Patient reports no increased pain with sitting and/or driving > 1 hour  Baseline: no pain  Goal status: MET  5.  Independent in HEP, including aquatic program as indicated  Baseline:  Goal status: MET  6.  Improve functional limitation score to 64 Baseline: 75 Goal status: MET  7.  Pt will perform ADLs, IADLs and therapeutic exercise without recurrence of BPPV Baseline:  Goal status: MET   PLAN:  PT FREQUENCY: 2x/week  PT  DURATION: 12 weeks  PLANNED INTERVENTIONS: 97164- PT Re-evaluation, 97110-Therapeutic exercises, 97530- Therapeutic activity, 97112- Neuromuscular re-education, 97535- Self Care, 16109- Manual therapy, [id number]- Gait training, [phone]- Aquatic Therapy, 97014- Electrical stimulation (unattended), 97035- Ultrasound, 09811- Ionotophoresis 4mg /ml Dexamethasone, Patient/Family education, Balance training, Stair training, Taping, Dry Needling, Joint mobilization,  Spinal mobilization, Cryotherapy, and Moist heat.  PLAN FOR NEXT SESSION: Focus on progress knee AROM; core strengthening & balance.     Sanjuana Mae, PTA 02/05/2023, 12:40 PM

## 2023-02-10 ENCOUNTER — Ambulatory Visit: Payer: Medicare Other | Admitting: Rehabilitative and Restorative Service Providers"

## 2023-02-12 ENCOUNTER — Ambulatory Visit: Payer: Medicare Other

## 2023-02-12 DIAGNOSIS — R2689 Other abnormalities of gait and mobility: Secondary | ICD-10-CM

## 2023-02-12 DIAGNOSIS — M6281 Muscle weakness (generalized): Secondary | ICD-10-CM

## 2023-02-12 DIAGNOSIS — R29898 Other symptoms and signs involving the musculoskeletal system: Secondary | ICD-10-CM

## 2023-02-12 DIAGNOSIS — M5459 Other low back pain: Secondary | ICD-10-CM

## 2023-02-12 DIAGNOSIS — M170 Bilateral primary osteoarthritis of knee: Secondary | ICD-10-CM

## 2023-02-12 DIAGNOSIS — R262 Difficulty in walking, not elsewhere classified: Secondary | ICD-10-CM

## 2023-02-12 NOTE — Therapy (Signed)
OUTPATIENT PHYSICAL THERAPY THORACOLUMBAR TREATMENT   Patient Name: Sally [name] MRN: [id number] DOB:03/27/1945, 77 y.o., female Today's Date: 02/12/2023  END OF SESSION:  PT End of Session - 02/12/23 1147     Visit Number 11    Number of Visits 24    Date for PT Re-Evaluation 03/25/23    Authorization Type medicare A/B; BCBS federal secondary    Progress Note Due on Visit 20    PT Start Time 1148    PT Stop Time 1229    PT Time Calculation (min) 41 min    Activity Tolerance Patient tolerated treatment well    Behavior During Therapy WFL for tasks assessed/performed             Past Medical History:  Diagnosis Date   Allergy    Arthritis    "all over"   Chronic pain syndrome    Chronic UTI    Fibromyalgia    Hx of colonic polyps    Hypertension    Hypothyroidism    Obesity    Pre-diabetes    "barely" (12/22/2013)   Past Surgical History:  Procedure Laterality Date   ABDOMINAL HYSTERECTOMY  1990   APPENDECTOMY  1965   CESAREAN SECTION  1980; 1985   CHOLECYSTECTOMY  1982   DILATION AND CURETTAGE OF UTERUS  1990   FOOT SURGERY  1992   KNEE ARTHROSCOPY Right 1992   TONSILLECTOMY  1959   TOTAL SHOULDER ARTHROPLASTY Left 11/25/2012   Procedure: TOTAL SHOULDER ARTHROPLASTY;  Surgeon: Mable Paris, MD;  Location: [facility] OR;  Service: Orthopedics;  Laterality: Left;  LEFT TOTAL SHOULDER ARTHROPLASTY   TOTAL SHOULDER ARTHROPLASTY Right 12/22/2013   TOTAL SHOULDER ARTHROPLASTY Right 12/22/2013   Procedure: TOTAL SHOULDER ARTHROPLASTY;  Surgeon: Mable Paris, MD;  Location: [facility] OR;  Service: Orthopedics;  Laterality: Right;  Right shoulder total arthroplasty   TUBAL LIGATION  1985   Patient Active Problem List   Diagnosis Date Noted   Visceral obesity 01/13/2022   Other fatigue 01/13/2022   Other specified hypothyroidism 01/13/2022   Primary osteoarthritis involving multiple joints 01/13/2022   Class 1 obesity with serious comorbidity and body  mass index (BMI) of 32.0 to 32.9 in adult 01/13/2022   Arthritis of shoulder region, right, degenerative 12/22/2013   Osteoarthritis of right shoulder 08/09/2013   Right tennis elbow 08/09/2013   Status post total shoulder replacement 11/26/2012   Fibromyalgia 08/15/2010   Chronic pain 08/15/2010   HYPOTHYROIDISM 05/15/2009   HYPERTENSION 05/15/2009   ALLERGIC RHINITIS 05/15/2009   DYSURIA 05/15/2009   History of colonic polyps 05/15/2009    PCP: Dr Dorthey Sawyer   REFERRING PROVIDER: Dr Dorthey Sawyer   REFERRING DIAG: Wedge compression fracture L5  Rationale for Evaluation and Treatment: Rehabilitation  THERAPY DIAG:  Muscle weakness (generalized)  Other symptoms and signs involving the musculoskeletal system  Other low back pain  Osteoarthritis of both knees, unspecified osteoarthritis type  Difficulty in walking, not elsewhere classified  Other abnormalities of gait and mobility  ONSET DATE: 10/02/22  SUBJECTIVE:  SUBJECTIVE STATEMENT: Patient reports she is feeling okay to day, states her R knee still bothers her.  PERTINENT HISTORY:  Bilat shoulder replacements; bilat OA knees; obesity  PAIN:  Are you having pain? Yes: NPRS scale: 4/10 Pain location: low back to tailbone  Pain description: mostly sore; aching; sharp at times  Aggravating factors: sitting on hard surface; driving  Relieving factors: rest; sometimes heat or ice   PRECAUTIONS: None   WEIGHT BEARING RESTRICTIONS: No  FALLS:  Has patient fallen in last 6 months? Yes. 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  DIAGNOSTIC FINDINGS:  None available   PATIENT SURVEYS:  FOTO 49; goal 64  MUSCLE LENGTH: Hamstrings: Right 60  deg; Left 65 deg Tight hip flexors bilat R > L   POSTURE: rounded shoulders, forward head, decreased lumbar lordosis, increased thoracic kyphosis, flexed trunk , and upper body shifted to L; knees flexed    PALPATION: Tenderness and tightness R > l lateral sacral border into coccyx   LUMBAR ROM:   AROM eval  Flexion 90%  Extension 40%  Right lateral flexion 70%  Left lateral flexion 65%  Right rotation 40%  Left rotation 45%    (Blank rows = not tested)  LOWER EXTREMITY ROM:  bilat knee flexion and extension limited R > L    *measure knee flexion and extension at next visit   Active  Right eval Left eval Right 11/1 Left 11/1 Right 12/3 Left 12/3 Right 12/5 Left 12/5   Hip flexion   107 110 112 110 112 115  Hip extension   -7 -3 -7 -2 -6 -2  Hip abduction          Hip adduction          Hip internal rotation          Hip external rotation          Knee flexion          Knee extension          Ankle dorsiflexion          Ankle plantarflexion          Ankle inversion          Ankle eversion           (Blank rows = not tested)  LOWER EXTREMITY MMT:    MMT Right eval Left eval Right 12/5 Left 12/5  Hip flexion 4 5 4+ 4+  Hip extension 3 3+ 4+ 4+  Hip abduction 3 4- 4+ 4+  Hip adduction      Hip internal rotation      Hip external rotation      Knee flexion      Knee extension 4 4 4+ 4+  Ankle dorsiflexion      Ankle plantarflexion      Ankle inversion       Ankle eversion       (Blank  rows = not tested)  LUMBAR SPECIAL TESTS:  Straight leg raise test: Negative and Slump test: Negative  FUNCTIONAL TESTS:  5 times sit to stand: 11.10 sec  SLS L 5 sec; R 2 sec   Berg balance score 48     GAIT: Distance walked: 40 feet  Assistive device utilized: None Level of assistance: Complete Independence Comments: flexed forward posture    [facility] Adult PT Treatment:                                                DATE: 02/12/2023 Therapeutic Exercise: Walking + head turns/nods  Walking + EO/EC every 3 steps NuStep L5 x Prone:  Straight leg hip ext 2x12 (B) Bent knee hip ext 2x12 (B) LAQ + 5#AW 2x10 (B) Standing TKE + blue TB Prone passive knee extension stretch --> LLLD 2#AW on R LE    [facility] Adult PT Treatment:                                                DATE: 02/05/2023 Therapeutic Exercise: NuStep L6 x Updating LTGs Prone:  Quad set 12x5" (B) Straight leg hip ext x10 (B) Bent knee hip ext x10 (B) SAQ + 5#AW 2x10x5" (B)   [facility] Adult PT Treatment:                                                DATE: 02/03/2023 Therapeutic Exercise: NuStep L6 x Knee measurements (see above) Supine quad set  10x5" (B) SLR 10x5" (B) SAQ (green bolster) + 5#AW 2x10x5" Seated knee flexion stretch 10x10" (B)   PATIENT EDUCATION:  Education details: Updated HEP  Person educated: Patient Education method: Explanation, Demonstration, Tactile cues, Verbal cues, and Handouts Education comprehension: verbalized understanding, returned demonstration, verbal cues required, tactile cues required, and needs further education  HOME EXERCISE PROGRAM:  Access Code: [facility] URL: [email or web address] Date: 02/12/2023 Prepared by: Carlynn Herald  Exercises - Single Leg Stance with Support  - 2 x daily - 7 x weekly - 1 sets - 3-5 reps - 10-20 sec  hold - Tandem Stance with Support  - 2 x daily - 7 x weekly - 1 sets - 3-5 reps  - 10-20 sec  hold - Single Leg Stretch  - 1 x daily - 7 x weekly - 3 sets - 10 reps - Double Leg Stretch Diagonal  - 1 x daily - 7 x weekly - 3 sets - 10 reps - Supine Leg Scissors  - 1 x daily - 7 x weekly - 3 sets - 10 reps - Bridge with Hip Abduction and Resistance  - 1 x daily - 7 x weekly - 3 sets - 10 reps - Deadlift with Resistance  - 1 x daily - 7 x weekly - 3 sets - 10 reps - Supine Lower Trunk Rotation  - 1 x daily - 7 x weekly - 3 sets - 10 reps - Sidelying Thoracic Rotation with Open Book  - 1 x daily - 7 x weekly - 3 sets - 10 reps - Seated Hamstring  Stretch  - 1 x daily - 7 x weekly - 1 sets - 3-5 reps - 10-30 sec hold - Sit to Stand  - 1 x daily - 7 x weekly - 3 sets - 10 reps - Seated Knee Flexion Stretch  - 1 x daily - 7 x weekly - 1-2 sets - 10 reps - 10 sec hold - Seated Knee Extension Stretch with Chair  - 1 x daily - 7 x weekly - 1 sets - 1-3 reps - 1-2 min hold - Prone Knee Extension Hang  - 1 x daily - 7 x weekly - 1 sets - 1 reps - 2-5 min hold - Prone Knee Extension with Ankle Weight  - 1 x daily - 7 x weekly - 1 sets - 1 reps - 1-3 min hold  ASSESSMENT:  CLINICAL IMPRESSION: Low load long duration added in prone knee extension to progress R knee total extension; patient tolerated well with no exacerbation of pain or strain.  VEST: Session today focused on addressing pt c/o dizziness. Pt positive for Rt BPPV. Pt with good response to Epley maneuver with 90% decrease in dizziness after 2 maneuvers. Pt give Semont maneuver for HEP as she states she has difficulty performing Epley at home. Pt very pleased with results of session and states she feels like her dizziness is "all gone".  EVAL: Patient is a 77 y.o. female who was seen today for physical therapy evaluation and treatment for wedge fracture L5 lumbar spine. She reports resolving pain primarily in the R sacral and coccyx area. She is most concerned with her balance and walking. She does not feel comfortable and  secure with walking. Patient has history of bilat knee arthritis and vestibular dysfunction. She presents with poor posture and alignment; limited LE and trunk mobility/ROM; LE weakness; decreased stability with higher level balance activities; report of some dizziness with rolling; coccyx/sacral pain R>L. Patient will benefit from PT to address problems identified.    GOALS: Goals reviewed with patient? Yes  SHORT TERM GOALS: Target date: 02/10/2023  Independent initial HEP  Baseline: Goal status: MET  2.  Further assess higher level balance and vestibular disorder  Baseline:  Goal status: MET   LONG TERM GOALS: Target date: 03/24/2023  Improve posture and alignment with patient to demonstrate more upright posture with increased hip and knee extension in standing  Baseline:  Goal status: IN PROGRESS  2.  Increased LE strength to 4+/5 to 5/5  Baseline:  Goal status: MET  3.  Increase knee extension  Baseline: see above Goal status: IN PROGRESS  4.  Patient reports no increased pain with sitting and/or driving > 1 hour  Baseline: no pain  Goal status: MET  5.  Independent in HEP, including aquatic program as indicated  Baseline:  Goal status: MET  6.  Improve functional limitation score to 64 Baseline: 75 Goal status: MET  7.  Pt will perform ADLs, IADLs and therapeutic exercise without recurrence of BPPV Baseline:  Goal status: MET   PLAN:  PT FREQUENCY: 2x/week  PT DURATION: 12 weeks  PLANNED INTERVENTIONS: 97164- PT Re-evaluation, 97110-Therapeutic exercises, 97530- Therapeutic activity, 97112- Neuromuscular re-education, 97535- Self Care, 16109- Manual therapy, [id number]- Gait training, [phone]- Aquatic Therapy, 97014- Electrical stimulation (unattended), 97035- Ultrasound, 09811- Ionotophoresis 4mg /ml Dexamethasone, Patient/Family education, Balance training, Stair training, Taping, Dry Needling, Joint mobilization, Spinal mobilization, Cryotherapy, and Moist  heat.  PLAN FOR NEXT SESSION: Focus on progress knee AROM; core strengthening & balance.  Sanjuana Mae, PTA 02/12/2023, 12:29 PM

## 2023-02-15 NOTE — Therapy (Addendum)
OUTPATIENT PHYSICAL THERAPY THORACOLUMBAR TREATMENT AND DISCHARGE SUMMARY   Patient Name: Sally [name] MRN: [id number] DOB:November 22, 1945, 77 y.o., female Today's Date: 02/16/2023  END OF SESSION:  PT End of Session - 02/16/23 1323     Visit Number 12    Number of Visits 24    Date for PT Re-Evaluation 03/25/23    Authorization Type medicare A/B; BCBS federal secondary    Authorization - Visit Number 8    Progress Note Due on Visit 20    PT Start Time 1323    PT Stop Time 1347    PT Time Calculation (min) 24 min    Activity Tolerance Patient tolerated treatment well    Behavior During Therapy WFL for tasks assessed/performed              Past Medical History:  Diagnosis Date   Allergy    Arthritis    "all over"   Chronic pain syndrome    Chronic UTI    Fibromyalgia    Hx of colonic polyps    Hypertension    Hypothyroidism    Obesity    Pre-diabetes    "barely" (12/22/2013)   Past Surgical History:  Procedure Laterality Date   ABDOMINAL HYSTERECTOMY  1990   APPENDECTOMY  1965   CESAREAN SECTION  1980; 1985   CHOLECYSTECTOMY  1982   DILATION AND CURETTAGE OF UTERUS  1990   FOOT SURGERY  1992   KNEE ARTHROSCOPY Right 1992   TONSILLECTOMY  1959   TOTAL SHOULDER ARTHROPLASTY Left 11/25/2012   Procedure: TOTAL SHOULDER ARTHROPLASTY;  Surgeon: Mable Paris, MD;  Location: [facility] OR;  Service: Orthopedics;  Laterality: Left;  LEFT TOTAL SHOULDER ARTHROPLASTY   TOTAL SHOULDER ARTHROPLASTY Right 12/22/2013   TOTAL SHOULDER ARTHROPLASTY Right 12/22/2013   Procedure: TOTAL SHOULDER ARTHROPLASTY;  Surgeon: Mable Paris, MD;  Location: [facility] OR;  Service: Orthopedics;  Laterality: Right;  Right shoulder total arthroplasty   TUBAL LIGATION  1985   Patient Active Problem List   Diagnosis Date Noted   Visceral obesity 01/13/2022   Other fatigue 01/13/2022   Other specified hypothyroidism 01/13/2022   Primary osteoarthritis involving multiple joints  01/13/2022   Class 1 obesity with serious comorbidity and body mass index (BMI) of 32.0 to 32.9 in adult 01/13/2022   Arthritis of shoulder region, right, degenerative 12/22/2013   Osteoarthritis of right shoulder 08/09/2013   Right tennis elbow 08/09/2013   Status post total shoulder replacement 11/26/2012   Fibromyalgia 08/15/2010   Chronic pain 08/15/2010   HYPOTHYROIDISM 05/15/2009   HYPERTENSION 05/15/2009   ALLERGIC RHINITIS 05/15/2009   DYSURIA 05/15/2009   History of colonic polyps 05/15/2009    PCP: Dr Dorthey Sawyer   REFERRING PROVIDER: Dr Dorthey Sawyer   REFERRING DIAG: Wedge compression fracture L5  Rationale for Evaluation and Treatment: Rehabilitation  THERAPY DIAG:  Muscle weakness (generalized)  Other symptoms and signs involving the musculoskeletal system  Other low back pain  Osteoarthritis of both knees, unspecified osteoarthritis type  Difficulty in walking, not elsewhere classified  Other abnormalities of gait and mobility  ONSET DATE: 10/02/22  SUBJECTIVE:  SUBJECTIVE STATEMENT: It bothered me a lot yesterday. Leaving town tomorrow for two weeks. No pain today.  PERTINENT HISTORY:  Bilat shoulder replacements; bilat OA knees; obesity  PAIN:  Are you having pain? Yes: NPRS scale: 4/10 Pain location: low back to tailbone  Pain description: mostly sore; aching; sharp at times  Aggravating factors: sitting on hard surface; driving  Relieving factors: rest; sometimes heat or ice   PRECAUTIONS: None   WEIGHT BEARING RESTRICTIONS: No  FALLS:  Has patient fallen in last 6 months? Yes. 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  DIAGNOSTIC FINDINGS:  None available   PATIENT SURVEYS:  FOTO 49; goal 64  MUSCLE LENGTH: Hamstrings: Right 60  deg; Left 65 deg Tight hip flexors bilat R > L   POSTURE: rounded shoulders, forward head, decreased lumbar lordosis, increased thoracic kyphosis, flexed trunk , and upper body shifted to L; knees flexed    PALPATION: Tenderness and tightness R > l lateral sacral border into coccyx   LUMBAR ROM:   AROM eval  Flexion 90%  Extension 40%  Right lateral flexion 70%  Left lateral flexion 65%  Right rotation 40%  Left rotation 45%    (Blank rows = not tested)  LOWER EXTREMITY ROM:  bilat knee flexion and extension limited R > L    *measure knee flexion and extension at next visit   Active  Right eval Left eval Right 11/1 Left 11/1 Right 12/3 Left 12/3 Right 12/5 Left 12/5   Hip flexion   107 110 112 110 112 115  Hip extension   -7 -3 -7 -2 -6 -2  Hip abduction          Hip adduction          Hip internal rotation          Hip external rotation          Knee flexion          Knee extension          Ankle dorsiflexion          Ankle plantarflexion          Ankle inversion          Ankle eversion           (Blank rows = not tested)  LOWER EXTREMITY MMT:    MMT Right eval Left eval Right 12/5 Left 12/5  Hip flexion 4 5 4+ 4+  Hip extension 3 3+ 4+ 4+  Hip abduction 3 4- 4+ 4+  Hip adduction      Hip internal rotation      Hip external rotation      Knee flexion      Knee extension 4 4 4+ 4+  Ankle  dorsiflexion      Ankle plantarflexion      Ankle inversion      Ankle eversion       (Blank  rows = not tested)  LUMBAR SPECIAL TESTS:  Straight leg raise test: Negative and Slump test: Negative  FUNCTIONAL TESTS:  5 times sit to stand: 11.10 sec  SLS L 5 sec; R 2 sec   Berg balance score 48     GAIT: Distance walked: 40 feet  Assistive device utilized: None Level of assistance: Complete Independence Comments: flexed forward posture    [facility] Adult PT Treatment:                                                DATE: 02/16/2023 Therapeutic Exercise: Walking + head turns/nods  Walking + EO/EC every 3 steps Prone:  Straight leg hip ext 2x12 (B) Bent knee hip ext 2x12 (B) LAQ + 5#AW 2x12 (B) Standing TKE + blue TB x 20 B Prone passive knee extension stretch --> LLLD 5#AW on R LE x 2 min    [facility] Adult PT Treatment:                                                DATE: 02/12/2023 Therapeutic Exercise: Walking + head turns/nods  Walking + EO/EC every 3 steps NuStep L5 x Prone:  Straight leg hip ext 2x12 (B) Bent knee hip ext 2x12 (B) LAQ + 5#AW 2x10 (B) Standing TKE + blue TB Prone passive knee extension stretch --> LLLD 2#AW on R LE    [facility] Adult PT Treatment:                                                DATE: 02/05/2023 Therapeutic Exercise: NuStep L6 x Updating LTGs Prone:  Quad set 12x5" (B) Straight leg hip ext x10 (B) Bent knee hip ext x10 (B) SAQ + 5#AW 2x10x5" (B)   [facility] Adult PT Treatment:                                                DATE: 02/03/2023 Therapeutic Exercise: NuStep L6 x Knee measurements (see above) Supine quad set  10x5" (B) SLR 10x5" (B) SAQ (green bolster) + 5#AW 2x10x5" Seated knee flexion stretch 10x10" (B)   PATIENT EDUCATION:  Education details: Updated HEP  Person educated: Patient Education method: Explanation, Demonstration, Tactile cues, Verbal cues, and Handouts Education comprehension: verbalized  understanding, returned demonstration, verbal cues required, tactile cues required, and needs further education  HOME EXERCISE PROGRAM:  Access Code: [facility] URL: [email or web address] Date: 02/12/2023 Prepared by: Carlynn Herald  Exercises - Single Leg Stance with Support  - 2 x daily - 7 x weekly - 1 sets - 3-5 reps - 10-20 sec  hold - Tandem Stance with Support  - 2 x daily - 7 x weekly - 1 sets - 3-5 reps - 10-20 sec  hold - Single Leg Stretch  - 1 x daily - 7 x weekly - 3 sets - 10 reps - Double Leg Stretch Diagonal  - 1 x daily - 7 x weekly -  3 sets - 10 reps - Supine Leg Scissors  - 1 x daily - 7 x weekly - 3 sets - 10 reps - Bridge with Hip Abduction and Resistance  - 1 x daily - 7 x weekly - 3 sets - 10 reps - Deadlift with Resistance  - 1 x daily - 7 x weekly - 3 sets - 10 reps - Supine Lower Trunk Rotation  - 1 x daily - 7 x weekly - 3 sets - 10 reps - Sidelying Thoracic Rotation with Open Book  - 1 x daily - 7 x weekly - 3 sets - 10 reps - Seated Hamstring Stretch  - 1 x daily - 7 x weekly - 1 sets - 3-5 reps - 10-30 sec hold - Sit to Stand  - 1 x daily - 7 x weekly - 3 sets - 10 reps - Seated Knee Flexion Stretch  - 1 x daily - 7 x weekly - 1-2 sets - 10 reps - 10 sec hold - Seated Knee Extension Stretch with Chair  - 1 x daily - 7 x weekly - 1 sets - 1-3 reps - 1-2 min hold - Prone Knee Extension Hang  - 1 x daily - 7 x weekly - 1 sets - 1 reps - 2-5 min hold - Prone Knee Extension with Ankle Weight  - 1 x daily - 7 x weekly - 1 sets - 1 reps - 1-3 min hold  ASSESSMENT:  CLINICAL IMPRESSION: Tamora reports no pain today. She did well with gait activities today with no LOB with EO/EC. Head turns are still challenging to vestibular system. Sherlon is going to [place] for two weeks tomorrow. She has met all of her LTGs except knee extension and should be ready for discharge at her next visit.  VEST: Session today focused on addressing pt c/o dizziness. Pt positive  for Rt BPPV. Pt with good response to Epley maneuver with 90% decrease in dizziness after 2 maneuvers. Pt give Semont maneuver for HEP as she states she has difficulty performing Epley at home. Pt very pleased with results of session and states she feels like her dizziness is "all gone".  EVAL: Patient is a 77 y.o. female who was seen today for physical therapy evaluation and treatment for wedge fracture L5 lumbar spine. She reports resolving pain primarily in the R sacral and coccyx area. She is most concerned with her balance and walking. She does not feel comfortable and secure with walking. Patient has history of bilat knee arthritis and vestibular dysfunction. She presents with poor posture and alignment; limited LE and trunk mobility/ROM; LE weakness; decreased stability with higher level balance activities; report of some dizziness with rolling; coccyx/sacral pain R>L. Patient will benefit from PT to address problems identified.    GOALS: Goals reviewed with patient? Yes  SHORT TERM GOALS: Target date: 02/10/2023  Independent initial HEP  Baseline: Goal status: MET  2.  Further assess higher level balance and vestibular disorder  Baseline:  Goal status: MET   LONG TERM GOALS: Target date: 03/24/2023  Improve posture and alignment with patient to demonstrate more upright posture with increased hip and knee extension in standing  Baseline:  Goal status: IN PROGRESS  2.  Increased LE strength to 4+/5 to 5/5  Baseline:  Goal status: MET  3.  Increase knee extension  Baseline: see above Goal status: IN PROGRESS  4.  Patient reports no increased pain with sitting and/or driving > 1 hour  Baseline: no  pain  Goal status: MET  5.  Independent in HEP, including aquatic program as indicated  Baseline:  Goal status: MET  6.  Improve functional limitation score to 64 Baseline: 75 Goal status: MET  7.  Pt will perform ADLs, IADLs and therapeutic exercise without recurrence of  BPPV Baseline:  Goal status: MET   PLAN:  PT FREQUENCY: 2x/week  PT DURATION: 12 weeks  PLANNED INTERVENTIONS: 97164- PT Re-evaluation, 97110-Therapeutic exercises, 97530- Therapeutic activity, 97112- Neuromuscular re-education, 97535- Self Care, 45409- Manual therapy, [id number]- Gait training, [phone]- Aquatic Therapy, 97014- Electrical stimulation (unattended), 97035- Ultrasound, 47829- Ionotophoresis 4mg /ml Dexamethasone, Patient/Family education, Balance training, Stair training, Taping, Dry Needling, Joint mobilization, Spinal mobilization, Cryotherapy, and Moist heat.  PLAN FOR NEXT SESSION: Finalize HEP and Discharge    Solon Palm, PT  02/16/2023, 1:50 PM   PHYSICAL THERAPY DISCHARGE SUMMARY  Visits from Start of Care: 12  Current functional level related to goals / functional outcomes: See above   Remaining deficits: See above   Education / Equipment: HEP   Patient agrees to discharge. Patient goals were met. Patient is being discharged due to meeting the stated rehab goals.  Solon Palm, PT 03/24/23 9:30 AM;  [facility] [address] [place], [place] [address]  [phone] (office) [phone] (fax)

## 2023-02-16 ENCOUNTER — Encounter: Payer: Self-pay | Admitting: Physical Therapy

## 2023-02-16 ENCOUNTER — Ambulatory Visit: Payer: Medicare Other | Admitting: Physical Therapy

## 2023-02-16 DIAGNOSIS — M5459 Other low back pain: Secondary | ICD-10-CM

## 2023-02-16 DIAGNOSIS — R29898 Other symptoms and signs involving the musculoskeletal system: Secondary | ICD-10-CM

## 2023-02-16 DIAGNOSIS — R262 Difficulty in walking, not elsewhere classified: Secondary | ICD-10-CM

## 2023-02-16 DIAGNOSIS — M170 Bilateral primary osteoarthritis of knee: Secondary | ICD-10-CM

## 2023-02-16 DIAGNOSIS — M6281 Muscle weakness (generalized): Secondary | ICD-10-CM

## 2023-02-16 DIAGNOSIS — R2689 Other abnormalities of gait and mobility: Secondary | ICD-10-CM

## 2023-03-03 ENCOUNTER — Ambulatory Visit: Payer: Medicare Other | Admitting: Physical Therapy

## 2023-06-11 ENCOUNTER — Ambulatory Visit
Admission: EM | Admit: 2023-06-11 | Discharge: 2023-06-11 | Disposition: A | Discharge status: Home / Self Care | Attending: Family Medicine | Admitting: Family Medicine

## 2023-06-11 DIAGNOSIS — N3000 Acute cystitis without hematuria: Secondary | ICD-10-CM | POA: Diagnosis not present

## 2023-06-11 LAB — POCT URINALYSIS DIP (MANUAL ENTRY)
Bilirubin, UA: NEGATIVE
Blood, UA: NEGATIVE
Glucose, UA: NEGATIVE mg/dL
Ketones, POC UA: NEGATIVE mg/dL
Nitrite, UA: POSITIVE — AB
Protein Ur, POC: NEGATIVE mg/dL
Spec Grav, UA: 1.02 (ref 1.010–1.025)
Urobilinogen, UA: 1 U/dL
pH, UA: 7 (ref 5.0–8.0)

## 2023-06-11 MED ORDER — CEFDINIR 300 MG PO CAPS: 300.0000 mg | ORAL_CAPSULE | Freq: Two times a day (BID) | ORAL | 0 refills | Status: AC

## 2023-06-11 NOTE — Discharge Instructions (Addendum)
 Advised patient to take medication as directed with food to completion.  Encouraged increase daily water intake to 64 ounces per day while taking this medication.  Advised we will follow-up with your urine culture results once received.

## 2023-06-11 NOTE — ED Triage Notes (Signed)
 Pt c/o dysuria and frequency x 3 days. AZO prn.

## 2023-06-11 NOTE — ED Provider Notes (Signed)
 [facility]    CSN: [id number] Arrival date & time: 06/11/23  1513      History   Chief Complaint Chief Complaint  Patient presents with   Dysuria   Urinary Frequency    HPI PERRIE RAGIN is a 78 y.o. female.   HPI pleasant 78 year old female presents with dysuria and frequency for 3 days.  Reports using AZO as needed.  PMH significant for chronic pain, fibromyalgia, and visceral obesity.  Past Medical History:  Diagnosis Date   Allergy    Arthritis    "all over"   Chronic pain syndrome    Chronic UTI    Fibromyalgia    Hx of colonic polyps    Hypertension    Hypothyroidism    Obesity    Pre-diabetes    "barely" (12/22/2013)    Patient Active Problem List   Diagnosis Date Noted   Visceral obesity 01/13/2022   Other fatigue 01/13/2022   Other specified hypothyroidism 01/13/2022   Primary osteoarthritis involving multiple joints 01/13/2022   Class 1 obesity with serious comorbidity and body mass index (BMI) of 32.0 to 32.9 in adult 01/13/2022   Arthritis of shoulder region, right, degenerative 12/22/2013   Osteoarthritis of right shoulder 08/09/2013   Right tennis elbow 08/09/2013   Status post total shoulder replacement 11/26/2012   Fibromyalgia 08/15/2010   Chronic pain 08/15/2010   HYPOTHYROIDISM 05/15/2009   HYPERTENSION 05/15/2009   ALLERGIC RHINITIS 05/15/2009   DYSURIA 05/15/2009   History of colonic polyps 05/15/2009    Past Surgical History:  Procedure Laterality Date   ABDOMINAL HYSTERECTOMY  1990   APPENDECTOMY  1965   CESAREAN SECTION  1980; 1985   CHOLECYSTECTOMY  1982   DILATION AND CURETTAGE OF UTERUS  1990   FOOT SURGERY  1992   KNEE ARTHROSCOPY Right 1992   TONSILLECTOMY  1959   TOTAL SHOULDER ARTHROPLASTY Left 11/25/2012   Procedure: TOTAL SHOULDER ARTHROPLASTY;  Surgeon: Mable Paris, MD;  Location: [facility];  Service: Orthopedics;  Laterality: Left;  LEFT TOTAL SHOULDER ARTHROPLASTY   TOTAL SHOULDER ARTHROPLASTY  Right 12/22/2013   TOTAL SHOULDER ARTHROPLASTY Right 12/22/2013   Procedure: TOTAL SHOULDER ARTHROPLASTY;  Surgeon: Mable Paris, MD;  Location: [facility] OR;  Service: Orthopedics;  Laterality: Right;  Right shoulder total arthroplasty   TUBAL LIGATION  1985    OB History     Gravida  7   Para  6   Term  6   Preterm      AB      Living  6      SAB      IAB      Ectopic      Multiple      Live Births               Home Medications    Prior to Admission medications   Medication Sig Start Date End Date Taking? Authorizing Provider  cefdinir (OMNICEF) 300 MG capsule Take 1 capsule (300 mg total) by mouth 2 (two) times daily for 7 days. 06/11/23 06/18/23 Yes Trevor Iha, FNP  Ascorbic Acid (VITAMIN C PO) Take 1 tablet by mouth daily.    [provider]  B Complex Vitamins (VITAMIN-B COMPLEX PO) Take 1 tablet by mouth daily.    [provider]  BIOTIN 5000 PO Take 5,000 Units by mouth 2 (two) times a week.     [provider]  Cholecalciferol (VITAMIN D) 125 MCG (5000 UT) CAPS Take  by mouth. Patient not taking: Reported on 12/31/2022    [provider]  Diethylpropion HCl 25 MG TABS Take 75 mg by mouth daily. Patient not taking: Reported on 12/31/2022    [provider]  fluticasone (FLONASE) 50 MCG/ACT nasal spray Place 2 sprays into both nostrils daily. Patient not taking: Reported on 12/31/2022 08/22/15   Lurene Shadow, PA-C  Magnesium 250 MG TABS Take 250 mg by mouth 2 (two) times daily.    [provider]  Multiple Vitamins-Minerals (MULTIVITAMIN PO) Take 1 tablet by mouth daily. Patient not taking: Reported on 12/31/2022    [provider]  OVER THE COUNTER MEDICATION Take 50 mg by mouth 2 (two) times a week. DHEA 50 mg Patient not taking: Reported on 12/31/2022    [provider]  OxyCODONE (OXYCONTIN) 20 mg T12A 12 hr tablet Take 1 tablet (20 mg total) by mouth every 8 (eight)  hours. Patient not taking: Reported on 12/31/2022 12/23/13   Jiles Harold, PA-C  oxyCODONE-acetaminophen (PERCOCET) 10-325 MG per tablet Take 1 tablet by mouth 3 (three) times daily.     [provider]  phenazopyridine (PYRIDIUM) 200 MG tablet Take 1 tablet by mouth every 6-8 hours if needed for urinary pain 07/29/14   Lajean Manes, MD  phentermine 15 MG capsule Take 15 mg by mouth 2 (two) times daily.    [provider]  telmisartan (MICARDIS) 40 MG tablet Take 20 mg by mouth daily.    [provider]  thyroid (ARMOUR) 60 MG tablet Take 120 mg by mouth daily before breakfast.    [provider]    Family History Family History  Problem Relation Age of Onset   Cancer Mother 30       pancratic   Cancer Father 24       hematologic malignancy    Arthritis Sister    Arthritis Sister    Hypertension Unknown        Family history   Thyroid disease Unknown        family history    Social History Social History   Tobacco Use   Smoking status: Former    Current packs/day: 0.00    Average packs/day: 1 pack/day for 7.0 years (7.0 ttl pk-yrs)    Types: Cigarettes    Start date: 03/04/1963    Quit date: 03/03/1970    Years since quitting: 53.3   Smokeless tobacco: Never  Vaping Use   Vaping status: Never Used  Substance Use Topics   Alcohol use: No   Drug use: No     Allergies   Codeine and Doxycycline   Review of Systems Review of Systems  Genitourinary:  Positive for dysuria and frequency.  All other systems reviewed and are negative.    Physical Exam Triage Vital Signs ED Triage Vitals [06/11/23 1529]  Encounter Vitals Group     BP      Systolic BP Percentile      Diastolic BP Percentile      Pulse      Resp      Temp      Temp src      SpO2      Weight      Height      Head Circumference      Peak Flow      Pain Score 1     Pain Loc      Pain Education      Exclude from Growth Chart  No data found.  Updated  Vital Signs BP (!) 184/103 (BP Location: Right Arm)   Pulse 83   Temp 98.4 F (36.9 C) (Oral)   Resp 18   SpO2 97%   Physical Exam Vitals and nursing note reviewed.  Constitutional:      Appearance: Normal appearance. She is obese.  HENT:     Head: Normocephalic and atraumatic.     Mouth/Throat:     Mouth: Mucous membranes are moist.     Pharynx: Oropharynx is clear.  Eyes:     Extraocular Movements: Extraocular movements intact.     Conjunctiva/sclera: Conjunctivae normal.     Pupils: Pupils are equal, round, and reactive to light.  Cardiovascular:     Rate and Rhythm: Normal rate and regular rhythm.     Pulses: Normal pulses.     Heart sounds: Normal heart sounds.  Pulmonary:     Effort: Pulmonary effort is normal.     Breath sounds: Normal breath sounds. No wheezing, rhonchi or rales.  Musculoskeletal:        General: Normal range of motion.     Cervical back: Normal range of motion.  Skin:    General: Skin is warm and dry.  Neurological:     General: No focal deficit present.     Mental Status: She is alert and oriented to person, place, and time. Mental status is at baseline.      UC Treatments / Results  Labs (all labs ordered are listed, but only abnormal results are displayed) Labs Reviewed  POCT URINALYSIS DIP (MANUAL ENTRY) - Abnormal; Notable for the following components:      Result Value   Clarity, UA cloudy (*)    Nitrite, UA Positive (*)    Leukocytes, UA Small (1+) (*)    All other components within normal limits  URINE CULTURE    EKG   Radiology No results found.  Procedures Procedures (including critical care time)  Medications Ordered in UC Medications - No data to display  Initial Impression / Assessment and Plan / UC Course  I have reviewed the triage vital signs and the nursing notes.  Pertinent labs & imaging results that were available during my care of the patient were reviewed by me and considered in my medical decision  making (see chart for details).     MDM: 1.  Acute cystitis without hematuria-UA revealed above, urine culture ordered, Rx'd cefdinir 300 mg capsule: Take 1 capsule twice daily x 7 days. Advised patient to take medication as directed with food to completion.  Encouraged increase daily water intake to 64 ounces per day while taking this medication.  Advised we will follow-up with your urine culture results once received.  Patient discharged home, hemodynamically stable. Final Clinical Impressions(s) / UC Diagnoses   Final diagnoses:  Acute cystitis without hematuria     Discharge Instructions      Advised patient to take medication as directed with food to completion.  Encouraged increase daily water intake to 64 ounces per day while taking this medication.  Advised we will follow-up with your urine culture results once received.     ED Prescriptions     Medication Sig Dispense Auth. Provider   cefdinir (OMNICEF) 300 MG capsule Take 1 capsule (300 mg total) by mouth 2 (two) times daily for 7 days. 14 capsule Trevor Iha, FNP      PDMP not reviewed this encounter.   Trevor Iha, FNP 06/11/23 1623

## 2023-06-11 NOTE — ED Notes (Signed)
Provider notified of pt's elevated BP 

## 2023-06-13 LAB — URINE CULTURE: Culture: >=100000 — AB

## 2023-06-15 ENCOUNTER — Telehealth: Payer: Self-pay

## 2023-12-20 NOTE — Therapy (Unsigned)
 OUTPATIENT PHYSICAL THERAPY THORACOLUMBAR EVALUATION   Patient Name: Sally [name] MRN: [id number] DOB:01-Feb-1946, 78 y.o., female Today's Date: 12/20/2023  END OF SESSION:   Past Medical History:  Diagnosis Date   Allergy    Arthritis    all over   Chronic pain syndrome    Chronic UTI    Fibromyalgia    Hx of colonic polyps    Hypertension    Hypothyroidism    Obesity    Pre-diabetes    barely (12/22/2013)   Past Surgical History:  Procedure Laterality Date   ABDOMINAL HYSTERECTOMY  1990   APPENDECTOMY  1965   CESAREAN SECTION  1980; 1985   CHOLECYSTECTOMY  1982   DILATION AND CURETTAGE OF UTERUS  1990   FOOT SURGERY  1992   KNEE ARTHROSCOPY Right 1992   TONSILLECTOMY  1959   TOTAL SHOULDER ARTHROPLASTY Left 11/25/2012   Procedure: TOTAL SHOULDER ARTHROPLASTY;  Surgeon: Eva Elsie Herring, MD;  Location: [facility] OR;  Service: Orthopedics;  Laterality: Left;  LEFT TOTAL SHOULDER ARTHROPLASTY   TOTAL SHOULDER ARTHROPLASTY Right 12/22/2013   TOTAL SHOULDER ARTHROPLASTY Right 12/22/2013   Procedure: TOTAL SHOULDER ARTHROPLASTY;  Surgeon: Eva Elsie Herring, MD;  Location: [facility] OR;  Service: Orthopedics;  Laterality: Right;  Right shoulder total arthroplasty   TUBAL LIGATION  1985   Patient Active Problem List   Diagnosis Date Noted   Visceral obesity 01/13/2022   Other fatigue 01/13/2022   Other specified hypothyroidism 01/13/2022   Primary osteoarthritis involving multiple joints 01/13/2022   Class 1 obesity with serious comorbidity and body mass index (BMI) of 32.0 to 32.9 in adult 01/13/2022   Arthritis of shoulder region, right, degenerative 12/22/2013   Osteoarthritis of right shoulder 08/09/2013   Right tennis elbow 08/09/2013   Status post total shoulder replacement 11/26/2012   Fibromyalgia 08/15/2010   Chronic pain 08/15/2010   HYPOTHYROIDISM 05/15/2009   HYPERTENSION 05/15/2009   ALLERGIC RHINITIS 05/15/2009   DYSURIA 05/15/2009   History of  colonic polyps 05/15/2009    PCP: Dr Carlin Dale Gull  REFERRING PROVIDER: ***  REFERRING DIAG: Wedge compression fraction L5  Rationale for Evaluation and Treatment: Rehabilitation  THERAPY DIAG:  No diagnosis found.  ONSET DATE: 10/02/22  SUBJECTIVE:                                                                                                                                                                                           SUBJECTIVE STATEMENT: Patient reports that she fell back on to the arm of her couch and injured her back/tailbone ~ 10/02/22 and has had some persistent pain in the tailbone  since that time. She underwent several visits with physical therapy and reported good improvement in symptoms but continued to have some pain and limited ADL's She has increased pain with sitting on hard surfaces or driving. She also notices trouble with balance and feels clumsy, especially going down steps   PERTINENT HISTORY:  Bilat shoulder replacements; bilat OA knees; obesity; HTN; fibromyalgia; chronic fatigue syndrome   PAIN:  Are you having pain? Yes: NPRS scale: *** Pain location: *** Pain description: *** Aggravating factors: *** Relieving factors: ***  PRECAUTIONS: {Therapy precautions:24002}  RED FLAGS: None   WEIGHT BEARING RESTRICTIONS: No  FALLS:  Has patient fallen in last 6 months? 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  DIAGNOSTIC FINDINGS:  No recent imaging of LB available   PATIENT SURVEYS:  {rehab surveys:24030}  COGNITION: Overall cognitive status: Within functional limits for tasks assessed     SENSATION: {sensation:27233}  MUSCLE LENGTH: Hamstrings: Right *** deg; Left *** deg Debby test: Right *** deg; Left *** deg  POSTURE: {posture:25561}  PALPATION: ***  LUMBAR ROM:   AROM eval  Flexion   Extension   Right lateral flexion   Left lateral flexion   Right rotation   Left rotation    (Blank rows = not tested)  LOWER EXTREMITY ROM:     Active  Right eval Left eval  Hip flexion    Hip extension    Hip abduction    Hip adduction    Hip internal rotation    Hip external rotation    Knee flexion    Knee extension    Ankle dorsiflexion    Ankle plantarflexion    Ankle inversion    Ankle eversion     (Blank rows = not tested)  LOWER EXTREMITY MMT:    MMT Right eval Left eval  Hip flexion    Hip extension    Hip abduction    Hip adduction    Hip internal rotation    Hip external rotation    Knee flexion    Knee extension    Ankle dorsiflexion    Ankle plantarflexion    Ankle inversion    Ankle eversion     (Blank rows = not tested)  LUMBAR SPECIAL TESTS:  Straight leg raise test: {pos/neg:25243} and Slump test: {pos/neg:25243}  FUNCTIONAL TESTS:  5 times sit to stand: *** SLS:   GAIT: Distance walked: 40 feet  Assistive device utilized: {Assistive devices:23999} Level of assistance: {Levels of assistance:24026} Comments: ***  TREATMENT DATE: ***  PATIENT EDUCATION:  Education details: POC; HEP  Person educated: Patient Education method: Programmer, multimedia, Demonstration, Actor cues, Verbal cues, and Handouts Education comprehension: verbalized understanding, returned demonstration, verbal cues required, tactile  cues required, and needs further education  HOME EXERCISE PROGRAM: ***  ASSESSMENT:  CLINICAL IMPRESSION: Patient is a 78 y.o. female who was seen today for physical therapy evaluation and treatment for LBP and coccyx pain following wedge compression fracture L5 ~ 10/02/22.   OBJECTIVE IMPAIRMENTS: {opptimpairments:25111}.   ACTIVITY LIMITATIONS: {activitylimitations:27494}  PARTICIPATION LIMITATIONS: {participationrestrictions:25113}  PERSONAL FACTORS: {Personal factors:25162} are also affecting patient's functional outcome.   REHAB POTENTIAL: Good  CLINICAL DECISION MAKING: Evolving/moderate complexity  EVALUATION COMPLEXITY: Moderate   GOALS: Goals reviewed with patient? Yes  SHORT TERM GOALS: Target date: ***  Independent in initial HEP  Baseline: Goal status: INITIAL  2.  Demonstrate and verbalize proper transfers and transitional movements for proper back care and body mechanics  Baseline:  Goal status: INITIAL  3.  Patient demonstrates and verbalizes appropriate sitting and sleeping positions for spine care  Baseline:  Goal status: INITIAL  4.  *** Baseline:  Goal status: INITIAL   LONG TERM GOALS: Target date: ***  *** Baseline:  Goal status: INITIAL  2.  *** Baseline:  Goal status: INITIAL  3.  *** Baseline:  Goal status: INITIAL  4.  *** Baseline:  Goal status: INITIAL  5.  *** Baseline:  Goal status: INITIAL  6.  *** Baseline:  Goal status: INITIAL  PLAN:  PT FREQUENCY: 2x/week  PT DURATION: 8 weeks  PLANNED INTERVENTIONS: 97164- PT Re-evaluation, 97110-Therapeutic exercises, 97530- Therapeutic activity, 97112- Neuromuscular re-education, 97535- Self Care, 02859- Manual therapy, [id number]- Gait training, [phone]- Aquatic Therapy, Patient/Family education, Balance training, Stair training, Taping, and Joint mobilization.  PLAN FOR NEXT SESSION: review and progress exercises; continue with spine care and ergonomic education; manual  work and modalities as indicated    Leidi Astle P Norrine Ballester, PT 12/20/2023, 2:57 PM

## 2023-12-22 ENCOUNTER — Ambulatory Visit: Attending: Orthopedic Surgery | Admitting: Rehabilitative and Restorative Service Providers"

## 2023-12-22 ENCOUNTER — Encounter: Payer: Self-pay | Admitting: Rehabilitative and Restorative Service Providers"

## 2023-12-22 ENCOUNTER — Other Ambulatory Visit: Payer: Self-pay

## 2023-12-22 DIAGNOSIS — M6281 Muscle weakness (generalized): Secondary | ICD-10-CM | POA: Diagnosis present

## 2023-12-22 DIAGNOSIS — R293 Abnormal posture: Secondary | ICD-10-CM | POA: Insufficient documentation

## 2023-12-22 DIAGNOSIS — R29898 Other symptoms and signs involving the musculoskeletal system: Secondary | ICD-10-CM | POA: Insufficient documentation

## 2023-12-22 DIAGNOSIS — M25512 Pain in left shoulder: Secondary | ICD-10-CM | POA: Insufficient documentation

## 2023-12-24 ENCOUNTER — Ambulatory Visit: Admitting: Rehabilitative and Restorative Service Providers"

## 2023-12-24 ENCOUNTER — Encounter: Payer: Self-pay | Admitting: Rehabilitative and Restorative Service Providers"

## 2023-12-24 DIAGNOSIS — R29898 Other symptoms and signs involving the musculoskeletal system: Secondary | ICD-10-CM

## 2023-12-24 DIAGNOSIS — R293 Abnormal posture: Secondary | ICD-10-CM

## 2023-12-24 DIAGNOSIS — M6281 Muscle weakness (generalized): Secondary | ICD-10-CM

## 2023-12-24 DIAGNOSIS — M25512 Pain in left shoulder: Secondary | ICD-10-CM

## 2023-12-24 NOTE — Therapy (Signed)
 OUTPATIENT PHYSICAL THERAPY SHOULDER TREATMENT   Patient Name: Sally [name] MRN: [id number] DOB:06/26/1945, 78 y.o., female Today's Date: 12/24/2023  END OF SESSION:  PT End of Session - 12/24/23 1539     Visit Number 2    Number of Visits 16    Date for Recertification  02/16/24    Authorization Type medicare a & b; bcbs federal secondary    Progress Note Due on Visit 10    PT Start Time 1535    PT Stop Time 1615    PT Time Calculation (min) 40 min    Activity Tolerance Patient tolerated treatment well          Past Medical History:  Diagnosis Date   Allergy    Arthritis    all over   Chronic pain syndrome    Chronic UTI    Fibromyalgia    Hx of colonic polyps    Hypertension    Hypothyroidism    Obesity    Pre-diabetes    barely (12/22/2013)   Past Surgical History:  Procedure Laterality Date   ABDOMINAL HYSTERECTOMY  1990   APPENDECTOMY  1965   CESAREAN SECTION  1980; 1985   CHOLECYSTECTOMY  1982   DILATION AND CURETTAGE OF UTERUS  1990   FOOT SURGERY  1992   KNEE ARTHROSCOPY Right 1992   TONSILLECTOMY  1959   TOTAL SHOULDER ARTHROPLASTY Left 11/25/2012   Procedure: TOTAL SHOULDER ARTHROPLASTY;  Surgeon: Eva Elsie Herring, MD;  Location: [facility] OR;  Service: Orthopedics;  Laterality: Left;  LEFT TOTAL SHOULDER ARTHROPLASTY   TOTAL SHOULDER ARTHROPLASTY Right 12/22/2013   TOTAL SHOULDER ARTHROPLASTY Right 12/22/2013   Procedure: TOTAL SHOULDER ARTHROPLASTY;  Surgeon: Eva Elsie Herring, MD;  Location: [facility] OR;  Service: Orthopedics;  Laterality: Right;  Right shoulder total arthroplasty   TUBAL LIGATION  1985   Patient Active Problem List   Diagnosis Date Noted   Visceral obesity 01/13/2022   Other fatigue 01/13/2022   Other specified hypothyroidism 01/13/2022   Primary osteoarthritis involving multiple joints 01/13/2022   Class 1 obesity with serious comorbidity and body mass index (BMI) of 32.0 to 32.9 in adult 01/13/2022   Arthritis of  shoulder region, right, degenerative 12/22/2013   Osteoarthritis of right shoulder 08/09/2013   Right tennis elbow 08/09/2013   Status post total shoulder replacement 11/26/2012   Fibromyalgia 08/15/2010   Chronic pain 08/15/2010   HYPOTHYROIDISM 05/15/2009   HYPERTENSION 05/15/2009   ALLERGIC RHINITIS 05/15/2009   DYSURIA 05/15/2009   History of colonic polyps 05/15/2009    PCP: Dr Carlin Dale Gull  REFERRING PROVIDER: Dr Eva Herring   REFERRING DIAG: L shoulder pain   Rationale for Evaluation and Treatment: Rehabilitation  THERAPY DIAG:  Acute pain of left shoulder  Other symptoms and signs involving the musculoskeletal system  Muscle weakness (generalized)  Abnormal posture  ONSET DATE: 08/16/23  SUBJECTIVE:  SUBJECTIVE STATEMENT: Much better with less pain and no throbbing in the L arm and hand last night. Some soreness after initial treatment but felt so much better the next day. Pleased with progress.   EVAL: Patient reports that she had a L shoulder replacement 2014 and has had no problems until the past two years. She fractured the L elbow catching a pot. She was placed in soft cast and she thought the elbow was healed. She is now having intense pain in the L wrist and hand. She had pain since the summer with no known injury. Shoulder feels like it did before the shoulder replacement.   PERTINENT HISTORY:  Bilat shoulder replacements 2014; fracture L elbow 2023; bilat OA knees; obesity; HTN; fibromyalgia; chronic fatigue syndrome;  Patient reports that she fell back on to the arm of her couch and injured her back/tailbone ~ 10/02/22 and has had some persistent pain in the tailbone since that time. She underwent several visits with physical therapy and reported good improvement in  symptoms but continued to have some pain and limited ADL's She has increased pain with sitting on hard surfaces or driving. She also notices trouble with balance and feels clumsy, especially going down steps   PAIN:  Are you having pain? Yes: NPRS scale: 5-6/10; best in past week 4/10; worst in past week 8/10  Pain location: L shoulder; L hand Pain description: nagging; aching; sharp; constant  Aggravating factors: lifting weights in the gym but has stopped; stress; household chores; lifting arm to do hair; texting  Relieving factors: massager; meds; heat (ice irritates symptoms)   PRECAUTIONS: Other: bilat TSR    WEIGHT BEARING RESTRICTIONS: No  FALLS:  Has patient fallen in last 6 months? 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  DIAGNOSTIC FINDINGS:  No recent imaging of LB available   PATIENT SURVEYS:  Quick DASH 81.8/100; 81.8%   SENSATION: Intermittent numbness L ring and littler fingers on an intermittent basis x 2 in the past week resolves in 30 sec   POSTURE:  Patient presents with head forward posture with increased thoracic kyphosis; shoulders rounded and elevated; scapulae abducted and rotated along the thoracic spine; head of the humerus anterior in orientation.   PALPATION: Muscular tightness L lateral cervical musculature; pecs; upper trap; leveator; periscapular musculature; teres/lats; biceps; deltoid; forearm flexors    CERVICAL ROM:   AROM eval  Flexion 61  Extension 29  Right lateral flexion 34 tight  Left lateral flexion 20 pain  Right rotation 39 tight  Left rotation 51   (Blank rows = not tested)  UPPER EXTREMITY ROM:  Active ROM Right eval Left eval  Shoulder flexion 130 81 pain  Shoulder extension 60 54 sore  Shoulder abduction 138 104 pain  Shoulder adduction    Shoulder extension    Shoulder internal rotation Hand to waist  Hand lateral hip   Shoulder external rotation 60 58  Elbow flexion    Elbow  extension    Wrist flexion    Wrist extension    Wrist ulnar deviation    Wrist radial deviation    Wrist pronation    Wrist supination     (Blank rows = not tested)   UPPER EXTREMITY MMT: strength not assessed resistively due to pain   MMT Right eval Left eval  Shoulder flexion    Shoulder extension    Shoulder abduction    Shoulder adduction    Shoulder extension    Shoulder internal rotation    Shoulder external rotation    Middle trapezius    Lower trapezius    Elbow flexion    Elbow extension    Wrist flexion    Wrist extension    Wrist ulnar deviation    Wrist radial deviation    Wrist pronation    Wrist supination    Grip strength     (Blank rows = not tested)  SPECIAL TESTS:    Positive neural tension test L UE   [facility] Adult PT Treatment:                                                DATE: 12/24/23 Therapeutic Exercise: Scap squeeze/chest lift sitting with noodle 5 sec x 5 L UE supported on two pillows  Manual Therapy: STM; myofacial release; TPR L upper quarter Manual cervical traction Neural mobilization L UE in supine  Neuromuscular re-ed: Supine  Chin tuck 5 sec x 10  Nodding yes/no Scap squeeze  Therapeutic Activity: Postural correction in sitting and standing  Self Care: Avoid use of UE's in gym for resistive exercises until symptoms are more resolved    TREATMENT DATE: 12/22/23: eval findings; POC; HEP; activities to avoid - manual  work through L upper quarter including TPR and myofacial work; Chief Financial Officer L UE ~ 1 min  2 reps                                                                                                                                  PATIENT EDUCATION:  Education details: POC; HEP  Person educated: Patient Education method: Programmer, multimedia, Facilities manager, Actor cues, Verbal cues, and Handouts Education comprehension: verbalized understanding, returned demonstration, verbal cues required, tactile cues required, and needs further education  HOME EXERCISE PROGRAM: Access Code: [email or web address] URL: [email or web address] Date: 12/24/2023 Prepared by: Everitt Wenner  Exercises - Supine Cervical Retraction with Towel  - 2 x daily - 7 x weekly - 1 sets - 5-10 reps - 5-10 sec  hold - Supine Scapular Retraction  - 2 x daily - 7 x weekly - 1 sets - 5-10 reps - 5-10 sec  hold - Supine Elbow Extension Stretch in Supination  - 2 x daily - 7 x weekly - 1 sets - 2 reps -  30-60 sec  hold - Seated Scapular Retraction  - 2 x daily - 7 x weekly - 1-2 sets - 5 reps - 5- sec  hold  ASSESSMENT:  CLINICAL IMPRESSION: Patient reports positive response to initial treatment and home exercises. Continued with postural correction adding gentle scap squeeze in sitting with noodle. Continued with myofacial work and TPR/STM L upper quarter. Gentle neural mobilization in supine followed by very gentle L elbow extension. Excellent response to treatment with decreased frequency, intensity, duration of pain in shoulder and decreased radicular symptoms.    Eval: Patient is a 78 y.o. female who was seen today for physical therapy evaluation and treatment for L shoulder and UE pain and dysfunction. Patient reports increasing symptoms L shoulder and arm to wrist and hand over the past 4 months with no known injury. Patient has poor posture and alignment; scapular dyskinesis; decreased shoulder/elbow/hand ROM and function;  muscular tightness L upper quarter; positive neural tension test L UE; pain with functional activities and difficulty resting. Patient will benefit from PT to address problems identified. Positive response to intervention in clinic today with patient reporting pain decreased from 8/10 to 4/10.   GOALS: Goals reviewed with patient? Yes  SHORT TERM GOALS: Target date: 01/19/2024   Independent in initial HEP  Baseline: Goal status: INITIAL  2.  Demonstrate and verbalize proper transfers and transitional movements and body mechanics to avoid irritation of L upper quarter  Baseline:  Goal status: INITIAL  3.  Patient demonstrates and verbalizes appropriate sitting and sleeping positions for shoulder and UE position of ease  Baseline:  Goal status: INITIAL  4.  Patient demonstrates ability to perform 20-30 min of exercise for current symptoms with no increase in pain  Baseline:  Goal status: INITIAL   LONG TERM GOALS: Target date: 02/16/2024   Decrease L UE pain and radicular symptoms by 75-90% allowing patient to return to normal functional activities  Baseline:  Goal status: INITIAL  2.  Increase L shoulder AROM by 10-15 deg in shoulder flexion and scaption with minimal to no pain  Baseline:  Goal status: INITIAL  3.  Patient reports full resolution of radicular pain L UE to return to normal ADL's  Baseline:  Goal status: INITIAL  4.  Patient reports and demonstrates ability to return to household chores and activities  Baseline:  Goal status: INITIAL  5.  Improve Quick DASH score by 10% indicating improved functional activity level  Baseline:  Goal status: INITIAL  6.  Independent in HEP including return to gym program  Baseline:  Goal status: INITIAL  PLAN:  PT FREQUENCY: 2x/week  PT DURATION: 8 weeks  PLANNED INTERVENTIONS: 97164- PT Re-evaluation, 97110-Therapeutic exercises, 97530- Therapeutic activity, 97112- Neuromuscular re-education, 97535- Self Care,  97140- Manual therapy, [phone]- Gait training, [id number]- Aquatic Therapy, Patient/Family education, Balance training, Stair training, Taping, and Joint mobilization.  PLAN FOR NEXT SESSION: review and progress exercises; continue with spine care and ergonomic education; manual work and modalities as indicated    Pinchus Weckwerth P Sydny Schnitzler, PT 12/24/2023, 3:39 PM

## 2023-12-29 ENCOUNTER — Ambulatory Visit: Admitting: Rehabilitative and Restorative Service Providers"

## 2023-12-29 ENCOUNTER — Encounter: Payer: Self-pay | Admitting: Rehabilitative and Restorative Service Providers"

## 2023-12-29 DIAGNOSIS — M25512 Pain in left shoulder: Secondary | ICD-10-CM | POA: Diagnosis not present

## 2023-12-29 DIAGNOSIS — M6281 Muscle weakness (generalized): Secondary | ICD-10-CM

## 2023-12-29 DIAGNOSIS — R293 Abnormal posture: Secondary | ICD-10-CM

## 2023-12-29 DIAGNOSIS — R29898 Other symptoms and signs involving the musculoskeletal system: Secondary | ICD-10-CM

## 2023-12-29 NOTE — Therapy (Signed)
 OUTPATIENT PHYSICAL THERAPY SHOULDER TREATMENT   Patient Name: Sally [name] MRN: [id number] DOB:November 29, 1945, 78 y.o., female Today's Date: 12/29/2023  END OF SESSION:  PT End of Session - 12/29/23 1535     Visit Number 3    Number of Visits 16    Date for Recertification  02/16/24    Authorization Type medicare a & b; bcbs federal secondary    Progress Note Due on Visit 10    PT Start Time 1535   patient late for appt   PT Stop Time 1615    PT Time Calculation (min) 40 min    Activity Tolerance Patient tolerated treatment well          Past Medical History:  Diagnosis Date   Allergy    Arthritis    all over   Chronic pain syndrome    Chronic UTI    Fibromyalgia    Hx of colonic polyps    Hypertension    Hypothyroidism    Obesity    Pre-diabetes    barely (12/22/2013)   Past Surgical History:  Procedure Laterality Date   ABDOMINAL HYSTERECTOMY  1990   APPENDECTOMY  1965   CESAREAN SECTION  1980; 1985   CHOLECYSTECTOMY  1982   DILATION AND CURETTAGE OF UTERUS  1990   FOOT SURGERY  1992   KNEE ARTHROSCOPY Right 1992   TONSILLECTOMY  1959   TOTAL SHOULDER ARTHROPLASTY Left 11/25/2012   Procedure: TOTAL SHOULDER ARTHROPLASTY;  Surgeon: Eva Elsie Herring, MD;  Location: [facility] OR;  Service: Orthopedics;  Laterality: Left;  LEFT TOTAL SHOULDER ARTHROPLASTY   TOTAL SHOULDER ARTHROPLASTY Right 12/22/2013   TOTAL SHOULDER ARTHROPLASTY Right 12/22/2013   Procedure: TOTAL SHOULDER ARTHROPLASTY;  Surgeon: Eva Elsie Herring, MD;  Location: [facility] OR;  Service: Orthopedics;  Laterality: Right;  Right shoulder total arthroplasty   TUBAL LIGATION  1985   Patient Active Problem List   Diagnosis Date Noted   Visceral obesity 01/13/2022   Other fatigue 01/13/2022   Other specified hypothyroidism 01/13/2022   Primary osteoarthritis involving multiple joints 01/13/2022   Class 1 obesity with serious comorbidity and body mass index (BMI) of 32.0 to 32.9 in adult  01/13/2022   Arthritis of shoulder region, right, degenerative 12/22/2013   Osteoarthritis of right shoulder 08/09/2013   Right tennis elbow 08/09/2013   Status post total shoulder replacement 11/26/2012   Fibromyalgia 08/15/2010   Chronic pain 08/15/2010   HYPOTHYROIDISM 05/15/2009   HYPERTENSION 05/15/2009   ALLERGIC RHINITIS 05/15/2009   DYSURIA 05/15/2009   History of colonic polyps 05/15/2009    PCP: Dr Carlin Dale Gull  REFERRING PROVIDER: Dr Eva Herring   REFERRING DIAG: L shoulder pain   Rationale for Evaluation and Treatment: Rehabilitation  THERAPY DIAG:  Acute pain of left shoulder  Other symptoms and signs involving the musculoskeletal system  Muscle weakness (generalized)  Abnormal posture  ONSET DATE: 08/16/23  SUBJECTIVE:  SUBJECTIVE STATEMENT: Shoulder is much better with less pain and no throbbing in the L arm and hand. She is having pain in the L hand which started over the weekend. She is not sure what she did to irritate the hand but she is not able to open the hand and pull the thumb back. Using a hand brace which she has had since the first of October. She has bad arthritis in the L thumb and hand. The orthopedist wanted to give her an injection but she did not want to have an injection. She will have pain in the L thumb with certain movements.  Some soreness but felt so much better the next day. Pleased with progress. Can get pain free in the shoulder for short periods of time   EVAL: Patient reports that she had a L shoulder replacement 2014 and has had no problems until the past two years. She fractured the L elbow catching a pot. She was placed in soft cast and she thought the elbow was healed. She is now having intense pain in the L wrist and hand. She had pain since  the summer with no known injury. Shoulder feels like it did before the shoulder replacement.   PERTINENT HISTORY:  Bilat shoulder replacements 2014; fracture L elbow 2023; bilat OA knees; obesity; HTN; fibromyalgia; chronic fatigue syndrome;  Patient reports that she fell back on to the arm of her couch and injured her back/tailbone ~ 10/02/22 and has had some persistent pain in the tailbone since that time. She underwent several visits with physical therapy and reported good improvement in symptoms but continued to have some pain and limited ADL's She has increased pain with sitting on hard surfaces or driving. She also notices trouble with balance and feels clumsy, especially going down steps   PAIN:  Are you having pain? Yes: NPRS scale: 5/10; best in past week 0/10; worst in past week 5-6/10  Pain location: L shoulder; L hand Pain description: nagging; aching; sharp; constant  Aggravating factors: lifting weights in the gym but has stopped; stress; household chores; lifting arm to do hair; texting  Relieving factors: massager; meds; heat (ice irritates symptoms)   PRECAUTIONS: Other: bilat TSR    WEIGHT BEARING RESTRICTIONS: No  FALLS:  Has patient fallen in last 6 months? 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  DIAGNOSTIC FINDINGS:  No recent imaging of LB available   PATIENT SURVEYS:  Quick DASH 81.8/100; 81.8%   SENSATION: Intermittent  numbness L ring and littler fingers on an intermittent basis x 2 in the past week resolves in 30 sec   POSTURE:  Patient presents with head forward posture with increased thoracic kyphosis; shoulders rounded and elevated; scapulae abducted and rotated along the thoracic spine; head of the humerus anterior in orientation.   PALPATION: Muscular tightness L lateral cervical musculature; pecs; upper trap; leveator; periscapular musculature; teres/lats; biceps; deltoid; forearm flexors   CERVICAL ROM:   AROM  eval  Flexion 61  Extension 29  Right lateral flexion 34 tight  Left lateral flexion 20 pain  Right rotation 39 tight  Left rotation 51   (Blank rows = not tested)  UPPER EXTREMITY ROM:  Active ROM Right eval Left eval  Shoulder flexion 130 81 pain  Shoulder extension 60 54 sore  Shoulder abduction 138 104 pain  Shoulder adduction    Shoulder extension    Shoulder internal rotation Hand to waist  Hand lateral hip   Shoulder external rotation 60 58  Elbow flexion    Elbow extension    Wrist flexion    Wrist extension    Wrist ulnar deviation    Wrist radial deviation    Wrist pronation    Wrist supination     (Blank rows = not tested)   UPPER EXTREMITY MMT: strength not assessed resistively due to pain   MMT Right eval Left eval  Shoulder flexion    Shoulder extension    Shoulder abduction    Shoulder adduction    Shoulder extension    Shoulder internal rotation    Shoulder external rotation    Middle trapezius    Lower trapezius    Elbow flexion    Elbow extension    Wrist flexion    Wrist extension    Wrist ulnar deviation    Wrist radial deviation    Wrist pronation    Wrist supination    Grip strength     (Blank rows = not tested)  SPECIAL TESTS:    Positive neural tension test L UE   [facility] Adult PT Treatment:                                                DATE: 12/29/23 Therapeutic Exercise: Scap squeeze/chest lift sitting with noodle 5 sec x 5  L UE supported on two pillows  Manual Therapy: STM; myofacial release; TPR L upper quarter Manual cervical traction Neural mobilization L UE in supine  Manual work L thumb/thenar eminence; distal wrist  TPR L webspace  Neuromuscular re-ed: Sitting  Scap squeeze with coregeous ball at T-spine  Chin tuck 5 sec x 10  Supine  Chin tuck 5 sec x 10  Nodding yes/no Scap squeeze  Therapeutic Activity: Postural correction in sitting and standing  Self Care: Avoid use of UE's in gym for resistive exercises until symptoms are more resolved  Ball release standing  Coregeous ball sitting  Cool comfort CMC splint   [facility] Adult PT Treatment:                                                DATE: 12/24/23 Therapeutic Exercise: Scap squeeze/chest lift sitting with noodle 5 sec x 5 L UE supported on two pillows  Manual Therapy: STM; myofacial release; TPR L upper quarter Manual cervical traction Neural mobilization L UE in supine  Neuromuscular re-ed: Supine  Chin tuck 5 sec x 10  Nodding yes/no Scap squeeze  Therapeutic Activity: Postural correction in sitting and standing  Self Care: Avoid use of UE's in gym for resistive exercises until symptoms are more resolved    TREATMENT DATE: 12/22/23: eval findings; POC; HEP; activities to avoid - manual work  through L upper quarter including TPR and myofacial work; chief financial officer L UE ~ 1 min  2 reps                                                                                                                                  PATIENT EDUCATION:  Education details: POC; HEP  Person educated: Patient Education method: Programmer, Multimedia, Demonstration, Actor cues, Verbal cues, and Handouts Education comprehension: verbalized understanding, returned demonstration, verbal cues required, tactile cues required, and needs further education  HOME EXERCISE PROGRAM: Access Code: [email or web address] URL: [email or web address] Date:  12/29/2023 Prepared by: Mayanna Garlitz  Exercises - Supine Cervical Retraction with Towel  - 2 x daily - 7 x weekly - 1 sets - 5-10 reps - 5-10 sec  hold - Supine Scapular Retraction  - 2 x daily - 7 x weekly - 1 sets - 5-10 reps - 5-10 sec  hold - Supine Elbow Extension Stretch in Supination  - 2 x daily - 7 x weekly - 1 sets - 2 reps - 30-60 sec  hold - Seated Scapular Retraction  - 2 x daily - 7 x weekly - 1-2 sets - 5 reps - 5- sec  hold - Seated Cervical Retraction  - 2 x daily - 7 x weekly - 1-2 sets - 5-10 reps - 10 sec  hold  ASSESSMENT:  CLINICAL IMPRESSION: Patient reports positive response to treatment and home exercises with significant improvement in the R upper quarter symptoms including radicular pain into R UE> she returns today with more pain in the R thumb and distal radial wrist - likely from increased activities with L UE as she has felt better. She has a history of CMC arthritis and has a thumb splint which she uses when symptoms are flared up. Patient has tenderness to palpation in the L thumb including CMC and webspace. She has pain with AROM L thumb. Good response to treatment through the L thumb and continued treatment in L upper quarter. Added postural strengthening and myofacial ball release work for home. Continued with myofacial work and TPR/STM L upper quarter. Gentle neural mobilization in supine followed by very gentle L elbow extension. Excellent response to treatment with decreased frequency, intensity, duration of pain in shoulder and decreased radicular symptoms.    Eval: Patient is a 78 y.o. female who was seen today for physical therapy evaluation and treatment for L shoulder and UE pain and dysfunction. Patient reports increasing symptoms L shoulder and arm to wrist and hand over the past 4 months with no known injury. Patient has poor posture and alignment; scapular dyskinesis; decreased shoulder/elbow/hand ROM and function; muscular tightness L upper quarter;  positive neural tension test L UE; pain with functional activities and difficulty resting. Patient will benefit from PT to address problems identified. Positive response to intervention in clinic today with patient reporting pain decreased from 8/10 to 4/10.   GOALS: Goals reviewed with patient?  Yes  SHORT TERM GOALS: Target date: 01/19/2024   Independent in initial HEP  Baseline: Goal status: INITIAL  2.  Demonstrate and verbalize proper transfers and transitional movements and body mechanics to avoid irritation of L upper quarter  Baseline:  Goal status: INITIAL  3.  Patient demonstrates and verbalizes appropriate sitting and sleeping positions for shoulder and UE position of ease  Baseline:  Goal status: INITIAL  4.  Patient demonstrates ability to perform 20-30 min of exercise for current symptoms with no increase in pain  Baseline:  Goal status: INITIAL   LONG TERM GOALS: Target date: 02/16/2024   Decrease L UE pain and radicular symptoms by 75-90% allowing patient to return to normal functional activities  Baseline:  Goal status: INITIAL  2.  Increase L shoulder AROM by 10-15 deg in shoulder flexion and scaption with minimal to no pain  Baseline:  Goal status: INITIAL  3.  Patient reports full resolution of radicular pain L UE to return to normal ADL's  Baseline:  Goal status: INITIAL  4.  Patient reports and demonstrates ability to return to household chores and activities  Baseline:  Goal status: INITIAL  5.  Improve Quick DASH score by 10% indicating improved functional activity level  Baseline:  Goal status: INITIAL  6.  Independent in HEP including return to gym program  Baseline:  Goal status: INITIAL  PLAN:  PT FREQUENCY: 2x/week  PT DURATION: 8 weeks  PLANNED INTERVENTIONS: 97164- PT Re-evaluation, 97110-Therapeutic exercises, 97530- Therapeutic activity, 97112- Neuromuscular re-education, 97535- Self Care, [id number]- Manual therapy, [phone]- Gait  training, [phone]- Aquatic Therapy, Patient/Family education, Balance training, Stair training, Taping, and Joint mobilization.  PLAN FOR NEXT SESSION: review and progress exercises; continue with spine care and ergonomic education; manual work and modalities as indicated    Vedanshi Massaro P Jahnya Trindade, PT 12/29/2023, 5:11 PM

## 2023-12-31 ENCOUNTER — Encounter: Payer: Self-pay | Admitting: Rehabilitative and Restorative Service Providers"

## 2023-12-31 ENCOUNTER — Ambulatory Visit: Admitting: Rehabilitative and Restorative Service Providers"

## 2023-12-31 DIAGNOSIS — M6281 Muscle weakness (generalized): Secondary | ICD-10-CM

## 2023-12-31 DIAGNOSIS — R293 Abnormal posture: Secondary | ICD-10-CM

## 2023-12-31 DIAGNOSIS — R29898 Other symptoms and signs involving the musculoskeletal system: Secondary | ICD-10-CM

## 2023-12-31 DIAGNOSIS — M25512 Pain in left shoulder: Secondary | ICD-10-CM

## 2023-12-31 NOTE — Therapy (Signed)
 OUTPATIENT PHYSICAL THERAPY SHOULDER TREATMENT   Patient Name: Sally [name] MRN: [id number] DOB:[birth year], 78 y.o., female Today's Date: 12/31/2023  END OF SESSION:  PT End of Session - 12/31/23 1315     Visit Number 4    Number of Visits 16    Date for Recertification  02/16/24    Authorization Type medicare a & b; bcbs federal secondary    Progress Note Due on Visit 10    PT Start Time 1315    PT Stop Time 1401    PT Time Calculation (min) 46 min    Activity Tolerance Patient tolerated treatment well          Past Medical History:  Diagnosis Date   Allergy    Arthritis    all over   Chronic pain syndrome    Chronic UTI    Fibromyalgia    Hx of colonic polyps    Hypertension    Hypothyroidism    Obesity    Pre-diabetes    barely (12/22/2013)   Past Surgical History:  Procedure Laterality Date   ABDOMINAL HYSTERECTOMY  1990   APPENDECTOMY  1965   CESAREAN SECTION  1980; 1985   CHOLECYSTECTOMY  1982   DILATION AND CURETTAGE OF UTERUS  1990   FOOT SURGERY  1992   KNEE ARTHROSCOPY Right 1992   TONSILLECTOMY  1959   TOTAL SHOULDER ARTHROPLASTY Left 11/25/2012   Procedure: TOTAL SHOULDER ARTHROPLASTY;  Surgeon: Eva Elsie Herring, MD;  Location: [facility] OR;  Service: Orthopedics;  Laterality: Left;  LEFT TOTAL SHOULDER ARTHROPLASTY   TOTAL SHOULDER ARTHROPLASTY Right 12/22/2013   TOTAL SHOULDER ARTHROPLASTY Right 12/22/2013   Procedure: TOTAL SHOULDER ARTHROPLASTY;  Surgeon: Eva Elsie Herring, MD;  Location: [facility] OR;  Service: Orthopedics;  Laterality: Right;  Right shoulder total arthroplasty   TUBAL LIGATION  1985   Patient Active Problem List   Diagnosis Date Noted   Visceral obesity 01/13/2022   Other fatigue 01/13/2022   Other specified hypothyroidism 01/13/2022   Primary osteoarthritis involving multiple joints 01/13/2022   Class 1 obesity with serious comorbidity and body mass index (BMI) of 32.0 to 32.9 in adult 01/13/2022   Arthritis of  shoulder region, right, degenerative 12/22/2013   Osteoarthritis of right shoulder 08/09/2013   Right tennis elbow 08/09/2013   Status post total shoulder replacement 11/26/2012   Fibromyalgia 08/15/2010   Chronic pain 08/15/2010   HYPOTHYROIDISM 05/15/2009   HYPERTENSION 05/15/2009   ALLERGIC RHINITIS 05/15/2009   DYSURIA 05/15/2009   History of colonic polyps 05/15/2009    PCP: Dr Carlin Dale Gull  REFERRING PROVIDER: Dr Eva Herring   REFERRING DIAG: L shoulder pain   Rationale for Evaluation and Treatment: Rehabilitation  THERAPY DIAG:  Acute pain of left shoulder  Other symptoms and signs involving the musculoskeletal system  Muscle weakness (generalized)  Abnormal posture  ONSET DATE: 08/16/23  SUBJECTIVE:  SUBJECTIVE STATEMENT: Shoulder is much better with no pain or throbbing in the L shoulder, elbow or arm. She still has pain in the L hand/thumb at times - usually related to use of L hand. She is using a hand brace which does help some. Some soreness but felt so much better the next day. Can tell she is making progress.    EVAL: Patient reports that she had a L shoulder replacement 2014 and has had no problems until the past two years. She fractured the L elbow catching a pot. She was placed in soft cast and she thought the elbow was healed. She is now having intense pain in the L wrist and hand. She had pain since the summer with no known injury. Shoulder feels like it did before the shoulder replacement.  Addendum: She has bad arthritis in the L thumb and hand. The orthopedist wanted to give her an injection but she did not want to have an injection. She will have pain in the L thumb with certain movements.   PERTINENT HISTORY:  Bilat shoulder replacements 2014; fracture L elbow  2023; bilat OA knees; obesity; HTN; fibromyalgia; chronic fatigue syndrome;  Patient reports that she fell back on to the arm of her couch and injured her back/tailbone ~ 10/02/22 and has had some persistent pain in the tailbone since that time. She underwent several visits with physical therapy and reported good improvement in symptoms but continued to have some pain and limited ADL's She has increased pain with sitting on hard surfaces or driving. She also notices trouble with balance and feels clumsy, especially going down steps   PAIN:  Are you having pain? Yes: NPRS scale: 0/10; best in past week 0/10; worst in past week 5-6/10  Pain location: L shoulder; L hand Pain description: nagging; aching; sharp; constant  Aggravating factors: lifting weights in the gym but has stopped; stress; household chores; lifting arm to do hair; texting  Relieving factors: massager; meds; heat (ice irritates symptoms)   PRECAUTIONS: Other: bilat TSR    WEIGHT BEARING RESTRICTIONS: No  FALLS:  Has patient fallen in last 6 months? 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  DIAGNOSTIC FINDINGS:  No recent imaging of LB available   PATIENT SURVEYS:  Quick DASH 81.8/100; 81.8%   SENSATION: Intermittent numbness L ring and littler fingers on an intermittent basis x 2 in the past week resolves in 30 sec   POSTURE:  Patient presents with head forward posture with increased  thoracic kyphosis; shoulders rounded and elevated; scapulae abducted and rotated along the thoracic spine; head of the humerus anterior in orientation.   PALPATION: Muscular tightness L lateral cervical musculature; pecs; upper trap; leveator; periscapular musculature; teres/lats; biceps; deltoid; forearm flexors   CERVICAL ROM:   AROM eval  Flexion 61  Extension 29  Right lateral flexion 34 tight  Left lateral flexion 20 pain  Right rotation 39 tight  Left rotation 51   (  Blank rows = not tested)  UPPER EXTREMITY ROM:  Active ROM Right eval Left eval  Shoulder flexion 130 81 pain  Shoulder extension 60 54 sore  Shoulder abduction 138 104 pain  Shoulder adduction    Shoulder extension    Shoulder internal rotation Hand to waist  Hand lateral hip   Shoulder external rotation 60 58  Elbow flexion    Elbow extension    Wrist flexion    Wrist extension    Wrist ulnar deviation    Wrist radial deviation    Wrist pronation    Wrist supination     (Blank rows = not tested)   UPPER EXTREMITY MMT: strength not assessed resistively due to pain   MMT Right eval Left eval  Shoulder flexion    Shoulder extension    Shoulder abduction    Shoulder adduction    Shoulder extension    Shoulder internal rotation    Shoulder external rotation    Middle trapezius    Lower trapezius    Elbow flexion    Elbow extension    Wrist flexion    Wrist extension    Wrist ulnar deviation    Wrist radial deviation    Wrist pronation    Wrist supination    Grip strength     (Blank rows = not tested)  SPECIAL TESTS:    Positive neural tension test L UE   [facility] Adult PT Treatment:                                                DATE: 12/31/23 Therapeutic Exercise: Scap squeeze/chest lift sitting with noodle 5 sec x 5 L UE supported on one pillow  Manual Therapy: STM; myofacial release; TPR L upper quarter Manual cervical traction Neural mobilization L UE in supine  Manual work L  thumb/thenar eminence; distal wrist  TPR L webspace  PROM L elbow into extension positioning to avoid neural tightness Neuromuscular re-ed: Sitting  Scap squeeze with coregeous ball at T-spine  Chin tuck 5 sec x 10  Supine  Chin tuck 5 sec x 10  Nodding yes/no Scap squeeze  Therapeutic Activity: Postural correction in sitting and standing  Self Care: Avoid use of UE's in gym for resistive exercises until symptoms are more resolved  Ball release standing  Coregeous ball sitting  Cool comfort CMC splint    [facility] Adult PT Treatment:                                                DATE: 12/29/23 Therapeutic Exercise: Scap squeeze/chest lift sitting with noodle 5 sec x 5 L UE supported on two pillows  Manual Therapy: STM; myofacial release; TPR L upper quarter Manual cervical traction Neural mobilization L UE in supine  Manual work L thumb/thenar eminence; distal wrist  TPR L webspace  Neuromuscular re-ed: Sitting  Scap squeeze with coregeous ball at T-spine  Chin tuck 5 sec x 10  Supine  Chin tuck 5 sec x 10  Nodding yes/no Scap squeeze  Therapeutic Activity: Postural correction in sitting and standing  Self Care: Avoid use of UE's in gym for resistive exercises until symptoms are more resolved  Ball release standing  Coregeous  ball sitting  Cool comfort CMC splint   [facility] Adult PT Treatment:                                                DATE: 12/24/23 Therapeutic Exercise: Scap squeeze/chest lift sitting with noodle 5 sec x 5 L UE supported on two pillows  Manual Therapy: STM; myofacial release; TPR L upper quarter Manual cervical traction Neural mobilization L UE in supine  Neuromuscular re-ed: Supine  Chin tuck 5 sec x 10  Nodding yes/no Scap squeeze  Therapeutic Activity: Postural correction in sitting and standing  Self Care: Avoid use of UE's in gym for resistive exercises until symptoms are more resolved    TREATMENT DATE: 12/22/23: eval findings; POC;  HEP; activities to avoid - manual work through L upper quarter including TPR and myofacial work; chief financial officer L UE ~ 1 min  2 reps                                                                                                                                  PATIENT EDUCATION:  Education details: POC; HEP  Person educated: Patient Education method: Programmer, Multimedia, Facilities Manager, Actor cues, Verbal cues, and Handouts Education comprehension: verbalized understanding, returned demonstration, verbal cues required, tactile cues required, and needs further education  HOME EXERCISE PROGRAM: Access Code: [email or web address] URL: [email or web address] Date: 12/29/2023 Prepared by: Imojean Yoshino  Exercises - Supine Cervical Retraction with Towel  - 2 x daily - 7 x weekly - 1 sets - 5-10 reps - 5-10 sec  hold - Supine Scapular Retraction  - 2 x daily - 7 x weekly - 1 sets - 5-10 reps - 5-10 sec  hold - Supine Elbow Extension Stretch in Supination  - 2 x daily - 7 x weekly - 1 sets - 2 reps - 30-60 sec  hold - Seated Scapular Retraction  - 2 x daily - 7 x weekly - 1-2 sets - 5 reps - 5- sec  hold - Seated Cervical Retraction  - 2 x daily - 7 x weekly - 1-2 sets - 5-10 reps - 10 sec  hold  ASSESSMENT:  CLINICAL IMPRESSION: Patient reports and demonstrates improvement in radicular symptoms in L UE. She has responded well to manual work including STM, myofacial release, TPR, neural mobilization. Keiaira is pain free in the L cervical spine , shoulder/scapular area, arm most of the time. She is sleeping much better and reports significant improvement in the R upper quarter symptoms. She has persistent pain in R thumb and distal radial wrist but pain is decreased compared to last visit. Good response to treatment through the L thumb and continued treatment in L upper quarter. Continued postural strengthening and myofacial ball release work for home as well as myofacial work and TPR/STM L upper  quarter. Gentle neural mobilization in supine followed by gentle L elbow extension and supination in position. Excellent response to treatment with decreased frequency, intensity, duration of pain in shoulder and decreased radicular symptoms.    Eval: Patient is a 78 y.o. female who was seen today for physical therapy evaluation and treatment for L shoulder and UE pain and dysfunction. Patient reports increasing symptoms L shoulder and arm to wrist and hand over the past 4 months with no known injury. Patient has poor posture and alignment; scapular dyskinesis; decreased shoulder/elbow/hand ROM and function; muscular tightness L upper quarter; positive neural tension test L UE; pain with functional activities and difficulty resting. Patient will benefit from PT to address problems identified. Positive response to intervention in clinic today with patient reporting pain decreased from 8/10 to 4/10.   12/31/23: Addendum: She has a history of CMC arthritis and has a thumb splint which she uses when symptoms are flared up. Patient has tenderness to palpation in the L thumb including CMC and webspace. She has pain with AROM L thumb.  GOALS: Goals reviewed with patient? Yes  SHORT TERM GOALS: Target date: 01/19/2024   Independent in initial HEP  Baseline: Goal status: INITIAL  2.  Demonstrate and verbalize proper transfers and transitional movements and body mechanics to avoid irritation of L upper quarter  Baseline:  Goal status: INITIAL  3.  Patient demonstrates and verbalizes appropriate sitting and sleeping positions for shoulder and UE position of ease  Baseline:  Goal status: INITIAL  4.  Patient demonstrates ability to perform 20-30 min of exercise for current symptoms with no increase in pain  Baseline:  Goal status: INITIAL   LONG TERM GOALS: Target date: 02/16/2024   Decrease L UE pain and radicular symptoms by 75-90% allowing patient to return to normal functional activities   Baseline:  Goal status: INITIAL  2.  Increase L shoulder AROM by 10-15 deg in shoulder flexion and scaption with minimal to no pain  Baseline:  Goal status: INITIAL  3.  Patient reports full resolution of radicular pain L UE to return to normal ADL's  Baseline:  Goal status: INITIAL  4.  Patient reports and demonstrates ability to return to household chores and activities  Baseline:  Goal status: INITIAL  5.  Improve Quick DASH score by 10% indicating improved functional activity level  Baseline:  Goal status: INITIAL  6.  Independent in HEP including return to gym program  Baseline:  Goal status: INITIAL  PLAN:  PT FREQUENCY: 2x/week  PT DURATION: 8 weeks  PLANNED INTERVENTIONS: 97164- PT Re-evaluation, 97110-Therapeutic exercises, 97530- Therapeutic activity, 97112- Neuromuscular re-education, 97535- Self Care, [id number]- Manual therapy, [phone]- Gait training, [phone]- Aquatic Therapy, Patient/Family education, Balance training, Stair training, Taping, and Joint mobilization.  PLAN FOR NEXT SESSION: review and progress exercises; continue with spine care and ergonomic education; manual work and modalities as indicated    Asaph Serena P Alona Danford, PT 12/31/2023, 1:16 PM

## 2024-01-01 ENCOUNTER — Ambulatory Visit: Admitting: Rehabilitative and Restorative Service Providers"

## 2024-01-04 ENCOUNTER — Ambulatory Visit: Attending: Family Medicine | Admitting: Physical Therapy

## 2024-01-04 ENCOUNTER — Encounter: Payer: Self-pay | Admitting: Physical Therapy

## 2024-01-04 DIAGNOSIS — R29898 Other symptoms and signs involving the musculoskeletal system: Secondary | ICD-10-CM | POA: Insufficient documentation

## 2024-01-04 DIAGNOSIS — R293 Abnormal posture: Secondary | ICD-10-CM | POA: Insufficient documentation

## 2024-01-04 DIAGNOSIS — M25512 Pain in left shoulder: Secondary | ICD-10-CM | POA: Diagnosis present

## 2024-01-04 DIAGNOSIS — M6281 Muscle weakness (generalized): Secondary | ICD-10-CM | POA: Diagnosis present

## 2024-01-04 NOTE — Therapy (Signed)
 OUTPATIENT PHYSICAL THERAPY SHOULDER TREATMENT   [name] MRN: [id number] DOB:21-May-1945, 78 y.o., female Today's Date: 01/04/2024  END OF SESSION:  PT End of Session - 01/04/24 1454     Visit Number 5    Number of Visits 16    Date for Recertification  02/16/24    Authorization Type medicare a & b; [employer] federal secondary    Progress Note Due on Visit 10    PT Start Time 1454   late check in   PT Stop Time 1531    PT Time Calculation (min) 37 min           Past Medical History:  Diagnosis Date   Allergy    Arthritis    all over   Chronic pain syndrome    Chronic UTI    Fibromyalgia    Hx of colonic polyps    Hypertension    Hypothyroidism    Obesity    Pre-diabetes    barely (12/22/2013)   Past Surgical History:  Procedure Laterality Date   ABDOMINAL HYSTERECTOMY  1990   APPENDECTOMY  1965   CESAREAN SECTION  1980; 1985   CHOLECYSTECTOMY  1982   DILATION AND CURETTAGE OF UTERUS  1990   FOOT SURGERY  1992   KNEE ARTHROSCOPY Right 1992   TONSILLECTOMY  1959   TOTAL SHOULDER ARTHROPLASTY Left 11/25/2012   Procedure: TOTAL SHOULDER ARTHROPLASTY;  Surgeon: Sally Elsie Herring, MD;  Location: [facility] OR;  Service: Orthopedics;  Laterality: Left;  LEFT TOTAL SHOULDER ARTHROPLASTY   TOTAL SHOULDER ARTHROPLASTY Right 12/22/2013   TOTAL SHOULDER ARTHROPLASTY Right 12/22/2013   Procedure: TOTAL SHOULDER ARTHROPLASTY;  Surgeon: Sally Elsie Herring, MD;  Location: [facility] OR;  Service: Orthopedics;  Laterality: Right;  Right shoulder total arthroplasty   TUBAL LIGATION  1985   Patient Active Problem List   Diagnosis Date Noted   Visceral obesity 01/13/2022   Other fatigue 01/13/2022   Other specified hypothyroidism 01/13/2022   Primary osteoarthritis involving multiple joints 01/13/2022   Class 1 obesity with serious comorbidity and body mass index (BMI) of 32.0 to 32.9 in adult 01/13/2022   Arthritis of shoulder region, right, degenerative  12/22/2013   Osteoarthritis of right shoulder 08/09/2013   Right tennis elbow 08/09/2013   Status post total shoulder replacement 11/26/2012   Fibromyalgia 08/15/2010   Chronic pain 08/15/2010   HYPOTHYROIDISM 05/15/2009   HYPERTENSION 05/15/2009   ALLERGIC RHINITIS 05/15/2009   DYSURIA 05/15/2009   History of colonic polyps 05/15/2009    PCP: Dr Sally [name]  REFERRING PROVIDER: Dr Sally [name]   REFERRING DIAG: L shoulder pain   Rationale for Evaluation and Treatment: Rehabilitation  THERAPY DIAG:  Acute pain of left shoulder  Other symptoms and signs involving the musculoskeletal system  Muscle weakness (generalized)  ONSET DATE: 08/16/23  SUBJECTIVE:  SUBJECTIVE STATEMENT: 01/04/2024: states her hand is bothering her quite a bit today, 8/10 dorsal thumb. States she was really busy with housework and cooking over weekend. No pain in elbow/shoulder at present, states those are doing much better overall.   EVAL: Patient reports that she had a L shoulder replacement 2014 and has had no problems until the past two years. She fractured the L elbow catching a pot. She was placed in soft cast and she thought the elbow was healed. She is now having intense pain in the L wrist and hand. She had pain since the summer with no known injury. Shoulder feels like it did before the shoulder replacement.  Addendum: She has bad arthritis in the L thumb and hand. The orthopedist wanted to give her an injection but she did not want to have an injection. She will have pain in the L thumb with certain movements.   PERTINENT HISTORY:  Bilat shoulder replacements 2014; fracture L elbow 2023; bilat OA knees; obesity; HTN; fibromyalgia; chronic fatigue syndrome;  Patient reports that she fell back on to the arm  of her couch and injured her back/tailbone ~ 10/02/22 and has had some persistent pain in the tailbone since that time. She underwent several visits with physical therapy and reported good improvement in symptoms but continued to have some pain and limited ADL's She has increased pain with sitting on hard surfaces or driving. She also notices trouble with balance and feels clumsy, especially going down steps   PAIN:  Are you having pain? Yes: NPRS scale: 8/10; best in past week 0/10; worst in past week 5-6/10  Pain location: L shoulder; L hand Pain description: nagging; aching; sharp; constant  Aggravating factors: lifting weights in the gym but has stopped; stress; household chores; lifting arm to do hair; texting  Relieving factors: massager; meds; heat (ice irritates symptoms)   PRECAUTIONS: Other: bilat TSR    WEIGHT BEARING RESTRICTIONS: No  FALLS:  Has patient fallen in last 6 months? 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  DIAGNOSTIC FINDINGS:  No recent imaging of LB available   PATIENT SURVEYS:  Quick DASH 81.8/100; 81.8%   SENSATION: Intermittent numbness L ring and littler fingers on an intermittent basis x 2 in the past week resolves in 30 sec   POSTURE:  Patient presents with head forward posture with increased thoracic kyphosis; shoulders rounded and elevated; scapulae abducted and rotated along the thoracic spine; head of the humerus  anterior in orientation.   PALPATION: Muscular tightness L lateral cervical musculature; pecs; upper trap; leveator; periscapular musculature; teres/lats; biceps; deltoid; forearm flexors   CERVICAL ROM:   AROM eval  Flexion 61  Extension 29  Right lateral flexion 34 tight  Left lateral flexion 20 pain  Right rotation 39 tight  Left rotation 51   (Blank rows = not tested)  UPPER EXTREMITY ROM:  Active ROM Right eval Left eval  Shoulder flexion 130 81 pain  Shoulder extension 60 54 sore  Shoulder abduction 138 104 pain  Shoulder adduction    Shoulder extension    Shoulder internal rotation Hand to waist  Hand lateral hip   Shoulder external rotation 60 58  Elbow flexion    Elbow extension    Wrist flexion    Wrist extension    Wrist ulnar deviation    Wrist radial deviation    Wrist pronation    Wrist supination     (Blank rows = not tested)   UPPER EXTREMITY MMT: strength not assessed resistively due to pain   MMT Right eval Left eval  Shoulder flexion    Shoulder extension    Shoulder abduction    Shoulder adduction    Shoulder extension    Shoulder internal rotation    Shoulder external rotation    Middle trapezius    Lower trapezius    Elbow flexion    Elbow extension    Wrist flexion    Wrist extension    Wrist ulnar deviation    Wrist radial deviation    Wrist pronation    Wrist supination    Grip strength     (Blank rows = not tested)  SPECIAL TESTS:    Positive neural tension test L UE    [facility] Adult PT Treatment:                                                DATE: 01/04/24 Therapeutic Exercise: Wrist prayer stretch 4x15sec Towel wring flex/ext; x10 cues for elbow positioning (more comfortable with elbow flexed) Pinch grip iso w/ towel; 3 sec hold x10  Pincer grip iso w/ towel; 3 sec hold x10 (elbow flexed, pronated)  HEP update + education/discussion, handout, discussed relevant anatomy/physiology and rationale for  interventions  Manual Therapy: Supported; STM to L volar aspect of forearm, trigger point release wrist flexors; CMC distraction grade 1-2, multiplanar distraction/mobs grade 1-2; trigger point release and STM brachioradialis    [facility] Adult PT Treatment:                                                DATE: 12/31/23 Therapeutic Exercise: Scap squeeze/chest lift sitting with noodle 5 sec x 5 L UE supported on one pillow  Manual Therapy: STM; myofacial release; TPR L upper quarter Manual cervical traction Neural mobilization L UE in supine  Manual work L thumb/thenar eminence; distal wrist  TPR L webspace  PROM L elbow into extension positioning to avoid neural tightness Neuromuscular re-ed: Sitting  Scap squeeze with coregeous ball at T-spine  Chin tuck 5 sec x 10  Supine  Chin tuck 5 sec x 10  Nodding yes/no Scap squeeze  Therapeutic Activity: Postural correction in sitting and standing  Self Care: Avoid use of UE's in gym for resistive exercises until symptoms are more resolved  Ball release standing  Coregeous ball sitting  Cool comfort CMC splint    [facility] Adult PT Treatment:  DATE: 12/29/23 Therapeutic Exercise: Scap squeeze/chest lift sitting with noodle 5 sec x 5 L UE supported on two pillows  Manual Therapy: STM; myofacial release; TPR L upper quarter Manual cervical traction Neural mobilization L UE in supine  Manual work L thumb/thenar eminence; distal wrist  TPR L webspace  Neuromuscular re-ed: Sitting  Scap squeeze with coregeous ball at T-spine  Chin tuck 5 sec x 10  Supine  Chin tuck 5 sec x 10  Nodding yes/no Scap squeeze  Therapeutic Activity: Postural correction in sitting and standing  Self Care: Avoid use of UE's in gym for resistive exercises until symptoms are more resolved  Ball release standing  Coregeous ball sitting  Cool comfort CMC splint   [facility] Adult PT Treatment:                                                 DATE: 12/24/23 Therapeutic Exercise: Scap squeeze/chest lift sitting with noodle 5 sec x 5 L UE supported on two pillows  Manual Therapy: STM; myofacial release; TPR L upper quarter Manual cervical traction Neural mobilization L UE in supine  Neuromuscular re-ed: Supine  Chin tuck 5 sec x 10  Nodding yes/no Scap squeeze  Therapeutic Activity: Postural correction in sitting and standing  Self Care: Avoid use of UE's in gym for resistive exercises until symptoms are more resolved    TREATMENT DATE: 12/22/23: eval findings; POC; HEP; activities to avoid - manual work through L upper quarter including TPR and myofacial work; chief financial officer L UE ~ 1 min  2 reps                                                                                                                                  PATIENT EDUCATION:  Education details: POC; HEP  Person educated: Patient Education method: Programmer, Multimedia, Facilities Manager, Actor cues, Verbal cues, and Handouts Education comprehension: verbalized understanding, returned demonstration, verbal cues required, tactile cues required, and needs further education  HOME EXERCISE PROGRAM: Access Code: [email or web address] URL: [email or web address] Date: 01/04/2024 Prepared by: Alm Jenny  Exercises - Supine Cervical Retraction with Towel  - 2 x daily - 7 x weekly - 1 sets - 5-10 reps - 5-10 sec  hold - Supine Scapular Retraction  - 2 x daily - 7 x weekly - 1 sets - 5-10 reps - 5-10 sec  hold - Supine Elbow Extension Stretch in Supination  - 2 x daily - 7 x weekly - 1 sets - 2 reps - 30-60 sec  hold - Seated Scapular Retraction  - 2 x daily - 7 x weekly - 1-2 sets - 5 reps - 5- sec  hold - Seated Cervical Retraction  - 2 x daily - 7 x weekly - 1-2 sets - 5-10 reps - 10 sec  hold - Key Pinch with Putty  - 1 x daily - 7 x weekly - 2-3 sets - 8-10 reps - Tip Pinch with Putty  - 1 x daily - 7 x weekly - 2-3 sets - 8-10  reps  ASSESSMENT:  CLINICAL IMPRESSION: 01/04/2024: Pt arrives w/ 8/10 pain in L thumb from inc activities over weekend, shoulder/elbow doing much better. Initiating session with manual as above, trigger points throughout wrist flexors and thumb musculature, proximal trigger points provoking distal pain and numbness although relief provided with trigger point release. Also reports good relief w/ CMC distraction. Reports pain down to 5/10 after manual. Following this with local tissue loading as above, tendency towards improved comfort with elbow flexed and wrist pronated. Reports positive response to thumb isometrics with resolution of pain. 0/10 on departure, no adverse events, HEP update as above. Recommend continuing along current POC in order to address relevant deficits and improve functional tolerance. Pt departs today's session in no acute distress, all voiced questions/concerns addressed appropriately from PT perspective.     Eval: Patient is a 78 y.o. female who was seen today for physical therapy evaluation and treatment for L shoulder and UE pain and dysfunction. Patient reports increasing symptoms L shoulder and arm to wrist and hand over the past 4 months with no known injury. Patient has poor posture and alignment; scapular dyskinesis; decreased shoulder/elbow/hand ROM and function; muscular tightness L upper quarter; positive neural tension test L UE; pain with functional activities and difficulty resting. Patient will benefit from PT to address problems identified. Positive response to intervention in clinic today with patient reporting pain decreased from 8/10 to 4/10.   12/31/23: Addendum: She has a history of CMC arthritis and has a thumb splint which she uses when symptoms are flared up. Patient has tenderness to palpation in the L thumb including CMC and webspace. She has pain with AROM L thumb.  GOALS: Goals reviewed with patient? Yes  SHORT TERM GOALS: Target date:  01/19/2024   Independent in initial HEP  Baseline: Goal status: INITIAL  2.  Demonstrate and verbalize proper transfers and transitional movements and body mechanics to avoid irritation of L upper quarter  Baseline:  Goal status: INITIAL  3.  Patient demonstrates and verbalizes appropriate sitting and sleeping positions for shoulder and UE position of ease  Baseline:  Goal status: INITIAL  4.  Patient demonstrates ability to perform 20-30 min of exercise for current symptoms with no increase in pain  Baseline:  Goal status: INITIAL   LONG TERM GOALS: Target date: 02/16/2024   Decrease L UE pain and radicular symptoms by 75-90% allowing patient to return to normal functional activities  Baseline:  Goal status: INITIAL  2.  Increase L shoulder AROM by 10-15 deg in shoulder flexion and scaption with minimal to no pain  Baseline:  Goal status: INITIAL  3.  Patient reports full resolution of radicular pain L UE to return to normal ADL's  Baseline:  Goal status: INITIAL  4.  Patient reports and demonstrates ability to return to household chores and activities  Baseline:  Goal status: INITIAL  5.  Improve Quick DASH score by 10% indicating improved functional activity level  Baseline:  Goal status: INITIAL  6.  Independent in HEP including return to gym program  Baseline:  Goal status: INITIAL  PLAN:  PT FREQUENCY: 2x/week  PT DURATION: 8 weeks  PLANNED INTERVENTIONS: [id number]- PT Re-evaluation, 97110-Therapeutic exercises, 97530- Therapeutic activity, 97112- Neuromuscular re-education, 97535- Self Care, [id number]-  Manual therapy, [id number]- Gait training, [id number]- Aquatic Therapy, Patient/Family education, Balance training, Stair training, Taping, and Joint mobilization.  PLAN FOR NEXT SESSION: review and progress exercises; continue with spine care and ergonomic education; manual work and modalities as indicated    Alm DELENA Jenny PT, DPT 01/04/2024 3:42 PM

## 2024-01-06 ENCOUNTER — Ambulatory Visit

## 2024-01-06 DIAGNOSIS — R29898 Other symptoms and signs involving the musculoskeletal system: Secondary | ICD-10-CM

## 2024-01-06 DIAGNOSIS — R293 Abnormal posture: Secondary | ICD-10-CM

## 2024-01-06 DIAGNOSIS — M25512 Pain in left shoulder: Secondary | ICD-10-CM

## 2024-01-06 DIAGNOSIS — M6281 Muscle weakness (generalized): Secondary | ICD-10-CM

## 2024-01-06 NOTE — Therapy (Signed)
 OUTPATIENT PHYSICAL THERAPY SHOULDER TREATMENT   Patient Name: Sally [name] MRN: [id number] DOB:[birth year], 78 y.o., female Today's Date: 01/06/2024  END OF SESSION:  PT End of Session - 01/06/24 1537     Visit Number 6    Number of Visits 16    Date for Recertification  02/16/24    Authorization Type medicare a & b; bcbs federal secondary    Progress Note Due on Visit 10    PT Start Time 1537   pt arrived late   PT Stop Time 1620    PT Time Calculation (min) 43 min    Activity Tolerance Patient tolerated treatment well    Behavior During Therapy WFL for tasks assessed/performed           Past Medical History:  Diagnosis Date   Allergy    Arthritis    all over   Chronic pain syndrome    Chronic UTI    Fibromyalgia    Hx of colonic polyps    Hypertension    Hypothyroidism    Obesity    Pre-diabetes    barely (12/22/2013)   Past Surgical History:  Procedure Laterality Date   ABDOMINAL HYSTERECTOMY  1990   APPENDECTOMY  1965   CESAREAN SECTION  1980; 1985   CHOLECYSTECTOMY  1982   DILATION AND CURETTAGE OF UTERUS  1990   FOOT SURGERY  1992   KNEE ARTHROSCOPY Right 1992   TONSILLECTOMY  1959   TOTAL SHOULDER ARTHROPLASTY Left 11/25/2012   Procedure: TOTAL SHOULDER ARTHROPLASTY;  Surgeon: Eva Elsie Herring, MD;  Location: [facility] OR;  Service: Orthopedics;  Laterality: Left;  LEFT TOTAL SHOULDER ARTHROPLASTY   TOTAL SHOULDER ARTHROPLASTY Right 12/22/2013   TOTAL SHOULDER ARTHROPLASTY Right 12/22/2013   Procedure: TOTAL SHOULDER ARTHROPLASTY;  Surgeon: Eva Elsie Herring, MD;  Location: [facility] OR;  Service: Orthopedics;  Laterality: Right;  Right shoulder total arthroplasty   TUBAL LIGATION  1985   Patient Active Problem List   Diagnosis Date Noted   Visceral obesity 01/13/2022   Other fatigue 01/13/2022   Other specified hypothyroidism 01/13/2022   Primary osteoarthritis involving multiple joints 01/13/2022   Class 1 obesity with serious comorbidity and  body mass index (BMI) of 32.0 to 32.9 in adult 01/13/2022   Arthritis of shoulder region, right, degenerative 12/22/2013   Osteoarthritis of right shoulder 08/09/2013   Right tennis elbow 08/09/2013   Status post total shoulder replacement 11/26/2012   Fibromyalgia 08/15/2010   Chronic pain 08/15/2010   HYPOTHYROIDISM 05/15/2009   HYPERTENSION 05/15/2009   ALLERGIC RHINITIS 05/15/2009   DYSURIA 05/15/2009   History of colonic polyps 05/15/2009    PCP: Dr Carlin Dale Gull  REFERRING PROVIDER: Dr Eva Herring   REFERRING DIAG: L shoulder pain   Rationale for Evaluation and Treatment: Rehabilitation  THERAPY DIAG:  Acute pain of left shoulder  Other symptoms and signs involving the musculoskeletal system  Muscle weakness (generalized)  Abnormal posture  ONSET DATE: 08/16/23  SUBJECTIVE:  SUBJECTIVE STATEMENT: Patient reports 7/10 pain along medial forearm, states she felt fine when she left PT on Monday and all day Tuesday but woke up Wednesday with significant pain in wrist. Patient states she has a throbbing pain along medial forearm/wrist and stabbing pain when turning forearm (pronate/supinate). Patient states her shoulder feels fine, no pain; her primary complaint is the wrist/thumb pain that is same as at the eval but worse.   EVAL: Patient reports that she had a L shoulder replacement 2014 and has had no problems until the past two years. She fractured the L elbow catching a pot. She was placed in soft cast and she thought the elbow was healed. She is now having intense pain in the L wrist and hand. She had pain since the summer with no known injury. Shoulder feels like it did before the shoulder replacement.  Addendum: She has bad arthritis in the L thumb and hand. The orthopedist  wanted to give her an injection but she did not want to have an injection. She will have pain in the L thumb with certain movements.   PERTINENT HISTORY:  Bilat shoulder replacements 2014; fracture L elbow 2023; bilat OA knees; obesity; HTN; fibromyalgia; chronic fatigue syndrome;  Patient reports that she fell back on to the arm of her couch and injured her back/tailbone ~ 10/02/22 and has had some persistent pain in the tailbone since that time. She underwent several visits with physical therapy and reported good improvement in symptoms but continued to have some pain and limited ADL's She has increased pain with sitting on hard surfaces or driving. She also notices trouble with balance and feels clumsy, especially going down steps   PAIN:  Are you having pain? Yes: NPRS scale: 7/10; best in past week 0/10; worst in past week 5-6/10  Pain location: L shoulder; L hand Pain description: nagging; aching; sharp; constant  Aggravating factors: lifting weights in the gym but has stopped; stress; household chores; lifting arm to do hair; texting  Relieving factors: massager; meds; heat (ice irritates symptoms)   PRECAUTIONS: Other: bilat TSR    WEIGHT BEARING RESTRICTIONS: No  FALLS:  Has patient fallen in last 6 months? 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  DIAGNOSTIC FINDINGS:  No recent imaging of LB available   PATIENT SURVEYS:  Quick DASH 81.8/100;  81.8%   SENSATION: Intermittent numbness L ring and littler fingers on an intermittent basis x 2 in the past week resolves in 30 sec   POSTURE:  Patient presents with head forward posture with increased thoracic kyphosis; shoulders rounded and elevated; scapulae abducted and rotated along the thoracic spine; head of the humerus anterior in orientation.   PALPATION: Muscular tightness L lateral cervical musculature; pecs; upper trap; leveator; periscapular musculature; teres/lats; biceps; deltoid; forearm flexors   CERVICAL ROM:   AROM eval  Flexion 61  Extension 29  Right lateral flexion 34 tight  Left lateral flexion 20 pain  Right rotation 39 tight  Left rotation 51   (Blank rows = not tested)  UPPER EXTREMITY ROM:  Active ROM Right eval Left eval  Shoulder flexion 130 81 pain  Shoulder extension 60 54 sore  Shoulder abduction 138 104 pain  Shoulder adduction    Shoulder extension    Shoulder internal rotation Hand to waist  Hand lateral hip   Shoulder external rotation 60 58  Elbow flexion    Elbow extension    Wrist flexion    Wrist extension    Wrist ulnar deviation    Wrist radial deviation    Wrist pronation    Wrist supination     (Blank rows = not tested)   UPPER EXTREMITY MMT: strength not assessed resistively due to pain   MMT Right eval Left eval  Shoulder flexion    Shoulder extension    Shoulder abduction    Shoulder adduction    Shoulder extension    Shoulder internal rotation    Shoulder external rotation    Middle trapezius    Lower trapezius    Elbow flexion    Elbow extension    Wrist flexion    Wrist extension    Wrist ulnar deviation    Wrist radial deviation    Wrist pronation    Wrist supination    Grip strength     (Blank rows = not tested)  SPECIAL TESTS:    Positive neural tension test L UE    [facility] Adult PT Treatment:                                                DATE: 01/06/2024 Manual Therapy: Gentle STM/TPR wrist  flexors IASTM wrist flexors Neuromuscular re-ed: Isometric thumb flexion, abd, extension Thumb extension & abd + rubber band for resistance Therapeutic Activity: Pinch grip to tolerance Finger flexion (fist) Wrist flexor stretch with opp hand --> palm braced on table (weight shifting back to tolerance)   [facility] Adult PT Treatment:                                                DATE: 01/04/24 Therapeutic Exercise: Wrist prayer stretch 4x15sec Towel wring flex/ext; x10 cues for elbow positioning (more comfortable with elbow flexed) Pinch grip iso w/ towel; 3 sec hold x10  Pincer grip iso w/ towel; 3 sec hold x10 (elbow flexed, pronated)  HEP update + education/discussion, handout, discussed relevant anatomy/physiology and rationale for interventions  Manual Therapy: Supported; STM to L volar aspect of forearm, trigger point release wrist flexors; CMC distraction grade 1-2, multiplanar distraction/mobs grade 1-2; trigger point release and STM brachioradialis    [facility] Adult PT Treatment:                                                DATE: 12/31/23 Therapeutic Exercise: Scap squeeze/chest lift sitting with noodle 5 sec x 5 L UE supported on one pillow  Manual Therapy: STM; myofacial release; TPR L upper quarter Manual cervical traction Neural mobilization L UE in supine  Manual work L thumb/thenar eminence; distal wrist  TPR L webspace  PROM L elbow into extension positioning to avoid neural tightness Neuromuscular re-ed: Sitting  Scap squeeze with coregeous ball at T-spine  Chin tuck 5 sec x 10  Supine  Chin tuck 5 sec x 10  Nodding yes/no Scap squeeze  Therapeutic Activity: Postural correction in sitting and standing  Self Care: Avoid use of UE's in gym for resistive exercises until symptoms are more resolved  Ball release standing  Coregeous ball sitting  Cool comfort CMC splint                                                                                                            PATIENT EDUCATION:  Education details: Updated HEP  Person educated: Patient Education method: Explanation, Demonstration, Tactile cues, Verbal cues, and Handouts Education comprehension: verbalized understanding, returned demonstration, verbal cues required, tactile cues required, and needs further education  HOME EXERCISE PROGRAM: Access Code: [email or web address] URL: [email or web address] Date: 01/06/2024 Prepared by: Lamarr Price  Exercises - Supine Cervical Retraction with Towel  - 2 x daily - 7 x weekly - 1 sets - 5-10 reps - 5-10 sec  hold - Supine Scapular Retraction  - 2 x daily - 7 x weekly - 1 sets - 5-10 reps - 5-10 sec  hold - Supine Elbow Extension Stretch in Supination  - 2 x daily - 7 x weekly - 1 sets - 2 reps - 30-60 sec  hold - Seated Scapular Retraction  - 2 x daily - 7 x weekly - 1-2 sets - 5 reps - 5- sec  hold - Seated Cervical Retraction  - 2 x daily - 7 x weekly - 1-2 sets - 5-10 reps - 10 sec  hold - Key Pinch with Putty  - 1 x daily - 7 x weekly - 2-3 sets - 8-10 reps - Tip Pinch with Putty  - 1 x daily - 7 x weekly - 2-3 sets - 8-10 reps - Seated Thumb Abduction with Resistance  - 1 x daily - 7 x weekly - 2-3 sets - 8-10 reps - Seated Thumb Extension with Resistance  - 1 x daily - 7 x weekly - 2-3 sets - 8-10 reps  ASSESSMENT:  CLINICAL IMPRESSION: Weakness noted with resisted thumb abduction; exercise added to HEP. Significant trigger points noted with palpitation along wrist flexors; decreased symptoms reported during trigger point release with movement. Patient did not tolerate lightly resisted thumb flexion, however tolerated extension and abduction/adduction well with light resistance.   Eval: Patient is a 78 y.o. female who was seen today for physical therapy evaluation and treatment for L shoulder and UE pain and dysfunction. Patient reports increasing symptoms L shoulder and arm to wrist and hand over the past 4 months with no known  injury. Patient has poor posture and alignment; scapular dyskinesis; decreased shoulder/elbow/hand ROM and function; muscular tightness L upper quarter; positive neural tension test L UE; pain with functional activities and difficulty resting. Patient will benefit from PT to address problems identified. Positive response to intervention in clinic today with  patient reporting pain decreased from 8/10 to 4/10.   12/31/23: Addendum: She has a history of CMC arthritis and has a thumb splint which she uses when symptoms are flared up. Patient has tenderness to palpation in the L thumb including CMC and webspace. She has pain with AROM L thumb.  GOALS: Goals reviewed with patient? Yes  SHORT TERM GOALS: Target date: 01/19/2024   Independent in initial HEP  Baseline: Goal status: INITIAL  2.  Demonstrate and verbalize proper transfers and transitional movements and body mechanics to avoid irritation of L upper quarter  Baseline:  Goal status: INITIAL  3.  Patient demonstrates and verbalizes appropriate sitting and sleeping positions for shoulder and UE position of ease  Baseline:  Goal status: INITIAL  4.  Patient demonstrates ability to perform 20-30 min of exercise for current symptoms with no increase in pain  Baseline:  Goal status: INITIAL   LONG TERM GOALS: Target date: 02/16/2024   Decrease L UE pain and radicular symptoms by 75-90% allowing patient to return to normal functional activities  Baseline:  Goal status: INITIAL  2.  Increase L shoulder AROM by 10-15 deg in shoulder flexion and scaption with minimal to no pain  Baseline:  Goal status: INITIAL  3.  Patient reports full resolution of radicular pain L UE to return to normal ADL's  Baseline:  Goal status: INITIAL  4.  Patient reports and demonstrates ability to return to household chores and activities  Baseline:  Goal status: INITIAL  5.  Improve Quick DASH score by 10% indicating improved functional activity  level  Baseline:  Goal status: INITIAL  6.  Independent in HEP including return to gym program  Baseline:  Goal status: INITIAL  PLAN:  PT FREQUENCY: 2x/week  PT DURATION: 8 weeks  PLANNED INTERVENTIONS: 97164- PT Re-evaluation, 97110-Therapeutic exercises, 97530- Therapeutic activity, 97112- Neuromuscular re-education, 97535- Self Care, [id number]- Manual therapy, [phone]- Gait training, [phone]- Aquatic Therapy, Patient/Family education, Balance training, Stair training, Taping, and Joint mobilization.  PLAN FOR NEXT SESSION: review and progress exercises; continue with spine care and ergonomic education; manual work and modalities as indicated    Lamarr Price, PTA 01/06/2024 4:24 PM

## 2024-01-12 ENCOUNTER — Ambulatory Visit: Admitting: Rehabilitative and Restorative Service Providers"

## 2024-01-12 ENCOUNTER — Encounter: Payer: Self-pay | Admitting: Rehabilitative and Restorative Service Providers"

## 2024-01-12 DIAGNOSIS — M25512 Pain in left shoulder: Secondary | ICD-10-CM | POA: Diagnosis not present

## 2024-01-12 DIAGNOSIS — M6281 Muscle weakness (generalized): Secondary | ICD-10-CM

## 2024-01-12 DIAGNOSIS — R29898 Other symptoms and signs involving the musculoskeletal system: Secondary | ICD-10-CM

## 2024-01-12 DIAGNOSIS — R293 Abnormal posture: Secondary | ICD-10-CM

## 2024-01-12 NOTE — Therapy (Addendum)
 " OUTPATIENT PHYSICAL THERAPY SHOULDER TREATMENT PHYSICAL THERAPY DISCHARGE SUMMARY  Visits from Start of Care: 7  Current functional level related to goals / functional outcomes: See progress note for discharge status    Remaining deficits: Continued intermittent pain    Education / Equipment: HEP    Patient agrees to discharge. Patient goals were partially met. Patient is being discharged due to not returning since the last visit.  Kyser Wandel P. Ina PT, MPH 02/22/2024 1:50 PM     Patient Name: Sally [name] MRN: [id number] DOB:[birth year], 78 y.o., female Today's Date: 01/12/2024  END OF SESSION:  PT End of Session - 01/12/24 1409     Visit Number 7    Number of Visits 16    Date for Recertification  02/16/24    Authorization Type medicare a & b; bc[employer] federal secondary    Progress Note Due on Visit 10    PT Start Time 1405   late for appointment   PT Stop Time 1445    PT Time Calculation (min) 40 min    Activity Tolerance Patient tolerated treatment well           Past Medical History:  Diagnosis Date   Allergy    Arthritis    all over   Chronic pain syndrome    Chronic UTI    Fibromyalgia    Hx of colonic polyps    Hypertension    Hypothyroidism    Obesity    Pre-diabetes    barely (12/22/2013)   Past Surgical History:  Procedure Laterality Date   ABDOMINAL HYSTERECTOMY  1990   APPENDECTOMY  1965   CESAREAN SECTION  1980; 1985   CHOLECYSTECTOMY  1982   DILATION AND CURETTAGE OF UTERUS  1990   FOOT SURGERY  1992   KNEE ARTHROSCOPY Right 1992   TONSILLECTOMY  1959   TOTAL SHOULDER ARTHROPLASTY Left 11/25/2012   Procedure: TOTAL SHOULDER ARTHROPLASTY;  Surgeon: Eva Elsie Herring, MD;  Location: [facility] OR;  Service: Orthopedics;  Laterality: Left;  LEFT TOTAL SHOULDER ARTHROPLASTY   TOTAL SHOULDER ARTHROPLASTY Right 12/22/2013   TOTAL SHOULDER ARTHROPLASTY Right 12/22/2013   Procedure: TOTAL SHOULDER ARTHROPLASTY;  Surgeon: Eva Elsie Herring, MD;  Location: [facility] OR;  Service: Orthopedics;  Laterality: Right;  Right shoulder total arthroplasty   TUBAL LIGATION  1985   Patient Active Problem List   Diagnosis Date Noted   Visceral obesity 01/13/2022   Other fatigue 01/13/2022   Other specified hypothyroidism 01/13/2022   Primary osteoarthritis involving multiple joints 01/13/2022   Class 1 obesity with serious comorbidity and body mass index (BMI) of 32.0 to 32.9 in adult 01/13/2022   Arthritis of shoulder region, right, degenerative 12/22/2013   Osteoarthritis of right shoulder 08/09/2013   Right tennis elbow 08/09/2013   Status post total shoulder replacement 11/26/2012   Fibromyalgia 08/15/2010   Chronic pain 08/15/2010   HYPOTHYROIDISM 05/15/2009   HYPERTENSION 05/15/2009   ALLERGIC RHINITIS 05/15/2009   DYSURIA 05/15/2009   History of colonic polyps 05/15/2009    PCP: Dr Carlin Dale Gull  REFERRING PROVIDER: Dr Eva Herring   REFERRING DIAG: L shoulder pain   Rationale for Evaluation and Treatment: Rehabilitation  THERAPY DIAG:  Acute pain of left shoulder  Other symptoms and signs involving the musculoskeletal system  Muscle weakness (generalized)  Abnormal posture  ONSET DATE: 08/16/23  SUBJECTIVE:  SUBJECTIVE STATEMENT: Patient reports significant increased in L thumb and hand since last therapy visit. 6/10 today since she has rested the hand and used ice. Pain along medial forearm. Patient states she has a throbbing pain along medial forearm/wrist and stabbing pain when turning forearm (pronate/supinate) has persisted. Patient states her shoulder feels ok with continued pain in wrist/thumb pain.  EVAL: Patient reports that she had a L shoulder replacement 2014 and has had no problems until the past two  years. She fractured the L elbow catching a pot. She was placed in soft cast and she thought the elbow was healed. She is now having intense pain in the L wrist and hand. She had pain since the summer with no known injury. Shoulder feels like it did before the shoulder replacement.  Addendum: She has bad arthritis in the L thumb and hand. The orthopedist wanted to give her an injection but she did not want to have an injection. She will have pain in the L thumb with certain movements.   PERTINENT HISTORY:  Bilat shoulder replacements 2014; fracture L elbow 2023; bilat OA knees; obesity; HTN; fibromyalgia; chronic fatigue syndrome;  Patient reports that she fell back on to the arm of her couch and injured her back/tailbone ~ 10/02/22 and has had some persistent pain in the tailbone since that time. She underwent several visits with physical therapy and reported good improvement in symptoms but continued to have some pain and limited ADL's She has increased pain with sitting on hard surfaces or driving. She also notices trouble with balance and feels clumsy, especially going down steps   PAIN:  Are you having pain? Yes: NPRS scale: 7/10 thumb; shoulder 2/10  Pain location: L shoulder; L hand Pain description: nagging; aching; sharp; constant  Aggravating factors: lifting weights in the gym but has stopped; stress; household chores; lifting arm to do hair; texting  Relieving factors: massager; meds; heat (ice irritates symptoms)   PRECAUTIONS: Other: bilat TSR    WEIGHT BEARING RESTRICTIONS: No  FALLS:  Has patient fallen in last 6 months? 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; gym 3 days/wk strength training with machines and free weights; church; helps with great grand  daughter; reading; sewing   PATIENT GOALS: get rid of the L shoulder and hand pain   NEXT MD VISIT: after therapy if no better   OBJECTIVE:  Note: Objective measures were completed at Evaluation unless otherwise noted.  DIAGNOSTIC FINDINGS:  No recent imaging of LB available   PATIENT SURVEYS:  Quick DASH 81.8/100; 81.8%   SENSATION: Intermittent numbness L ring and littler fingers on an intermittent basis x 2 in the past week resolves in 30 sec   POSTURE:  Patient presents with head forward posture with increased thoracic kyphosis; shoulders rounded and elevated; scapulae abducted and rotated along the thoracic spine; head of the humerus anterior in orientation.   PALPATION: Muscular tightness L lateral cervical musculature; pecs; upper trap; leveator; periscapular musculature; teres/lats; biceps; deltoid; forearm flexors   CERVICAL ROM:   AROM eval  Flexion 61  Extension 29  Right lateral flexion 34 tight  Left lateral flexion 20 pain  Right rotation 39 tight  Left rotation 51   (Blank rows = not tested)  UPPER EXTREMITY ROM:  Active ROM Right eval Left eval  Shoulder flexion 130 81 pain  Shoulder extension 60 54 sore  Shoulder abduction 138 104 pain  Shoulder adduction    Shoulder extension    Shoulder internal rotation Hand to waist  Hand lateral hip   Shoulder external rotation 60 58  Elbow flexion    Elbow extension    Wrist flexion    Wrist extension    Wrist ulnar deviation    Wrist radial deviation    Wrist pronation    Wrist supination     (Blank rows = not tested)   UPPER EXTREMITY MMT: strength not assessed resistively due to pain   MMT Right eval Left eval  Shoulder flexion    Shoulder extension    Shoulder abduction    Shoulder adduction    Shoulder extension    Shoulder internal rotation    Shoulder external rotation    Middle trapezius    Lower trapezius    Elbow flexion    Elbow extension    Wrist flexion    Wrist extension     Wrist ulnar deviation    Wrist radial deviation    Wrist pronation    Wrist supination    Grip strength     (Blank rows = not tested)  SPECIAL TESTS:    Positive neural tension test L UE    [facility] Adult PT Treatment:                                                DATE: 01/12/24 Therapeutic Exercise: Scap squeeze/chest lift sitting with noodle 5 sec x 5 L UE supported on one pillow  Manual Therapy: STM; myofacial release; TPR L upper quarter Manual cervical traction Neural mobilization L UE in supine  PROM L elbow into extension positioning to avoid neural tightness Neuromuscular re-ed: Sidelying R sidelying  Scap squeeze L scapula pulling down and back x 10 x 2  Shoulder circles x 10   Supine  Chin tuck 5 sec x 10  Nodding yes/no Scap squeeze  Therapeutic Activity: Postural correction in sitting and standing  Self Care: Avoid use of UE's in gym for resistive exercises until symptoms are more resolved  Ball release standing  Coregeous ball sitting  Cool comfort CMC splint    [facility] Adult PT Treatment:                                                DATE: 01/06/2024 Manual Therapy: Gentle STM/TPR wrist flexors IASTM wrist flexors Neuromuscular re-ed: Isometric thumb flexion, abd, extension Thumb extension & abd + rubber band for resistance Therapeutic Activity: Pinch grip to tolerance Finger flexion (fist) Wrist flexor stretch with opp hand --> palm braced on table (weight shifting back to tolerance)   [facility] Adult PT Treatment:                                                DATE: 01/04/24 Therapeutic Exercise: Wrist prayer stretch 4x15sec Towel wring flex/ext; x10 cues for elbow positioning (more comfortable with elbow flexed) Pinch grip iso w/ towel;  3 sec hold x10  Pincer grip iso w/ towel; 3 sec hold x10 (elbow flexed, pronated)  HEP update + education/discussion, handout, discussed relevant anatomy/physiology and rationale for interventions  Manual  Therapy: Supported; STM to L volar aspect of forearm, trigger point release wrist flexors; CMC distraction grade 1-2, multiplanar distraction/mobs grade 1-2; trigger point release and STM brachioradialis   [facility] Adult PT Treatment:                                                DATE: 12/31/23 Therapeutic Exercise: Scap squeeze/chest lift sitting with noodle 5 sec x 5 L UE supported on one pillow  Manual Therapy: STM; myofacial release; TPR L upper quarter Manual cervical traction Neural mobilization L UE in supine  Manual work L thumb/thenar eminence; distal wrist  TPR L webspace  PROM L elbow into extension positioning to avoid neural tightness Neuromuscular re-ed: Sitting  Scap squeeze with coregeous ball at T-spine  Chin tuck 5 sec x 10  Supine  Chin tuck 5 sec x 10  Nodding yes/no Scap squeeze  Therapeutic Activity: Postural correction in sitting and standing  Self Care: Avoid use of UE's in gym for resistive exercises until symptoms are more resolved  Ball release standing  Coregeous ball sitting  Cool comfort CMC splint                                                                                                            PATIENT EDUCATION:  Education details: Updated HEP  Person educated: Patient Education method: Explanation, Demonstration, Tactile cues, Verbal cues, and Handouts Education comprehension: verbalized understanding, returned demonstration, verbal cues required, tactile cues required, and needs further education  HOME EXERCISE PROGRAM: Access Code: [email or web address] URL: [email or web address] Date: 01/06/2024 Prepared by: Lamarr Price  Exercises - Supine Cervical Retraction with Towel  - 2 x daily - 7 x weekly - 1 sets - 5-10 reps - 5-10 sec  hold - Supine Scapular Retraction  - 2 x daily - 7 x weekly - 1 sets - 5-10 reps - 5-10 sec  hold - Supine Elbow Extension Stretch in Supination  - 2 x daily - 7 x weekly - 1 sets - 2 reps - 30-60 sec   hold - Seated Scapular Retraction  - 2 x daily - 7 x weekly - 1-2 sets - 5 reps - 5- sec  hold - Seated Cervical Retraction  - 2 x daily - 7 x weekly - 1-2 sets - 5-10 reps - 10 sec  hold   ASSESSMENT:  CLINICAL IMPRESSION: Flare up of L thumb pain with resistive exercises. Will hold all resistive and ROM exercises for L thumb. Patient reports continued gradual improvement in L shoulder, elbow and arm. She has continued palpable tightness in the cervical and shoulder musculature as well as positive neural tension L UE . Good response to manual work and gentle stretching. Added scapular  retraction and circles in R sidelying with no increase in symptoms.   Eval: Patient is a 78 y.o. female who was seen today for physical therapy evaluation and treatment for L shoulder and UE pain and dysfunction. Patient reports increasing symptoms L shoulder and arm to wrist and hand over the past 4 months with no known injury. Patient has poor posture and alignment; scapular dyskinesis; decreased shoulder/elbow/hand ROM and function; muscular tightness L upper quarter; positive neural tension test L UE; pain with functional activities and difficulty resting. Patient will benefit from PT to address problems identified. Positive response to intervention in clinic today with patient reporting pain decreased from 8/10 to 4/10.   12/31/23: Addendum: She has a history of CMC arthritis and has a thumb splint which she uses when symptoms are flared up. Patient has tenderness to palpation in the L thumb including CMC and webspace. She has pain with AROM L thumb.  GOALS: Goals reviewed with patient? Yes  SHORT TERM GOALS: Target date: 01/19/2024   Independent in initial HEP  Baseline: Goal status: INITIAL  2.  Demonstrate and verbalize proper transfers and transitional movements and body mechanics to avoid irritation of L upper quarter  Baseline:  Goal status: INITIAL  3.  Patient demonstrates and verbalizes  appropriate sitting and sleeping positions for shoulder and UE position of ease  Baseline:  Goal status: INITIAL  4.  Patient demonstrates ability to perform 20-30 min of exercise for current symptoms with no increase in pain  Baseline:  Goal status: INITIAL   LONG TERM GOALS: Target date: 02/16/2024   Decrease L UE pain and radicular symptoms by 75-90% allowing patient to return to normal functional activities  Baseline:  Goal status: INITIAL  2.  Increase L shoulder AROM by 10-15 deg in shoulder flexion and scaption with minimal to no pain  Baseline:  Goal status: INITIAL  3.  Patient reports full resolution of radicular pain L UE to return to normal ADL's  Baseline:  Goal status: INITIAL  4.  Patient reports and demonstrates ability to return to household chores and activities  Baseline:  Goal status: INITIAL  5.  Improve Quick DASH score by 10% indicating improved functional activity level  Baseline:  Goal status: INITIAL  6.  Independent in HEP including return to gym program  Baseline:  Goal status: INITIAL  PLAN:  PT FREQUENCY: 2x/week  PT DURATION: 8 weeks  PLANNED INTERVENTIONS: [id number]- PT Re-evaluation, 97110-Therapeutic exercises, 97530- Therapeutic activity, 97112- Neuromuscular re-education, 97535- Self Care, [id number]- Manual therapy, [phone]- Gait training, [phone]- Aquatic Therapy, Patient/Family education, Balance training, Stair training, Taping, and Joint mobilization.  PLAN FOR NEXT SESSION: review and progress exercises; continue with spine care and ergonomic education; manual work and modalities as indicated    Lamarr Price, PTA 01/12/2024 2:10 PM  "

## 2024-01-14 ENCOUNTER — Ambulatory Visit: Admitting: Rehabilitative and Restorative Service Providers"

## 2024-01-21 ENCOUNTER — Ambulatory Visit: Admitting: Rehabilitative and Restorative Service Providers"

## 2024-01-27 ENCOUNTER — Ambulatory Visit: Admitting: Rehabilitative and Restorative Service Providers"
